# Patient Record
Sex: Male | Born: 1935
Health system: Southern US, Community
[De-identification: ages and names within clinical notes are randomized; demographics above are authoritative.]

## PROBLEM LIST (undated history)

## (undated) DIAGNOSIS — I700 Atherosclerosis of aorta: Secondary | ICD-10-CM

## (undated) DIAGNOSIS — Z9289 Personal history of other medical treatment: Secondary | ICD-10-CM

## (undated) DIAGNOSIS — Z8679 Personal history of other diseases of the circulatory system: Secondary | ICD-10-CM

## (undated) DIAGNOSIS — I6529 Occlusion and stenosis of unspecified carotid artery: Secondary | ICD-10-CM

## (undated) DIAGNOSIS — R1310 Dysphagia, unspecified: Secondary | ICD-10-CM

## (undated) DIAGNOSIS — I2510 Atherosclerotic heart disease of native coronary artery without angina pectoris: Secondary | ICD-10-CM

## (undated) DIAGNOSIS — E785 Hyperlipidemia, unspecified: Secondary | ICD-10-CM

## (undated) DIAGNOSIS — F419 Anxiety disorder, unspecified: Secondary | ICD-10-CM

## (undated) DIAGNOSIS — N2889 Other specified disorders of kidney and ureter: Secondary | ICD-10-CM

## (undated) DIAGNOSIS — G629 Polyneuropathy, unspecified: Secondary | ICD-10-CM

## (undated) DIAGNOSIS — I771 Stricture of artery: Secondary | ICD-10-CM

## (undated) DIAGNOSIS — I150 Renovascular hypertension: Secondary | ICD-10-CM

## (undated) HISTORY — PX: COLONOSCOPY: SHX174

## (undated) HISTORY — DX: Polyneuropathy, unspecified: G62.9

## (undated) HISTORY — DX: Atherosclerosis of aorta: I70.0

## (undated) HISTORY — DX: Hyperlipidemia, unspecified: E78.5

## (undated) HISTORY — DX: Occlusion and stenosis of unspecified carotid artery: I65.29

## (undated) HISTORY — PX: OTHER SURGICAL HISTORY: SHX169

## (undated) HISTORY — DX: Dysphagia, unspecified: R13.10

## (undated) HISTORY — DX: Personal history of other medical treatment: Z92.89

## (undated) HISTORY — DX: Other specified disorders of kidney and ureter: N28.89

## (undated) HISTORY — DX: Renovascular hypertension: I15.0

## (undated) HISTORY — DX: Atherosclerotic heart disease of native coronary artery without angina pectoris: I25.10

## (undated) HISTORY — DX: Anxiety disorder, unspecified: F41.9

## (undated) HISTORY — DX: Stricture of artery: I77.1

## (undated) HISTORY — DX: Personal history of other diseases of the circulatory system: Z86.79

---

## 1999-11-04 HISTORY — PX: CARDIAC CATHETERIZATION: SHX172

## 2000-08-19 ENCOUNTER — Inpatient Hospital Stay (HOSPITAL_COMMUNITY): Admission: EM | Admit: 2000-08-19 | Discharge: 2000-08-21 | Payer: Self-pay | Admitting: Emergency Medicine

## 2000-08-19 ENCOUNTER — Encounter: Payer: Self-pay | Admitting: Emergency Medicine

## 2000-09-08 ENCOUNTER — Encounter (HOSPITAL_COMMUNITY): Admission: RE | Admit: 2000-09-08 | Discharge: 2000-12-07 | Payer: Self-pay | Admitting: Cardiology

## 2000-12-08 ENCOUNTER — Encounter: Admission: RE | Admit: 2000-12-08 | Discharge: 2000-12-09 | Payer: Self-pay | Admitting: Cardiology

## 2001-02-17 ENCOUNTER — Ambulatory Visit (HOSPITAL_COMMUNITY): Admission: RE | Admit: 2001-02-17 | Discharge: 2001-02-18 | Payer: Self-pay | Admitting: Cardiology

## 2002-01-03 ENCOUNTER — Encounter: Admission: RE | Admit: 2002-01-03 | Discharge: 2002-01-03 | Payer: Self-pay | Admitting: Family Medicine

## 2002-01-03 ENCOUNTER — Encounter: Payer: Self-pay | Admitting: Family Medicine

## 2006-05-18 ENCOUNTER — Encounter: Admission: RE | Admit: 2006-05-18 | Discharge: 2006-05-18 | Payer: Self-pay | Admitting: Family Medicine

## 2006-05-21 ENCOUNTER — Encounter: Admission: RE | Admit: 2006-05-21 | Discharge: 2006-05-21 | Payer: Self-pay | Admitting: Family Medicine

## 2006-06-03 HISTORY — PX: RENAL ARTERY STENT: SHX2321

## 2006-06-09 ENCOUNTER — Emergency Department (HOSPITAL_COMMUNITY): Admission: EM | Admit: 2006-06-09 | Discharge: 2006-06-10 | Payer: Self-pay | Admitting: Emergency Medicine

## 2006-06-12 ENCOUNTER — Inpatient Hospital Stay (HOSPITAL_COMMUNITY): Admission: EM | Admit: 2006-06-12 | Discharge: 2006-06-14 | Payer: Self-pay | Admitting: Emergency Medicine

## 2006-06-18 ENCOUNTER — Ambulatory Visit (HOSPITAL_COMMUNITY): Admission: RE | Admit: 2006-06-18 | Discharge: 2006-06-19 | Payer: Self-pay | Admitting: Internal Medicine

## 2006-06-30 ENCOUNTER — Encounter: Admission: RE | Admit: 2006-06-30 | Discharge: 2006-06-30 | Payer: Self-pay | Admitting: Internal Medicine

## 2006-07-03 ENCOUNTER — Ambulatory Visit (HOSPITAL_COMMUNITY): Admission: RE | Admit: 2006-07-03 | Discharge: 2006-07-03 | Payer: Self-pay | Admitting: Internal Medicine

## 2006-09-01 ENCOUNTER — Ambulatory Visit (HOSPITAL_COMMUNITY): Admission: RE | Admit: 2006-09-01 | Discharge: 2006-09-01 | Payer: Self-pay | Admitting: Cardiology

## 2006-09-01 ENCOUNTER — Encounter: Payer: Self-pay | Admitting: Vascular Surgery

## 2006-10-01 ENCOUNTER — Ambulatory Visit (HOSPITAL_COMMUNITY): Admission: RE | Admit: 2006-10-01 | Discharge: 2006-10-01 | Payer: Self-pay | Admitting: Family Medicine

## 2007-06-03 ENCOUNTER — Encounter: Admission: RE | Admit: 2007-06-03 | Discharge: 2007-06-03 | Payer: Self-pay | Admitting: Family Medicine

## 2007-06-14 ENCOUNTER — Ambulatory Visit: Payer: Self-pay | Admitting: Surgery

## 2007-07-26 ENCOUNTER — Encounter: Admission: RE | Admit: 2007-07-26 | Discharge: 2007-07-26 | Payer: Self-pay | Admitting: Orthopedic Surgery

## 2007-07-28 ENCOUNTER — Ambulatory Visit (HOSPITAL_BASED_OUTPATIENT_CLINIC_OR_DEPARTMENT_OTHER): Admission: RE | Admit: 2007-07-28 | Discharge: 2007-07-28 | Payer: Self-pay | Admitting: Orthopedic Surgery

## 2008-03-08 ENCOUNTER — Inpatient Hospital Stay (HOSPITAL_COMMUNITY): Admission: EM | Admit: 2008-03-08 | Discharge: 2008-03-10 | Payer: Self-pay | Admitting: Emergency Medicine

## 2008-03-08 ENCOUNTER — Ambulatory Visit: Payer: Self-pay | Admitting: Emergency Medicine

## 2008-09-28 IMAGING — US US RENAL
1 series · 13 of 25 positions shown · non-contrast
Comparison: CT scan from 05/18/2006.

CLINICAL DATA: Left kidney mass seen on multiple prior CT and MRI studies. The
patient has renal insufficiency and is unable to take intravenous contrast which
has hindered assessment.
TECHNIQUE: Complete ultrasound examination of the urinary tract was performed
including evaluation of the kidneys, renal collecting systems, and urinary
bladder.

[Series 1: unknown · 0.23mm/px · 13 of 53 slices shown]
[im 1/53]
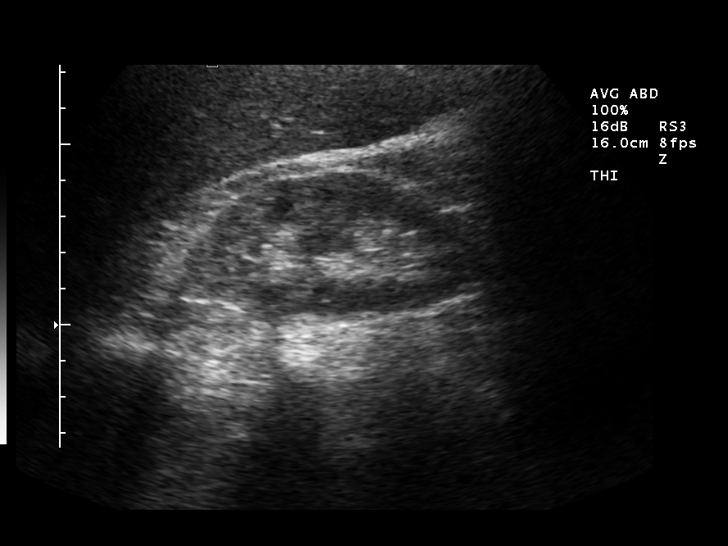
[im 5/53]
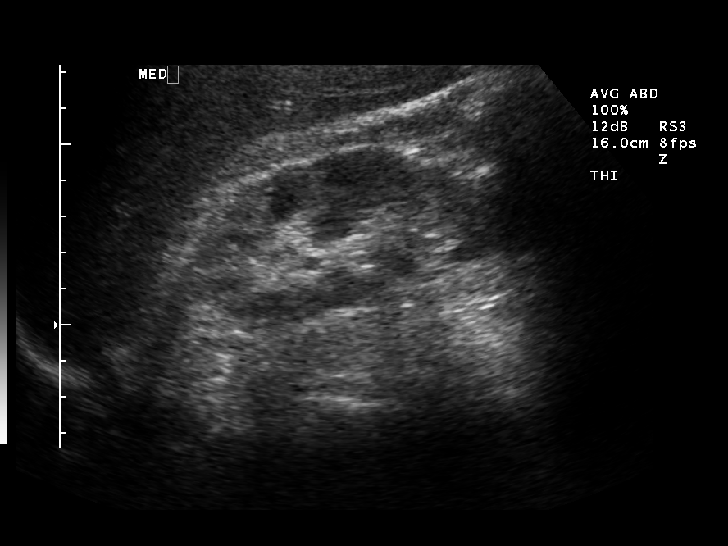
[im 9/53]
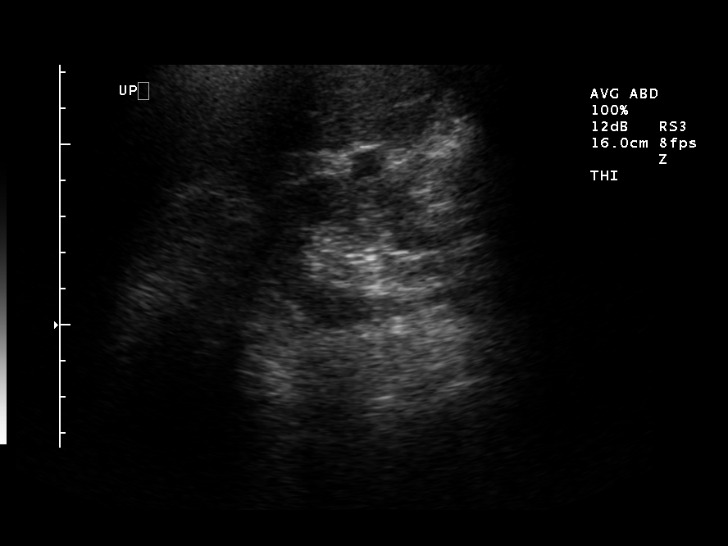
[im 14/53]
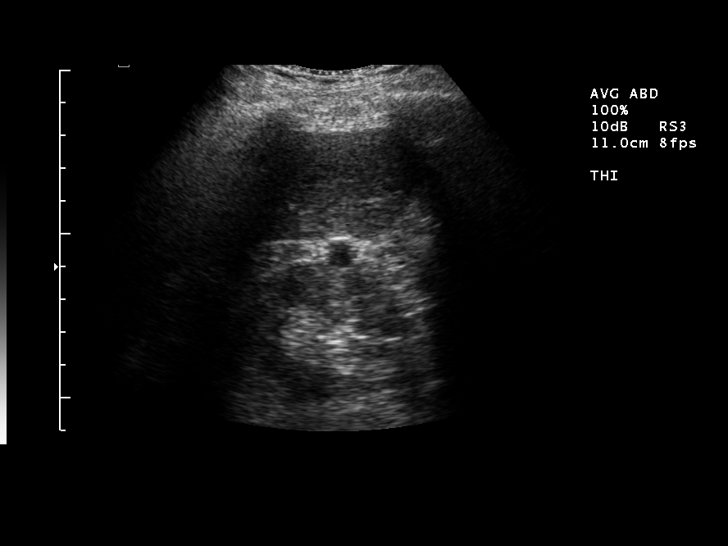
[im 18/53]
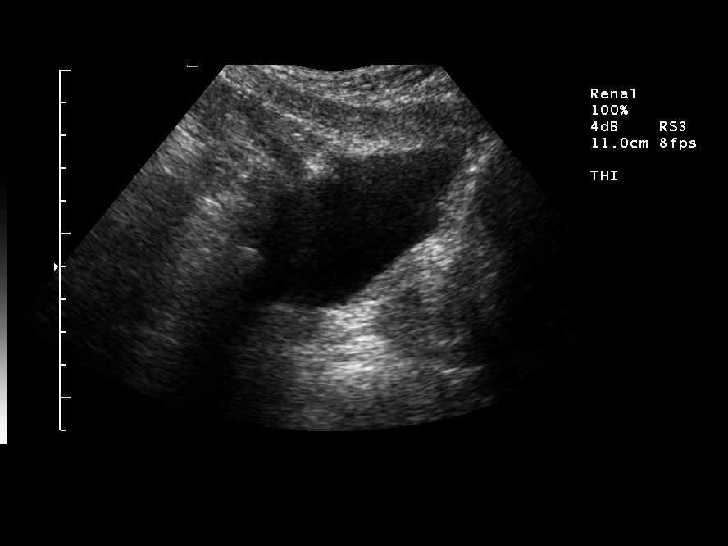
[im 22/53]
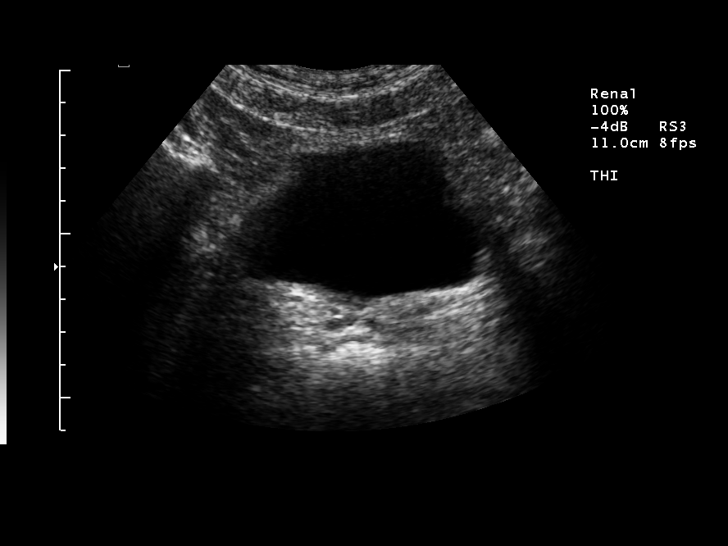
[im 27/53]
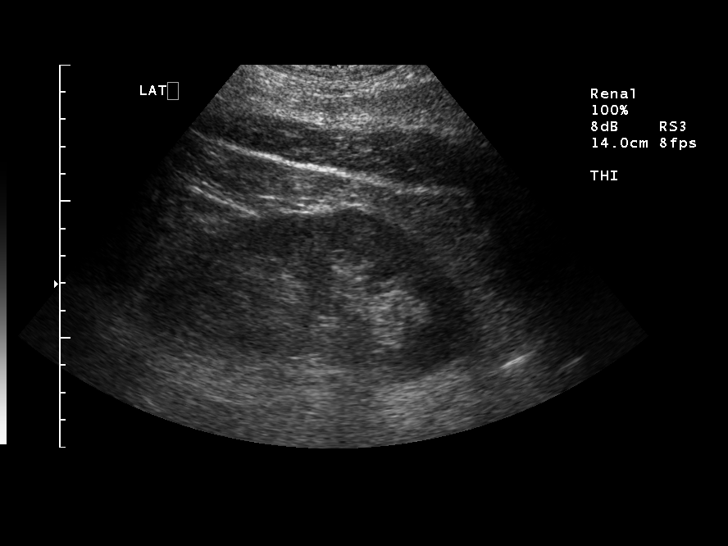
[im 31/53]
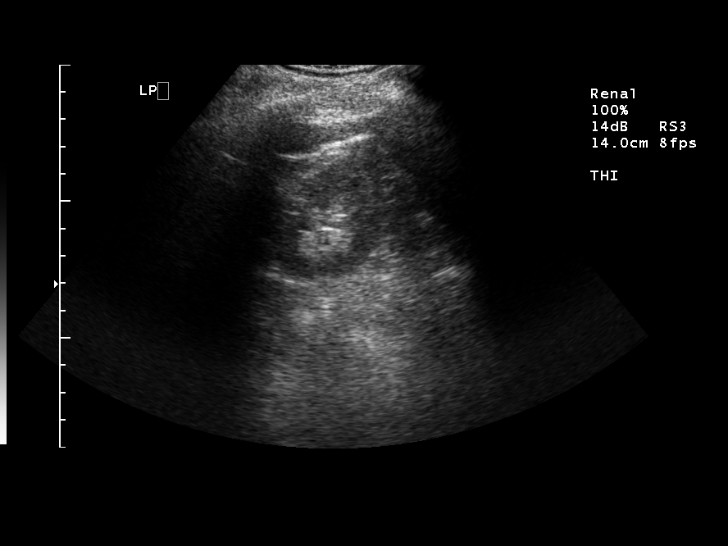
[im 35/53]
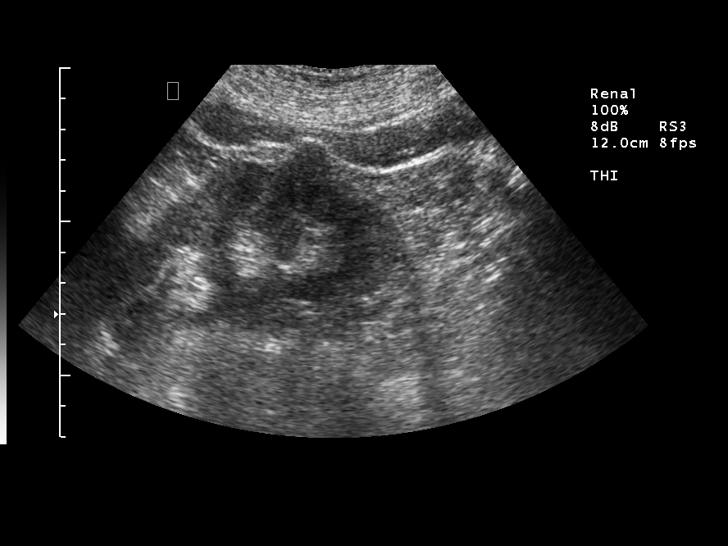
[im 40/53]
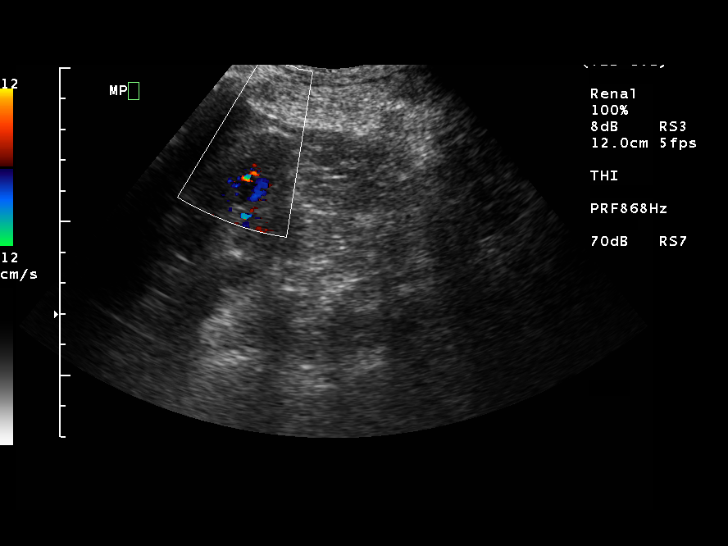
[im 44/53]
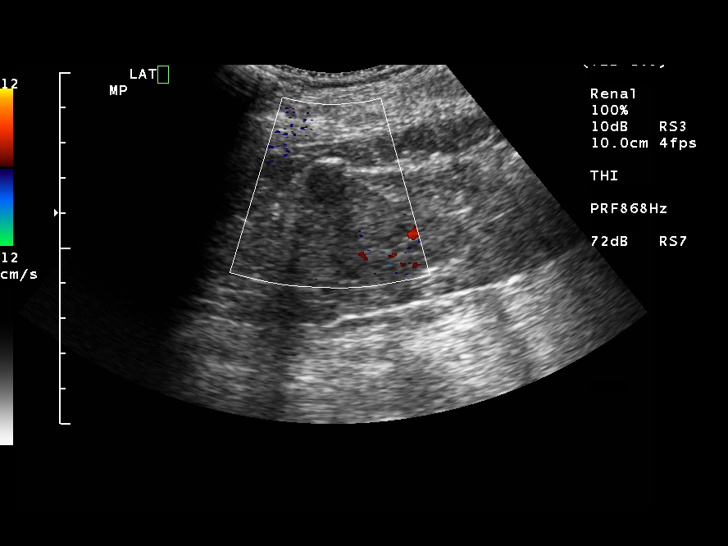
[im 48/53]
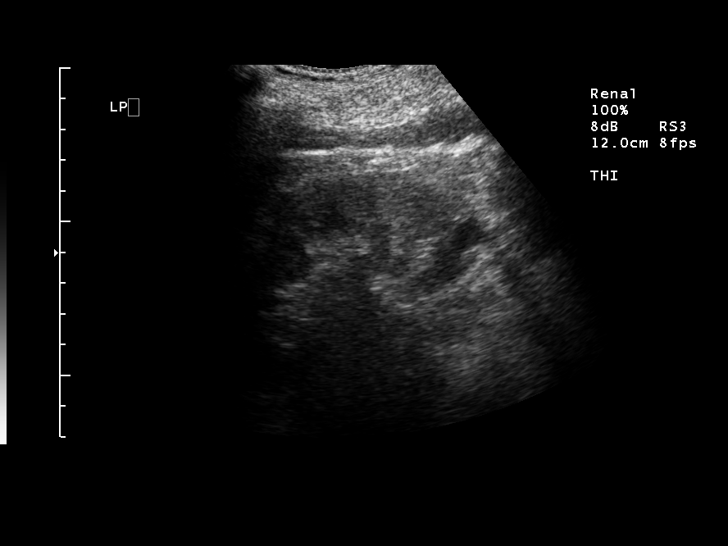
[im 53/53]
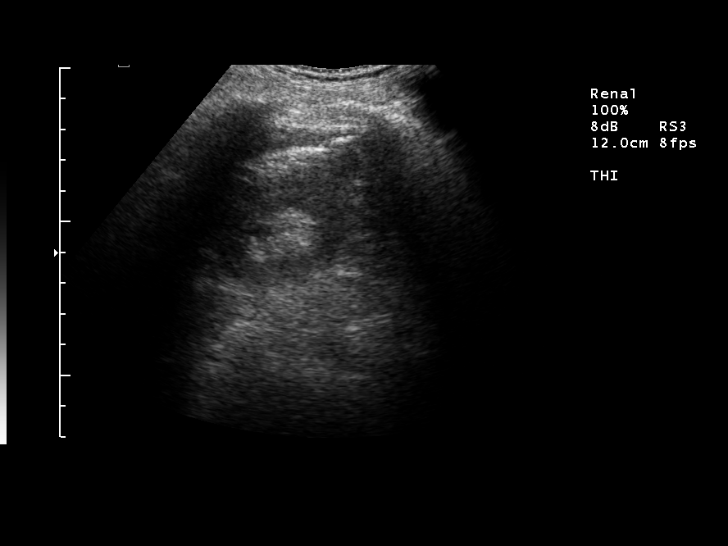

[13 of 25 positions shown; findings below may reference images not displayed]

MRI from 05/21/2006. A PET scan from
07/03/2006 is also been reviewed.

RENAL/URINARY TRACT ULTRASOUND:
FINDINGS: The right kidney measures 10.2 cm in long axis. A 0.9 cm cyst is
identified in the right kidney.

The left kidney measures 11.2 cm in long axis. A 1 cm hypoechoic lesion in the
lower pole left kidney is probably a cyst. The lesion in question is seen along
the lateral margin at the junction of the interpolar and lower pole regions. It
is only minimally hypoechoic relative to the renal parenchyma. The lesion is
well seen on today's study and measures approximately 1.3 cm which is precisely
the measurement report on the CT scan for 05/18/2006. Evaluation with Doppler
today demonstrates no appreciable flow signal within the lesion which is clearly
not hyperemic relative to the background renal parenchyma. However there is no
enhanced through-transmission to suggest that this is a complex cyst.

Midline imaging in the anatomic pelvis demonstrates a nondistended urinary
bladder. The prostate gland is enlarged.
IMPRESSION: 1.3 cm exophytic mass in the left kidney demonstrates no appreciable color
signal on Doppler imaging. However there is no enhanced through-transmission as
often seen in cysts complicated by proteinaceous debris or hemorrhage.
Currently, we have greater than 4 months of imaging followup, back to a CT scan
from 05/18/2006, and the lesion has been stable in that interval. As it is well
seen by ultrasound, a followup ultrasound exam could be performed in 8 to 12
months to ensure that this lesion remains unchanged.

## 2009-01-25 ENCOUNTER — Ambulatory Visit (HOSPITAL_COMMUNITY): Admission: RE | Admit: 2009-01-25 | Discharge: 2009-01-25 | Payer: Self-pay | Admitting: Urology

## 2009-07-02 ENCOUNTER — Encounter: Admission: RE | Admit: 2009-07-02 | Discharge: 2009-07-02 | Payer: Self-pay | Admitting: Cardiology

## 2009-07-12 ENCOUNTER — Ambulatory Visit (HOSPITAL_COMMUNITY): Admission: RE | Admit: 2009-07-12 | Discharge: 2009-07-12 | Payer: Self-pay | Admitting: Interventional Cardiology

## 2010-01-22 ENCOUNTER — Ambulatory Visit: Payer: Self-pay | Admitting: Urology

## 2010-11-24 ENCOUNTER — Encounter: Payer: Self-pay | Admitting: Family Medicine

## 2011-03-18 NOTE — Procedures (Signed)
CAROTID DUPLEX EXAM   INDICATION:  Followup Carotid artery disease.   HISTORY:  Diabetes:  No.  Cardiac:  Coronary artery disease, stent in 2001.  Hypertension:  Yes.  Smoking:  No.  Previous Surgery:  Renal artery PTA on 06/18/2006.  CV History:  Amaurosis Fugax No,  Paresthesias No, Hemiparesis No                                       RIGHT             LEFT  Brachial systolic pressure:         130               140  Brachial Doppler waveforms:         Triphasic         Triphasic  Vertebral direction of flow:        Antegrade         Antegrade  DUPLEX VELOCITIES (cm/sec)  CCA peak systolic                   110               143  ECA peak systolic                   231               191  ICA peak systolic                   101               106  ICA end diastolic                   25                25  PLAQUE MORPHOLOGY:                  Calcified         Calcified  PLAQUE AMOUNT:                      Moderate          Moderate  PLAQUE LOCATION:                    ICA, ECA          ICA, ECA   IMPRESSION:  Bilateral ECA stenoses .  20-39% bilateral ICA stenoses  Study essentially unchanged from 08/30/2004  A preliminary copy was faxed to Dr. Amil Amen office.   ___________________________________________  Janetta Hora. Fields, MD   DP/MEDQ  D:  06/14/2007  T:  06/15/2007  Job:  [id number]

## 2011-03-18 NOTE — Discharge Summary (Signed)
NAMEKRYSTLE, Edward [name] NO.:  [id number]   MEDICAL RECORD NO.:  [id number]          PATIENT TYPE:  INP   LOCATION:  1411                         FACILITY:  [facility]   PHYSICIAN:  Leslye Peer, MD    DATE OF BIRTH:  Jul 13, 1930   DATE OF ADMISSION:  03/08/2008  DATE OF DISCHARGE:  03/10/2008                               DISCHARGE SUMMARY   FINAL DIAGNOSIS:  Status post copperhead bite with envenomation.   LABORATORY DATA:  PTT 50, PT/INR 13.6/1, white blood cell count 5.9,  hemoglobin 10.9, hematocrit 31.2, platelet count 140, quantitive urinary  myoglobin less than 27.  DIC panel negative.   BRIEF HISTORY:  This is a very pleasant 77 year old white male who, the  morning of Mar 08, 2008, was working in his yard and sustained a  copperhead bite on his left index finger.  He developed immediate  swelling and ecchymosis and presented to the [facility] Emergency Room.   HOSPITAL COURSE:  1. Status post copperhead bite with envenomation.  Mr. Madan      presented to the emergency room due to significant hand swelling,      and mild hypotension, and nausea.  It was felt he was beginning to      have systemic effect of the of snake bite envenomation, therefore,      he was given a one-time dose of antivenom in the intensive care.      He was transferred emergently from the ER to the intensive care,      where he could be monitored closely for anaphylaxis.  He tolerated      antivenom infusion without difficulty.  He has had no post      envenomation sequelae.  Upon time of discharge,  his swelling has      improved, the ecchymosis has improved, and the limb mobility has      improved, and the pain has decreased.  He will be discharged to      home with p.r.n. followup with his primary physician.  He was      instructed to resume activity as tolerated, initiate antibiotic      therapy should he develop the following:  Worsening pain, red      streaking, discoloration  of the index finger, purulent discharge or      worsening swelling.  He was given a prescription of Cipro for this,      500 mg p.o. q.12h., and he is instructed to fill was only if these      symptoms were to occur, and then see his primary care physician      that day.  Upon time of discharge, Mr. Moquin appears to have met      maximum benefit from inpatient care.  He will be discharged to home      with followup with his primary care Cruz Bong as needed.   DISCHARGE INSTRUCTIONS:  Diet as tolerated.  Activity as tolerated.  Discharge medications as follows:  Aspirin 325 mg p.o. daily, BuSpar 15  mg twice a day, Toprol XL  100 mg, daily folic acid 400 mg daily, Vytorin  10/40 daily, clonidine 0.1 mg before bed, fish oil 1000 mg twice a day,  Co-Q10 100 mg twice a day.      Zenia Resides, NP      Leslye Peer, MD  Electronically Signed   PB/MEDQ  D:  03/10/2008  T:  03/10/2008  Job:  [id number]

## 2011-03-18 NOTE — H&P (Signed)
Edward [name], Edward [name] NO.:  [id number]   MEDICAL RECORD NO.:  [id number]          PATIENT TYPE:  INP   LOCATION:  1224                         FACILITY:  [facility]   PHYSICIAN:  Leslye Peer, MD    DATE OF BIRTH:  1930-06-05   DATE OF ADMISSION:  03/08/2008  DATE OF DISCHARGE:                              HISTORY & PHYSICAL   CHIEF COMPLAINTS:  Snake bite.   HISTORY OF PRESENT ILLNESS:  This is a very pleasant 77 year old white  male, patient who was out working in his yard at approximately 8:30 this  a.m., moving wood.  At approximately 8:45, he was bitten in the left  index finger by a copperhead.  This was verified by expert  identification, also the gentleman killed the snake and brought the  snake to the hospital for verification.  He reported to the emergency  room at approximately 9:30 a.m.  At that point, he had localized  swelling to the left index finger only.  Since that time, the arm was  elevated and immobilized.  The swelling was progressive with localized  edema noted up to the level of the left wrist, and extending into the  left middle finger.  He also had localized ecchymosis circumferential  around the left index finger, and painful paresthesia, including the  left index finger, the back of the left hand up to the level of the  wrist, and the left middle finger.  He has had mild complaint of nausea,  hypotension with systolic blood pressure down into the 80s, and again  marked throbbing discomfort to the left hand.  [employer]  was contacted, as well as an in depth literature review, including both  up-to-date medical website, and the [employer],  published June 03, 2001, by Lorn Junes, and [place].  The patient at  that point was turned over to the [facility]  Service.  Upon time of evaluation, the patient denies dyspnea, has had  some mild complaint of chest palpitations, has had mild  complaint of  nausea, complains of left hand pain as previously mentioned.  However,  no other pertinent positives.  Treatment today in the emergency room has  included tetanus booster, IV fluids, EKG demonstrating normal sinus  rhythm/sinus brady and arm immobilization.  Based on the recommendations  of up-to-date in the [place] Medical Journal, the arm was  immobilized below the level of the heart, and given evidence of of  systemic affective envenomation, it was decided to transfer him to the  intensive care where he could undergo antivenom administration.   PAST MEDICAL HISTORY:  1. Hypertension.  2. Hyperlipidemia.  3. Coronary artery disease, status post prior stenting.  4. Prior myocardial infarction.  5. Renal artery stenosis, status post stenting.  6. Anxiety.  7. Multiple environmental allergies.   SOCIAL HISTORY:  The gentleman is retired, he has never smoked, and does  not drink alcohol.  He is fully functional.   FAMILY HISTORY:  Not applicable.   ALLERGIES:  PENICILLIN AND ERYTHROMYCIN.   HOME MEDICATIONS:  1. Aspirin 325 mg p.o. daily.  2. BuSpar 15 mg p.o. b.i.d.  3. Toprol XL 100 mg daily.  4. Folic acid 400 mg daily.  5. Vytorin 10/40 daily.  6. Clonidine 0.1 mg b.i.d.  7. Fish oil.  8. Vitamin Q10.  9. Vitamin C.   REVIEW OF SYSTEMS:  Per HPI.   PHYSICAL EXAMINATION:  VITAL SIGNS:  Temperature 97.2, heart rate 60s,  normal sinus.  Blood pressure 102/56, as low as 80s/50s, respirations 16-  20, room air saturations 97%.  GENERAL:  This is a pleasant 77 year old white male, currently mildly  anxious but in no acute distress.  HEENT:  He is normocephalic.  The  mucous member are without petechiae.  There is no scleral hemorrhage and  no bleeding gums.  NECK:  There is no JVD or painful adenopathy.  PULMONARY:  Clear to auscultation.  CARDIAC:  Regular rate and rhythm.  ABDOMEN:  Soft, nontender.  NEUROLOGICALLY:  Grossly intact.  EXTREMITIES:   The left arm is currently immobilized below the level of  the heart.  He has localized ecchymosis circumferential around the left  index finger.  He has mild swelling, including the left middle finger,  and swelling up to the level of the left wrist.  He has marked  ecchymosis circumferentially around the left finger, as well as and  extending over the posterior portion of the left hand.  Additionally, he  has marked paresthesia, including palpation to the left index finger,  mildly to the left middle finger, and over the entire left posterior  portion of the hand.   LABORATORY DATA:  Sodium 138, potassium 4.8, chloride 105, CO2 of 27,  BUN 32, creatinine 1.56, glucose 114, AST 41, ALT 28.  Urinalysis  studies; urine protein and urine blood negative.  CPK total is 119 at  this time.  Fibrinogen 435, PTT is 32, PT/INR is 13/1.  Hemoglobin 12.5,  hematocrit 36.8, white blood cell count 5.5, platelet count 174.   IMPRESSION AND PLAN:  Status post acute copper head snake bite with  evidence of systemic envenomation exhibited by:  1. Local tissue damage, mild hypotension, and nausea.  He also has      marked ecchymosis.  The plan at this point will be to admit to the      intensive care, where he will undergo administration of antivenom,      we will monitor him closely for signs and symptoms of anaphylaxis,      we will treat him now systemically with IV fluid resuscitation, and      hold his antihypertensive regimen medications.  He will be closely      monitored in the intensive care.  The extremity will be marked q.1      h., for the next 12 hours, and we will continue to consult with      Poison Control for further recommendations.  2. History of coronary artery disease and hypertension.  Upon time of      initial evaluation, Mr. Klingensmith is currently mildly hypotensive.      Some concern about this being secondary envenomation.  Therefore,      will hold his maintenance  antihypertensive regimen, administer      antivenom, administer IV fluids, and monitor closely in the      intensive care setting.   BEST PRACTICE:  We will administer H2 blockade for gastric ulcer  prophylaxis.  Additionally, we will administer low molecular weight  heparin  for DVT prophylaxis.      Zenia Resides, NP      Leslye Peer, MD  Electronically Signed    PB/MEDQ  D:  03/08/2008  T:  03/08/2008  Job:  [id number]

## 2011-03-18 NOTE — Op Note (Signed)
NAMESPARROW, SANZO NO.:  [id number]   MEDICAL RECORD NO.:  [id number]          PATIENT TYPE:  AMB   LOCATION:  DSC                          FACILITY:  [facility]   PHYSICIAN:  Leonides Grills, M.D.     DATE OF BIRTH:  24-Feb-1930   DATE OF PROCEDURE:  07/28/2007  DATE OF DISCHARGE:                               OPERATIVE REPORT   PREOPERATIVE DIAGNOSIS:  Left tarsal tunnel syndrome.   POSTOPERATIVE DIAGNOSIS:  Left tarsal tunnel syndrome.   OPERATIONS:  Left tarsal tunnel release.   ANESTHESIA:  General.   SURGEON:  Leonides Grills, M.D.   ASSISTANT:  Evlyn Kanner, P.A.-C.   ESTIMATED BLOOD LOSS:  Minimal.   TOURNIQUET TIME:  Approximately 20 minutes.   COMPLICATIONS:  None.   DISPOSITION:  Stable to the PR.   INDICATIONS:  This is a 77 year old gentleman who has had burning  plantar foot pain with objective evidence of tarsal tunnel syndrome via  EMG that was interfering with his life.  He was consented for the above  procedure.  All risks including infection, nerve or vessel injury,  persistent pain, worsening pain, prolonged recovery, wound complication  were all explained, questions were encouraged and answered.   OPERATION:  The patient was brought to the operating room and placed in  the supine position. After adequate general endotracheal anesthesia was  administered as well as Ancef 1 gram IV piggyback, the patient was then  placed in a sloppy lateral position with the operative side down on a  beanbag.  All bony prominences were well padded.  The left lower  extremity was then prepped and draped in a sterile manner over a  proximally placed thigh tourniquet.  The limb was gravity exsanguinated.  The tourniquet was elevated to 290 mmHg.  A curvilinear incision was  made in the midline on the posteromedial aspect of the ankle.  Dissection was carried down through skin.  Hemostasis was obtained.  The  flexor retinaculum over the neurovascular bundle  was then released  completely as well as the medial portion of the abductor halluces  fascia, as well.  This had excellent release of the tight tarsal  tunnel.  The area was copiously irrigated with normal saline.  The  tourniquet was deflated.  Hemostasis was obtained.  There was no  pulsatile bleeding.  The subcu was closed with 3-0 Vicryl, the skin was  closed with 4-0 nylon.  A sterile dressing was applied, a Cam walker  boot was applied.  The patient was stable to the PR.      Leonides Grills, M.D.  Electronically Signed     PB/MEDQ  D:  07/28/2007  T:  07/28/2007  Job:  [id number]

## 2011-03-21 NOTE — H&P (Signed)
Edward [name], BAIL NO.:  [id number]   MEDICAL RECORD NO.:  [id number]          PATIENT TYPE:  INP   LOCATION:  0106                         FACILITY:  [facility]   PHYSICIAN:  Deirdre Peer. Polite, M.D. DATE OF BIRTH:  1929-11-14   DATE OF ADMISSION:  06/12/2006  DATE OF DISCHARGE:                                HISTORY & PHYSICAL   CHIEF COMPLAINT:  Hypertension, perioral numbness.   HISTORY OF PRESENT ILLNESS:  This 77 year old male with known history of  hypertension and MI in the past, presents to the ED. This is the second  visit in 2 days with complaints of elevated blood pressure and perioral  numbness.  Today patient woke up, did not feel too well, felt weak with no  energy. Throughout the day he began to have perioral numbness.  Upon taking  his blood pressure it was 230/80. The patient presented to the ED for  evaluation. The patient was treated with Clonidine with some improvement.  The patient had a CT of his head reported as negative. CBC within normal  limits. B-MET:  potassium 4.3, sodium 130, otherwise within normal limits.  Because of patient's return visit to the ED, elevation in blood pressure and  neuro symptoms, [facility] was called for further evaluation.  At the  time of my evaluation the patient was alert and oriented x3, in no apparent  distress and describing symptoms as stated above.  Admission was deemed  necessary for further evaluation and treatment.   PAST MEDICAL HISTORY:  1. Hypertension.  2. Myocardial infarction in 2001.  3. History of bilateral renal artery stenosis diagnosed approximately 10      years ago. Patient believes he had angioplasty on the right.   MEDICATIONS:  1. Aspirin 325 daily.  2. BuSpar 15 mg daily.  3. Toprol XL 50 mg daily.  4. Vytorin daily.  5. Folic acid daily.  6. Fish oil.  7. Hydrochlorothiazide 25 mg daily.   SOCIAL HISTORY:  Negative for tobacco, alcohol and drugs.   PAST SURGICAL  HISTORY:  None.   ALLERGIES:  PENICILLIN, ERYTHROMYCIN.   REVIEW OF SYSTEMS:  As stated above in history of present illness. Please  note, the patient has been on and off several blood pressure medications  over the last month or so which include Norvasc, Sular, and Benicar, he  stopped these because of side effects of leg swelling.   PHYSICAL EXAMINATION:  GENERAL:  The patient is alert and oriented x3.  VITAL SIGNS:  Temperature 98, blood pressure 185/80, pulse 57, respirations  18, saturation 97%.  HEENT:  Pupils are equal, round and reactive to light. Anicteric sclerae. No  oral lesions.  NECK:  No lymphadenopathy.  CHEST:  Clear.  CARDIOVASCULAR:  Regular.  ABDOMEN:  Soft, nontender, no hepatosplenomegaly.  EXTREMITIES:  No clubbing, cyanosis or edema.  NEURO EXAM:  Nonfocal. Cranial nerves II-XII intact. Motor exam 5 out of 5.  Deep tendon reflexes symmetrical.   DATA:  As stated in the HPI.   ASSESSMENT:  1. Hypertensive urgency.  2. Perioral numbness.  3.  History of renal artery stenosis.  4. History of myocardial infarction in 2001.   RECOMMENDATIONS:  Patient will be admitted to the nursing floor bed. Patient  will have an MRI of his head to rule out CVA. Will get better blood pressure  control and do a follow up B-MET in the a.m.  Will make further  recommendations as deemed necessary.      Deirdre Peer. Polite, M.D.  Electronically Signed     RDP/MEDQ  D:  06/12/2006  T:  06/12/2006  Job:  [id number]

## 2011-03-21 NOTE — Cardiovascular Report (Signed)
Edward [name]. [facility]  Patient:    Edward [name], Edward [name]                       MRN: [id number] Proc. Date: 02/17/01 Adm. Date:  [id number] Disc. Date: [id number] Attending:  Corliss Marcus CC:         Desma Maxim, M.D.  Cardiac Catheterization Laboratory   Cardiac Catheterization  PROCEDURES PERFORMED: 1. Left heart catheterization. 2. Coronary angiography. 3. Left ventriculogram. 4. Percutaneous transluminal coronary angioplasty/Cutting Balloon proximal    and mid right coronary artery.  INDICATIONS:  Edward [name] is a 77 year old man, who experienced a non-Q-wave myocardial infarction in October of 2001 with subsequent stent implantation in the proximal and midportion of the right coronary artery.  He did well until approximately one month ago when he had recurrent angina.  A recent myocardial perfusion study with exercise stress revealed a large reversible inferior defect, as well as ischemic electrocardiographic changes.  He is brought now to the catheterization laboratory to identify likely re-stenosis and provide for further therapeutic options.  DESCRIPTION OF PROCEDURE:  Left heart catheterization was performed following the percutaneous insertion of a 5 French catheter sheath utilizing an anterior approach over a guiding J wire into the right femoral artery.  A 110 cm pigtail catheter was used to measure pressures as well as perform left ventriculography in a 30 degree RAO angulation.  A total of 45 cc were injected utilizing the power injector.  The 5 [place], #4 left and right Judkins catheters were then used to perform coronary angiography. Cineangiography of each coronary artery was conducted in multiple LAO and RAO projections.  It was apparent there was re-stenosis within the stented segments of the right coronary, as well as just distal to the proximal stent.  We therefore proceeded with percutaneous intervention.  The 5 French catheter  sheath was exchanged over a long guiding J wire for a 7 French catheter sheath.  The patient received 4000 units of heparin intravenously, as well as a double bolus of Integrilin in constant infusion.  Initial ACT was 260 seconds. A 6 French catheter sheath was inserted percutaneously into the right femoral vein for intravenous access.  A 7 [place], FR4, Scimed, wiseguide guiding catheter was advanced to the ascending aorta where the right coronary os was engaged.  A 0.014 inch Scimed, luge, intracoronary guidewire was advanced across the lesion in the proximal and midportion of the right coronary. Initial balloon dilatation was performed with a 3.25/15 mm Scimed, Cutting Balloon.  A total of 6 inflations were performed to a maximum pressure of 8 atmospheres on the mid right coronary stent.  The balloon was then withdrawn to the lesion just distal to the proximal stent.  Three inflations were performed there to 6 atmospheres.  The balloon was then withdrawn further into the proximal portion of the proximal stent and inflated there to a maximum pressure of 10 atmospheres.  Each of these inflations were conducted for 1-1/2 minutes.  At that point, the 3.25/15 mm Cutting Balloon ruptured. It was removed and replaced with a 3.25/10 mm Cutting Balloon.  Three additional inflations were made to 10 atmospheres in the distal stent proximal portion, 1-1/2 minutes each.  Two additional inflations were made at the site of the most significant lesion to 10 atmospheres in the proximal portion of the right coronary artery and a final inflation was made at the ostium to 10 atmospheres with the 3.25/10 mm Cutting Balloon.  Following conformation of adequate patency in orthogonal views, both with and without the guidewire in place, the guiding catheter was removed.  Hemostasis was achieved by suturing the sheath in place.  The patient was transported to the recovery area in stable condition with intact  distal pulse.  A closing ACT was 244 seconds.  HEMODYNAMICS:  Systemic arterial pressure was 156/74 with a mean of 107 mmHg. There was no systolic gradient across the aortic valve.  The left ventricular end-diastolic pressure was 11 mmHg preventriculogram and 9 mmHg post ventriculogram.  ANGIOGRAPHY:  LEFT VENTRICULOGRAM:  The left ventriculogram demonstrated normal global systolic function, a calculated ejection fraction utilizing a single plane cine method of 70%.  The previously placed stents in the right coronary artery were easily visible as well as calcification present in the left coronary artery.  There was no significant mitral regurgitation.  There was a right dominant coronary system present.  The main left coronary artery was normal.  The left anterior descending artery and its branches were moderately diseased. diseased.  The vessel had a diffuse 50% proximal stenosis.  There were four diagonal branches which arose, all which were quite small in nature.  The ongoing LAD is diffusely diseased but without significant obstruction and reached the apex but did not traverse it.  The left circumflex artery and its branches were moderately diseased as well; the vessel had a proximal 40-50% stenosis, this was near the ostium.  There is diffuse disease in the midportion of the vessel after the origin of two very small marginal branches.  The third marginal branch was moderate in size and the forth marginal branch was large.  The ongoing circumflex in the AV groove was small.  It gives rise to a small posterolateral branch.  The degree of stenosis in the midportion of the left circumflex where it is diffusely diseased, is approximately 50%.  The right coronary artery and its branches were highly diseased.  The vessel demonstrated some focal and diffuse in-stent re-stenosis.  The proximal one-half of the proximal stent was 50% stenotic, then just at the distal end  of the  proximal stent, there was a focal 80% stenosis.  The stent placed in the midportion of the right coronary, there was diffuse in-stent re-stenosis of about 50%.  The ongoing vessel gave rise to a large posterior descending artery and a large posterolateral segment and branch.  The final posterolateral or left ventricular branch is moderate in size and there is a 40% stenosis at the origin of the posterior descending artery and at the origin of the large posterolateral branch.  Following Cutting Balloon dilatation in the right coronary artery, there was a 10% residual stenosis in the proximal portion at the site of the previous 80% stenosis only.  FINAL IMPRESSION: 1. Atherosclerotic coronary vascular disease, three-vessel. 2. Status post successful percutaneous transluminal coronary angioplasty,    Cutting Balloon proximal and mid right coronary artery. 3. No significant ischemia in the left circumflex or left anterior descending    distribution by recent Cardiolite. 4. Intact left ventricular size and global systolic function.  Ejection    fraction 70%. 5. Typical angina was produced during device insertion and balloon inflation. DD:  02/17/01 TD:  02/18/01 Job: [id number]

## 2011-03-21 NOTE — Cardiovascular Report (Signed)
[place]. [facility]  Patient:    Edward [name], Edward [name]                       MRN: [id number] Proc. Date: 08/20/00 Adm. Date:  [id number] Attending:  Corliss Marcus CC:         Desma Maxim, M.D.  Cardiac Catheterization Laboratory   Cardiac Catheterization  PROCEDURES PERFORMED: 1. Left heart catheterization. 2. Coronary angiography. 3. Left ventriculogram. 4. Percutaneous transluminal coronary angioplasty/stent implantation,    proximal and mid right coronary artery.  INDICATIONS:  Edward [name] is a 77 year old man who presented to my office on August 13, 2000, because of exertional throat discomfort.  He underwent an exercise treadmill test which was positive for significant ST segment changes, diagnostic for coronary ischemia.  He did not experience throat discomfort at this time.  Outpatient elective cardiac catheterization was arranged. However, on the early morning of August 19, 2000, the patient presented to [facility] Emergency Room via EMS with complaints of rest throat discomfort and mild dyspnea.  He was subsequently admitted with the diagnosis of unstable angina pectoris and has been treated with IV heparin and nitroglycerin.  A troponin level has been minimally positive at 0.67 for coronary ischemic injury.  A CK-MB remain normal.  He is brought to the cardiac catheterization laboratory at this time to identify the extent of disease and provide for further therapeutic options.  DESCRIPTION OF PROCEDURE:  Left heart catheterization was performed following the percutaneous insertion of a 6 French catheter sheath utilizing an anterior approach over a guiding J wire into the right femoral artery.  A 110 cm pigtail catheter was used to measure pressures in the ascending aorta and the a left ventricle as well as a left ventriculography in a 30 degree RAO angulation.  Coronary angiography was then performed using a 6 [place] #4 left and right Judkins  catheters.  Cineangiography of each coronary artery was conducted in multiple LAO and RAO projections.  We then proceeded with an intervention of the right coronary.  There was a proximal 60% stenosis with catheter "pressure damping" upon engagement.  There was a subtotal culprit lesion which was long and ulcerated in the midportion of the vessel.  A 7 French catheter sheath was exchanged over a long guiding J wire for a 6 French catheter sheath in the right femoral artery.  The patient received 4000 units of heparin intravenously which was 50 units per kilogram based on an initial ACT of 166 seconds.  He also received a double bolus of Integrilin in constant infusion.  A 7 French FR4 Sci-Med wiseguide with side holes guiding catheter was advanced to the ascending aorta where the right coronary os was engaged.  A .014 inch Sci-Med luge intracoronary guidewire was advanced across the lesion without difficulty.  Initial balloon dilatation was performed using a 3.0/20 mm Sci-Med Firefighter.  This was placed across the lesion and inflated to a peak pressure of 6 atmospheres for a peak duration of 67 seconds.  This balloon was removed and a 3.0/25 mm Sci-Med NIR Elite intracoronary stent was advanced across the lesion with some difficulty. It required deep intubation of the right coronary to facilitate advancing the stent in place.  It was ultimately deployed at a peak pressure of 14 atmospheres for 63 seconds.  Unfortunately at this point, there was trivial flow distally in the artery and the proximal dissection was recognized.  A 3.0/25 mm Sci-Med NIR  Elite intracoronary stent was placed across this lesion in the proximal portion of the right coronary such that it reached from the ostium to beyond the dissection.  It was initially deployed at 8 atmospheres for 49 seconds.  The balloon was returned once to the stent and positioned in the more proximal segment and inflated to 16  atmospheres for 34 seconds.  This restored excellent distal flow.  ST segment elevation which had been occurring resolved, and patients throat discomfort resolved as well.  Following conformation of adequate patency in orthogonal views both with and without the guidewire in place, the guiding catheter was removed.  Hemostasis was achieved by suturing the sheaths in place.  The patient was transported to the recovery area in stable condition with intact distal pulses.  After initial 4000 unit heparin bolus, the patients ACT was 321 falling to 281 at the completion of the procedure.  HEMODYNAMICS:  Systemic arterial pressure was 125/85 with a mean of 92 mmHg. There was no systolic gradient across the aortic valve.  The left ventricular end-diastolic pressure was 13 mmHg pre and post ventriculogram.  ANGIOGRAPHY:  LEFT VENTRICULOGRAM:  The left ventriculogram demonstrated normal left ventricular size and global systolic function with a calculated ejection fraction of 70% utilizing a plane cine method.  There was no mitral or aortic valvular calcification of significance.  There was right and left coronary calcification seen.  There was no mitral regurgitation.  CORONARY ANGIOGRAPHY:  There was a right dominant coronary system present.  The main left coronary artery was moderately diseased.  There was a distal tapering 30% stenosis.  The left anterior descending:  The left anterior descending artery and its branches were moderately diseased.  The vessel was diffusely diseased and shows a 40-50% diffuse mid vessel stenosis.  Four diagonal branches arise, all of which are quite small in nature.  The ongoing LAD is diffusely diseased but without significant obstruction in reach of the apex but does not traverse it.  Left circumflex:  The left circumflex artery and its branches were moderately diseased.  The vessel has a proximal 40-50% stenosis.  There is diffuse disease in the  midportion of the vessel after the origin of two very small  marginal branches.  The third marginal branch is moderate in size.  The fourth marginal branch is large.  The ongoing circumflex in the AV groove is small. It gives rise to a very small posterolateral branch.  The degree of stenosis in the midportion of the vessel where it is diffusely diseased, again is not greater than 40-50%.  Right coronary artery:  The right coronary artery and its branches are highly diseased.  As mentioned above, there is ostial 60-70% stenosis which caused pressure damping.  There is diffuse disease in the proximal and midportion, 30-40% obstructive, and in the midportion there is a long, ulcerated 90% stenosis.  The vessel then gives rise to a large posterior descending artery and a large posterolateral segment and branch.  There is a final second posterolateral or left ventricular branch which is moderate in size.  There is a 40% stenosis at the origin of the posterior descending artery and at the origin of the large first posterolateral branch.  Following balloon dilatation and stent implantation in the right coronary for the mid section there is no residual stenosis and there is a good "stepup and stepdown" entering and exiting the stent.  There is also wide patency of the proximal segment with no residual stenosis seen.  Collateral vessels were seen to the distal right coronary from the distal left circumflex distribution.  FINAL DIAGNOSES: 1. Atherosclerotic coronary vascular disease, moderate three-vessel. 2. Status post successful percutaneous transluminal coronary angioplasty    stent implantation in the proximal and mid right coronary artery. 3. Typical angina was reproduced with device insertion and balloon    inflation. 4. Intact left ventricular size and global systolic function without    regional wall motion abnormality noted. DD:  08/20/00 TD:  08/21/00 Job: [id number]

## 2011-03-21 NOTE — Consult Note (Signed)
Edward [name], Edward [name] NO.:  [id number]   MEDICAL RECORD NO.:  [id number]          PATIENT TYPE:  OIB   LOCATION:  3038                         FACILITY:  [facility]   PHYSICIAN:  Hollice Espy, M.D.DATE OF BIRTH:  28-Jun-1930   DATE OF CONSULTATION:  06/18/2006  DATE OF DISCHARGE:                                   CONSULTATION   PRIMARY CARE PHYSICIAN:  Donia Guiles, M.D.   REASON FOR CONSULTATION:  Hypertension and previous medical management.   HISTORY OF PRESENT ILLNESS:  The patient is a 77 year old white male well  known to [place] from a recent discharge with a past medical history of  hypertension, bilateral renal artery stenosis and lesion seen on right  kidney suspicious for carcinoma who presents as an overnight observation  following a interventional radiology procedure.  The patient was recently  discharged from the hospital for episodes of elevated high blood pressure.  At that time, he had Clonidine 0.1 p.o. b.i.d. added.  He had a MRA of his  kidneys ordered which the patient had completed prior to discharge.  Rather  he chose to go home given his hypotensive stability rather than wait another  hospital day for the results.  Unfortunately, the next day his results came  back positive for bilateral renal artery stenosis as well as a lesion seen  on his right kidney which again remained suspicious for renal cell  carcinoma.  I spoke with the patient's primary care physician as well as the  patient and discussed with interventional radiology and the patient was  scheduled for a bilateral renal artery stent placement following cerebral  angiogram today, June 18, 2006.  In addition, the patient is scheduled to  have a PET scan done on Monday, June 22, 2006.   The patient today met with Dr. Kearney Hard of interventional radiology and  underwent a renal angiogram and was found to have severely tight bilateral  renal artery stenosis.  He underwent bilateral  stent placement and  immediately his blood pressure started to improve with a blood pressure down  to 117/55.  The patient was monitored for several hours postprocedure and  did well.  He was getting ready to be discharged when a recheck of his blood  pressure found to be elevated.  He was found to have a blood pressure of  209/89.  He was also having symptoms consistent with previous episodes of  high blood pressure where he describes feelings of scalp tingling, oral  numbness and bilateral episodes of blurry vision.   The patient was given a dose of Clonidine 0.1 which his blood pressure  approximately 15 minutes later had come down to only about 198.  Continued  following of the patient with his blood pressure only coming down to another  10 systolic points.  It was felt that the patient likely had an episode of  high blood pressure from possible stress from procedure plus the generalized  stress and anxiety about high blood pressure and his renal lesion.  So, he  was kept in the hospital overnight for observation.  [facility] were  consulted for some general medical management.  The patient had his blood  pressure checked now several hours later and his blood pressure has been  coming down to a systolic in the 180s.  Currently, he is doing well.  He  says that his symptoms of blurry vision, scalp tingling and periorbital  numbness are starting to fade.  He is feeling much better.  He denies any  headaches, dysphagia, chest pain, palpitations, shortness of breath, wheeze,  cough, abdominal pain, hematuria, dysuria, constipation, diarrhea, focal  extremity numbness, weakness or pain.   REVIEW OF SYSTEMS:  Otherwise negative.   PAST MEDICAL HISTORY:  1. Hypertension.  2. Bilateral renal artery stenosis.  3. Lesion seen on the right kidney suspicious for carcinoma.   MEDICATIONS:  1. Currently on Clonidine 0.1 mg b.i.d.  2. BuSpar.  3. Aspirin 325 mg p.o. daily.  4. Toprol  100 mg p.o. daily.  5. Vytorin 10/40 p.o. daily.  6. Folic acid 1 mg p.o. daily.  7. Fish oil.  8. CO Q10.   ALLERGIES:  1. He has allergy to PENICILLIN.  2. ERYTHROMYCIN.  3. Because of his renal artery stenosis ACE INHIBITORS are relatively      contraindicated.   SOCIAL HISTORY:  No tobacco, alcohol or drug use.   FAMILY HISTORY:  Noncontributory.   PHYSICAL EXAMINATION:  VITAL SIGNS:  The patient's most recent set of vitals  show a systolic blood pressure 180/88, respirations 16, temperature  afebrile, pulse 76.  GENERAL:  The patient is alert and oriented x3 in no apparent distress.  HEENT:  Normocephalic and atraumatic.  Mucus membranes are moist.  He has no  carotid bruits.  HEART:  Regular rate and rhythm.  S1 and S2.  LUNGS:  Clear to auscultation bilaterally.  ABDOMEN:  Soft, nontender, and nondistended.  Positive bowel sounds.  I am  unable to appreciate any renal bruits.  EXTREMITIES:  No clubbing, cyanosis, or edema.   ASSESSMENT/PLAN:  1. Severe episode of systolic hypertension secondary to postprocedure      stent placement:  This may be a more stress response with management of      renal vessels and one would expect that his blood pressure should come      down naturally.  In the meantime, we will assess for an immediate stent      placement.  I would like for his blood pressure to stay below 150 so we      will give him an additional dose of Clonidine tonight and then put on      for p.r.n. orders.  He has been reasonably started on Ambien for stress      relief and sleeping management.  We will go ahead and give him one of      those as well.  2. Anxiety:  Continue BuSpar.  3. Hyperlipidemia.  4. Continue Vytorin.  5. Lesion seen on the right kidney:  We will plan for PET scan as      scheduled Monday, June 22, 2006.  In addition, if the patient's blood      pressure remains stable, we will plan for discharge tomorrow and follow     up with his primary  care physician.      Hollice Espy, M.D.  Electronically Signed     SKK/MEDQ  D:  06/18/2006  T:  06/18/2006  Job:  [id number]   cc:   Rolan Bucco L. Kearney Hard, M.D.  Donia Guiles, M.D.

## 2011-08-14 LAB — BASIC METABOLIC PANEL
BUN: 20
Creatinine, Ser: 1.38
Glucose, Bld: 118 — ABNORMAL HIGH

## 2011-08-14 LAB — POCT HEMOGLOBIN-HEMACUE: Operator id: 116011

## 2013-02-01 HISTORY — PX: OTHER SURGICAL HISTORY: SHX169

## 2013-08-04 ENCOUNTER — Other Ambulatory Visit: Payer: Self-pay | Admitting: Family Medicine

## 2013-08-04 DIAGNOSIS — R1310 Dysphagia, unspecified: Secondary | ICD-10-CM

## 2013-08-05 ENCOUNTER — Ambulatory Visit
Admission: RE | Admit: 2013-08-05 | Discharge: 2013-08-05 | Disposition: A | Discharge status: Home / Self Care | Payer: Medicare Other | Source: Ambulatory Visit | Attending: Family Medicine | Admitting: Family Medicine

## 2013-08-05 DIAGNOSIS — R1310 Dysphagia, unspecified: Secondary | ICD-10-CM

## 2013-08-30 ENCOUNTER — Telehealth: Payer: Self-pay | Admitting: Interventional Cardiology

## 2013-08-30 NOTE — Telephone Encounter (Signed)
New message     Want samples of benecar 40mg  and vytorin 10/40.  Pls call him and let him know if we have samples

## 2013-08-30 NOTE — Telephone Encounter (Signed)
Lm on pts machine letting him know that we do not have samples at this time and to call back and check next week.

## 2013-09-05 NOTE — Telephone Encounter (Signed)
Left samples of Vytorin at front desk per patient request, but explained that we did not have samples on Benicar.   Patient states that he has been taking an outdated Amlodipine 5mg  prescription as needed - that he has taken it 5 days straight. Dr. Eldridge Dace office note states that if patient didn't have good control that they may consider adding Amlodipine 2.5mg  and titrating up. Reported BPs:  10/28 am 133/63 10/29 am 125/67 10/30 am 128/59 10/31 am 161/79 11/01 am 135/63 11/02 am 130/60   pm 159/70  pm 141/66  pm 192/88  pm 164/75  pm 139/71  pm 144/74 Patient concerned about his evening pressures.  I explained Dr. Eldridge Dace is out of the office today. I will forward this to Dr. Eldridge Dace and his assist Amy Stegall for follow up with patient. Patient agreeable to plan.

## 2013-09-05 NOTE — Telephone Encounter (Signed)
Following up      Pt would like to renew AMLODIBINE to [facility] [address]    Pt also has concerns about his BP  193/83?  105/60 today.  Pt is also following up on samples BENICAR 40mg   BYTORIN 10/40 was told to call back on this.     Thanks!

## 2013-09-06 MED ORDER — AMLODIPINE BESYLATE 10 MG PO TABS: ORAL_TABLET | ORAL | Status: DC

## 2013-09-06 NOTE — Addendum Note (Signed)
Addended byOrlene Plum H on: 09/06/2013 01:33 PM   Modules accepted: Orders

## 2013-09-06 NOTE — Telephone Encounter (Signed)
Please call in Amlodipine 10 mg tablets.  He can start with a half tab daily.

## 2013-09-06 NOTE — Telephone Encounter (Signed)
Pt notified Rx sent in.

## 2013-09-08 ENCOUNTER — Encounter: Payer: Self-pay | Admitting: Sports Medicine

## 2013-09-08 ENCOUNTER — Ambulatory Visit (INDEPENDENT_AMBULATORY_CARE_PROVIDER_SITE_OTHER): Payer: Medicare Other | Admitting: Sports Medicine

## 2013-09-08 VITALS — BP 164/72 | HR 58 | Ht 70.25 in | Wt 165.0 lb

## 2013-09-08 DIAGNOSIS — M25579 Pain in unspecified ankle and joints of unspecified foot: Secondary | ICD-10-CM | POA: Insufficient documentation

## 2013-09-08 DIAGNOSIS — M205X9 Other deformities of toe(s) (acquired), unspecified foot: Secondary | ICD-10-CM

## 2013-09-08 DIAGNOSIS — M21619 Bunion of unspecified foot: Secondary | ICD-10-CM

## 2013-09-08 DIAGNOSIS — M7750 Other enthesopathy of unspecified foot: Secondary | ICD-10-CM

## 2013-09-08 DIAGNOSIS — M21629 Bunionette of unspecified foot: Secondary | ICD-10-CM | POA: Insufficient documentation

## 2013-09-08 DIAGNOSIS — M7741 Metatarsalgia, right foot: Secondary | ICD-10-CM

## 2013-09-08 NOTE — Assessment & Plan Note (Signed)
Because of his neuropathy we limited placing a metatarsal pad on the plantar surface of the left orthotic

## 2013-09-08 NOTE — Progress Notes (Signed)
  Subjective:    Patient ID: Edward [name], male    DOB: July 22, 1930, 79 y.o.   MRN: [id number]  HPI  Pleasant older man sent from Dr Philipp Deputy for new orthotics  Pt presents to clinic for evaluation of bilateral foot pain and numbness L > R.  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  Had a tarsal tunnel release of lt medial ankle that was not successful in 2007. Has custom orthotics, which have been helpful since 2008. 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   Hx of stents in renal arteries, and heart   Review of Systems     Objective:   Physical Exam  NAD  Foot exam:  Full sublux of lt 5th toe at MTP with overriding of 4th toe Bunionettes rt 5th MTP Small hammer on rt 4th toe Mild hallux deviation and short 1st phalanx bilat Mild supination on lt, neutral on rt  Drop of 3rd and 4th MT heads with abnormal calluses on LT Rt 4th MT head down but still mobile           Assessment & Plan:

## 2013-09-08 NOTE — Assessment & Plan Note (Signed)
The orthotics felt comfortable but if he gets pain over this area we will and at a later time a fifth ray post

## 2013-09-08 NOTE — Assessment & Plan Note (Signed)
He has a somewhat broad-based gait that I think is related to his peripheral neuropathy  I think he gets more foot breakdown because his sensation is not normal and that may account for some of his forefoot pain  His older orthotics are worn pretty badly and I did add some additional cushion to those  Orthotics were prepared and gave him a lot more support  Patient was fitted for a : standard, cushioned, semi-rigid orthotic. The orthotic was heated and afterward the patient stood on the orthotic blank positioned on the orthotic stand. The patient was positioned in subtalar neutral position and 10 degrees of ankle dorsiflexion in a weight bearing stance. After completion of molding, a stable base was applied to the orthotic blank. The blank was ground to a stable position for weight bearing. Size: 11 red EVA Base: blue EVA Posting: MT Additional orthotic padding: none Time 45 mins

## 2013-10-04 ENCOUNTER — Telehealth: Payer: Self-pay | Admitting: Interventional Cardiology

## 2013-10-04 NOTE — Telephone Encounter (Signed)
New problem    Pt called to request med samples of BYTORIN 10-40  Please give him a call back.

## 2013-10-05 NOTE — Telephone Encounter (Signed)
Pt notified that I have put # 28 samples of Vytorin 10-40 mg and Benicar 40 mg.

## 2013-12-28 ENCOUNTER — Ambulatory Visit: Payer: Self-pay | Admitting: Interventional Cardiology

## 2014-01-11 ENCOUNTER — Encounter: Payer: Self-pay | Admitting: Cardiology

## 2014-01-11 ENCOUNTER — Ambulatory Visit (INDEPENDENT_AMBULATORY_CARE_PROVIDER_SITE_OTHER): Payer: Medicare Other | Admitting: Interventional Cardiology

## 2014-01-11 ENCOUNTER — Encounter: Payer: Self-pay | Admitting: Interventional Cardiology

## 2014-01-11 VITALS — BP 140/62 | HR 53 | Ht 70.5 in | Wt 169.1 lb

## 2014-01-11 DIAGNOSIS — L98499 Non-pressure chronic ulcer of skin of other sites with unspecified severity: Secondary | ICD-10-CM

## 2014-01-11 DIAGNOSIS — I739 Peripheral vascular disease, unspecified: Secondary | ICD-10-CM | POA: Insufficient documentation

## 2014-01-11 DIAGNOSIS — E782 Mixed hyperlipidemia: Secondary | ICD-10-CM | POA: Insufficient documentation

## 2014-01-11 DIAGNOSIS — I7025 Atherosclerosis of native arteries of other extremities with ulceration: Secondary | ICD-10-CM | POA: Insufficient documentation

## 2014-01-11 DIAGNOSIS — I10 Essential (primary) hypertension: Secondary | ICD-10-CM | POA: Insufficient documentation

## 2014-01-11 DIAGNOSIS — I2510 Atherosclerotic heart disease of native coronary artery without angina pectoris: Secondary | ICD-10-CM | POA: Insufficient documentation

## 2014-01-11 DIAGNOSIS — F411 Generalized anxiety disorder: Secondary | ICD-10-CM | POA: Insufficient documentation

## 2014-01-11 DIAGNOSIS — J309 Allergic rhinitis, unspecified: Secondary | ICD-10-CM | POA: Insufficient documentation

## 2014-01-11 DIAGNOSIS — E783 Hyperchylomicronemia: Secondary | ICD-10-CM

## 2014-01-11 DIAGNOSIS — R609 Edema, unspecified: Secondary | ICD-10-CM

## 2014-01-11 DIAGNOSIS — N529 Male erectile dysfunction, unspecified: Secondary | ICD-10-CM

## 2014-01-11 DIAGNOSIS — G479 Sleep disorder, unspecified: Secondary | ICD-10-CM | POA: Insufficient documentation

## 2014-01-11 DIAGNOSIS — G609 Hereditary and idiopathic neuropathy, unspecified: Secondary | ICD-10-CM | POA: Insufficient documentation

## 2014-01-11 DIAGNOSIS — I6529 Occlusion and stenosis of unspecified carotid artery: Secondary | ICD-10-CM | POA: Insufficient documentation

## 2014-01-11 MED ORDER — ROSUVASTATIN CALCIUM 20 MG PO TABS: 20.0000 mg | ORAL_TABLET | Freq: Every day | ORAL | Status: DC

## 2014-01-11 NOTE — Patient Instructions (Signed)
Your physician has recommended you make the following change in your medication:   1. Stop Vytorin.  2. Start Crestor 20 mg 1 tablet daily.   Your physician recommends that you return for a FASTING lipid profile: 5.11.15  Your physician wants you to follow-up in: 1 year with Dr. Irish Lack. You will receive a reminder letter in the mail two months in advance. If you don't receive a letter, please call our office to schedule the follow-up appointment.

## 2014-01-11 NOTE — Progress Notes (Signed)
Patient ID: Edward [name], male   DOB: 1930/09/30, 80 y.o.   MRN: [id number]    [address] [place], [place], [place]  [address] Phone: [phone] Fax:  [phone]  Date:  01/11/2014   ID:  Edward [name], DOB 1930/08/01, MRN [id number]  PCP:  Mayra Neer, MD      History of Present Illness: Edward [name] is a 80 y.o. male with CAD and RAS. He had a normal stress test in February 2012. BP has been well controlled. He is tolerating meds well. He is active in his yard. He feels that he tires easily. He has not been limiting by throat tightness.  BP has been well controlled. Most readings at home are <361 systolic. Very few readings over 443 systolic. Home readings reviewed. He has some low readings and he feels tired at thise times. He holds the benicar. At other times, BP is higher and he takes the Benicar.  He has not been walking due to nerve damage in his feet. CAD/ASCVD:  Does some floor and balance exercises. c/o Dizziness when BP low.  Denies : Chest pain.  Leg edema.  Nitroglycerin.  Orthopnea.  Palpitations.  Paroxysmal nocturnal dyspnea.       Wt Readings from Last 3 Encounters:  01/11/14 169 lb 1.9 oz (76.712 kg)  09/08/13 165 lb (74.844 kg)     Past Medical History  Diagnosis Date  . Atherosclerosis of abdominal aorta     CT/abd and pelvis  . Hyperlipidemia   . Coronary artery disease     s/p BM stent, prox and mid RCA, 2001, cutting ballon RCA stenosis, 2002  . Renovascular hypertension     s./p. bilateral RA stent implant  . Carotid artery occlusion     bilateral carotid bruit, right greater then left -bilateral 40-50% stenosis, 12/26/09- no change  . Left kidney mass     lower pole, observing by urology- Dr. Reece Agar  . Chronic anxiety   . History of renal angiogram     9/10, showed patent renal stents, 30-40% instent restenosis ws the most severe lesion  . History of echocardiogram     2/11 echo EF 70%, mild MR, mildly elevated pulmonary  pressures 42 mmHg  . Peripheral neuropathy     in both feet from nerve compression   . Dysphagia     from esophageal dysmotility-tx with careful eating.     Current Outpatient Prescriptions  Medication Sig Dispense Refill  . amLODipine (NORVASC) 10 MG tablet Take 10 mg by mouth daily.       Marland Kitchen aspirin 325 MG tablet Take 325 mg by mouth daily.      . Calcium Carbonate-Vitamin D (CALCIUM PLUS VITAMIN D PO) Take by mouth.      . cloNIDine (CATAPRES) 0.1 MG tablet Take 1 tablet by mouth daily.       . Coenzyme Q10 (CO Q-10) 100 MG CAPS Take 100 mg by mouth daily.      Marland Kitchen ezetimibe-simvastatin (VYTORIN) 10-40 MG per tablet Take 1 tablet by mouth daily.      . folic acid (FOLVITE) 154 MCG tablet Take 400 mcg by mouth daily.      . hydrocortisone (ANUSOL-HC) 2.5 % rectal cream Place 1 application rectally 2 (two) times daily.      Marland Kitchen LORazepam (ATIVAN) 1 MG tablet Take 1 mg by mouth daily.       . metoprolol (LOPRESSOR) 50 MG tablet Take 50 mg by mouth 2 (  two) times daily.       Marland Kitchen olmesartan (BENICAR) 40 MG tablet Take 40 mg by mouth daily.      . Omega-3 Fatty Acids (FISH OIL) 1000 MG CAPS Take 1,000 mg by mouth 2 (two) times daily.       Marland Kitchen omeprazole (PRILOSEC) 20 MG capsule Take 20 mg by mouth daily.      . tamsulosin (FLOMAX) 0.4 MG CAPS capsule Take 1 capsule by mouth daily.      Marland Kitchen triamcinolone cream (KENALOG) 0.1 % as needed.       No current facility-administered medications for this visit.    Allergies:    Allergies  Allergen Reactions  . Erythromycin   . Penicillins   . Prednisone     Social History:  The patient  reports that he has never smoked. He has never used smokeless tobacco. He reports that he does not drink alcohol or use illicit drugs.   Family History:  The patient's family history is not on file.   ROS:  Please see the history of present illness.  No nausea, vomiting.  No fevers, chills.  No focal weakness.  No dysuria.    Mild swelling. All other systems reviewed  and negative.   PHYSICAL EXAM: VS:  BP 140/62  Pulse 53  Ht 5' 10.5" (1.791 m)  Wt 169 lb 1.9 oz (76.712 kg)  BMI 23.92 kg/m2 Well nourished, well developed, in no acute distress HEENT: normal Neck: no JVD, no carotid bruits Cardiac:  normal S1, S2; RRR;  Lungs:  clear to auscultation bilaterally, no wheezing, rhonchi or rales Abd: soft, nontender, no hepatomegaly Ext: no edema, 2+ PT pulses bilaterally Skin: warm and dry Neuro:   no focal abnormalities noted  EKG:  Sinus bradycardia, RBBB  ASSESSMENT AND PLAN:  Coronary atherosclerosis of native coronary artery  Continue Aspirin Tablet, 325 MG, 1 tablet, Orally, Once a day Continue Metoprolol Tartrate Tablet, 50 MG, 1 tablet, Orally, Twice a day Start Nitroglycerin 0.4 mg tablet, 0.4 mg, 1 tablet as directed, SL, as directed prn chest pain, 1 vial, Refills 6 IMAGING: EKG    Harward,Amy 12/29/2012 08:56:49 AM > Payslee Bateson,JAY 12/29/2012 09:32:30 AM > sinus bradycardia, RBBB   Notes: No angina. Stress test normal in Feb 2012.    2. Carotid Artery Disease  Stop Vytorin Tablet, 10-40 MG, 1 tablet, Orally, Once a day; due to interaction with amlodipine Start Crestor        Notes: Moderate disease by prior ultrasound. RICA 50-69%.   3. Atherosclerosis of renal artery  Continue Clonidine HCl tablet, 0.1mg , 1 tablet, once a day Continue Benicar Tablet, 40 MG, 1 tablet, Orally, Once a day Notes: BP well controlled on meds. He does not want to take amlodipine at this time. If readings stay in the same range, would continue current regimen. Tolerating amlodipine 5 mg. Increased exercise may lower BP as well.    4. HTN: BP well controlled. Reviewed. Range from 110-150.  Elevate legs for edema Preventive Medicine  Adult topics discussed:  Diet: healthy diet.  Exercise: 5 days a week, at least 30 minutes of aerobic exercise.      Signed, Mina Marble, MD, [facility] 01/11/2014 8:58 AM

## 2014-01-18 ENCOUNTER — Telehealth: Payer: Self-pay

## 2014-01-18 NOTE — Telephone Encounter (Signed)
Patient called to get samples of benicar, let patient know that I placed samples up front

## 2014-01-20 ENCOUNTER — Other Ambulatory Visit: Payer: Self-pay

## 2014-01-20 MED ORDER — METOPROLOL TARTRATE 50 MG PO TABS: 50.0000 mg | ORAL_TABLET | Freq: Two times a day (BID) | ORAL | Status: DC

## 2014-02-06 ENCOUNTER — Other Ambulatory Visit: Payer: Self-pay | Admitting: Cardiology

## 2014-02-06 DIAGNOSIS — I6529 Occlusion and stenosis of unspecified carotid artery: Secondary | ICD-10-CM

## 2014-02-07 ENCOUNTER — Encounter: Payer: Self-pay | Admitting: Cardiovascular Disease

## 2014-02-07 ENCOUNTER — Ambulatory Visit (HOSPITAL_COMMUNITY): Payer: Medicare Other | Attending: Cardiovascular Disease | Admitting: Cardiology

## 2014-02-07 DIAGNOSIS — I6529 Occlusion and stenosis of unspecified carotid artery: Secondary | ICD-10-CM | POA: Insufficient documentation

## 2014-02-07 NOTE — Progress Notes (Signed)
Carotid duplex completed 

## 2014-02-14 ENCOUNTER — Telehealth: Payer: Self-pay | Admitting: Cardiology

## 2014-02-14 DIAGNOSIS — I6529 Occlusion and stenosis of unspecified carotid artery: Secondary | ICD-10-CM

## 2014-02-14 NOTE — Telephone Encounter (Signed)
furture carotid ordered.

## 2014-02-14 NOTE — Telephone Encounter (Signed)
Message copied by Autumn Messing Yaser Harvill H on Tue Feb 14, 2014  8:12 AM ------      Message from: Jettie Booze      Created: Wed Feb 08, 2014 12:26 PM       Stable carotid disease. Repeat scan in 6 months.  Refer to Dr. Gwenlyn Found for subclavian disease. ------

## 2014-03-10 ENCOUNTER — Encounter: Payer: Self-pay | Admitting: Cardiovascular Disease

## 2014-03-10 ENCOUNTER — Ambulatory Visit (INDEPENDENT_AMBULATORY_CARE_PROVIDER_SITE_OTHER): Payer: Medicare Other | Admitting: Cardiovascular Disease

## 2014-03-10 ENCOUNTER — Encounter (HOSPITAL_COMMUNITY): Payer: Self-pay | Admitting: *Deleted

## 2014-03-10 VITALS — BP 164/76 | HR 48 | Ht 72.0 in | Wt 165.0 lb

## 2014-03-10 DIAGNOSIS — I701 Atherosclerosis of renal artery: Secondary | ICD-10-CM | POA: Insufficient documentation

## 2014-03-10 DIAGNOSIS — Z9889 Other specified postprocedural states: Secondary | ICD-10-CM

## 2014-03-10 DIAGNOSIS — I6529 Occlusion and stenosis of unspecified carotid artery: Secondary | ICD-10-CM

## 2014-03-10 DIAGNOSIS — Z9862 Peripheral vascular angioplasty status: Secondary | ICD-10-CM

## 2014-03-10 NOTE — Patient Instructions (Signed)
Your physician has requested that you have a renal artery duplex. During this test, an ultrasound is used to evaluate blood flow to the kidneys. Allow one hour for this exam. Do not eat after midnight the day before and avoid carbonated beverages. Take your medications as you usually do.  Dr Gwenlyn Found recommends that you follow-up with him on an as needed basis.

## 2014-03-10 NOTE — Assessment & Plan Note (Signed)
Patient has a history of moderate bilateral internal carotid stenosis by 2+ ultrasound was recently performed 02/07/14. He did have incidentally noted a high-grade right greater than left subclavian artery stenosis with antegrade vertebral flow bilaterally. He denies symptoms of upper extremity claudication or subclavian steal. Based on this recommendation is conservative therapy. We'll continue to follow him by duplex ultrasound. Should he become symptomatic in the future, an invasive evaluation and potential percutaneous recatheterization would be an option.

## 2014-03-10 NOTE — Progress Notes (Signed)
03/10/2014 Edward [name]   1930-03-09  [id number]  Primary Physician Mayra Neer, MD Primary Cardiologist: Lorretta Harp MD Renae Gloss   HPI:  [name] is a delightful 80 year old married Caucasian male father of 2 children, grandfather of 5 grandchildren who worked at [employer] heart in the past. His primary care physician is Dr. Serita Grammes. He was referred by Dr. Casandra Doffing for peripheral vascular evaluation. He has a history of treated hypertension and hyperlipidemia. He has never smoked. Her father did have A. Myocardial infarction in his 72s. He had an myocardial function in October of 2001 and had 2 NIR bare metal stents placed in his RCA by Dr. Quay Burow 08/20/00. He has had renal artery stenting bilaterally performed by Dr. Lennette Bihari for 06/18/06. He had carotid Dopplers performed recently that showed moderate bilateral internal carotid artery stenosis with incidentally noted bilateral subclavian artery disease. His vertebral flow was antegrade bilaterally. He is totally asymptomatic.   Current Outpatient Prescriptions  Medication Sig Dispense Refill  . amLODipine (NORVASC) 10 MG tablet Take 5 mg by mouth daily.       Marland Kitchen aspirin 325 MG tablet Take 325 mg by mouth daily.      . Calcium Carbonate-Vitamin D (CALCIUM PLUS VITAMIN D PO) Take by mouth.      . cloNIDine (CATAPRES) 0.1 MG tablet Take 1 tablet by mouth daily.       . Coenzyme Q10 (CO Q-10) 100 MG CAPS Take 100 mg by mouth daily.      . folic acid (FOLVITE) 419 MCG tablet Take 400 mcg by mouth daily.      . hydrocortisone (ANUSOL-HC) 2.5 % rectal cream Place 1 application rectally 2 (two) times daily.      Marland Kitchen LORazepam (ATIVAN) 1 MG tablet Take 1 mg by mouth 2 (two) times daily.       . metoprolol (LOPRESSOR) 50 MG tablet Take 1 tablet (50 mg total) by mouth 2 (two) times daily.  60 tablet  6  . olmesartan (BENICAR) 40 MG tablet Take 40 mg by mouth daily.      . Omega-3 Fatty Acids (FISH OIL) 1000 MG  CAPS Take 1,000 mg by mouth 2 (two) times daily.       Marland Kitchen omeprazole (PRILOSEC) 20 MG capsule Take 20 mg by mouth daily.      . rosuvastatin (CRESTOR) 20 MG tablet Take 1 tablet (20 mg total) by mouth daily.  90 tablet  3  . tamsulosin (FLOMAX) 0.4 MG CAPS capsule Take 1 capsule by mouth daily.      Marland Kitchen triamcinolone cream (KENALOG) 0.1 % as needed.       No current facility-administered medications for this visit.    Allergies  Allergen Reactions  . Erythromycin   . Penicillins   . Prednisone     History   Social History  . Marital Status: Married    Spouse Name: N/A    Number of Children: N/A  . Years of Education: N/A   Occupational History  . Not on file.   Social History Main Topics  . Smoking status: Never Smoker   . Smokeless tobacco: Never Used  . Alcohol Use: No  . Drug Use: No  . Sexual Activity: Not on file   Other Topics Concern  . Not on file   Social History Narrative  . No narrative on file     Review of Systems: General: negative for chills, fever, night sweats or weight changes.  Cardiovascular: negative for chest pain, dyspnea on exertion, edema, orthopnea, palpitations, paroxysmal nocturnal dyspnea or shortness of breath Dermatological: negative for rash Respiratory: negative for cough or wheezing Urologic: negative for hematuria Abdominal: negative for nausea, vomiting, diarrhea, bright red blood per rectum, melena, or hematemesis Neurologic: negative for visual changes, syncope, or dizziness All other systems reviewed and are otherwise negative except as noted above.    Blood pressure 164/76, pulse 48, height 6' (1.829 m), weight 165 lb (74.844 kg).  General appearance: alert and no distress Neck: no adenopathy, no JVD, supple, symmetrical, trachea midline, thyroid not enlarged, symmetric, no tenderness/mass/nodules and bilateral carotid and bilateral subclavian bruits Lungs: clear to auscultation bilaterally Heart: regular rate and rhythm,  S1, S2 normal, no murmur, click, rub or gallop Extremities: extremities normal, atraumatic, no cyanosis or edema and 2+ pedal pulses bilaterally  EKG not performed today  ASSESSMENT AND PLAN:   Occlusion and stenosis of carotid artery without mention of cerebral infarction Patient has a history of moderate bilateral internal carotid stenosis by 2+ ultrasound was recently performed 02/07/14. He did have incidentally noted a high-grade right greater than left subclavian artery stenosis with antegrade vertebral flow bilaterally. He denies symptoms of upper extremity claudication or subclavian steal. Based on this recommendation is conservative therapy. We'll continue to follow him by duplex ultrasound. Should he become symptomatic in the future, an invasive evaluation and potential percutaneous recatheterization would be an option.  Renal artery stenosis The patient has a history of bilateral renal artery stenting by Dr. Rolm Baptise 06/18/06 for hypertension. Unfortunately this did not improve his blood pressure. He has not had these assessed by duplex ultrasound since. I will recheck a renal Doppler study.      Lorretta Harp MD FACP,FACC,FAHA, [facility] 03/10/2014 12:49 PM

## 2014-03-10 NOTE — Assessment & Plan Note (Addendum)
The patient has a history of bilateral renal artery stenting by Dr. Rolm Baptise 06/18/06 for hypertension. Unfortunately this did not improve his blood pressure. He has not had these assessed by duplex ultrasound since. I will recheck a renal Doppler study.

## 2014-03-13 ENCOUNTER — Ambulatory Visit (INDEPENDENT_AMBULATORY_CARE_PROVIDER_SITE_OTHER): Payer: Medicare Other | Admitting: *Deleted

## 2014-03-13 DIAGNOSIS — E782 Mixed hyperlipidemia: Secondary | ICD-10-CM

## 2014-03-13 LAB — HEPATIC FUNCTION PANEL
ALT: 21 U/L (ref 0–53)
AST: 30 U/L (ref 0–37)
Albumin: 3.6 g/dL (ref 3.5–5.2)
Alkaline Phosphatase: 70 U/L (ref 39–117)
Bilirubin, Direct: 0.1 mg/dL (ref 0.0–0.3)
Total Bilirubin: 0.5 mg/dL (ref 0.2–1.2)
Total Protein: 7 g/dL (ref 6.0–8.3)

## 2014-03-13 LAB — LIPID PANEL
CHOLESTEROL: 110 mg/dL (ref 0–200)
HDL: 40.5 mg/dL (ref >39.00)
LDL CALC: 51 mg/dL (ref 0–99)
Total CHOL/HDL Ratio: 3
Triglycerides: 91 mg/dL (ref 0.0–149.0)
VLDL: 18.2 mg/dL (ref 0.0–40.0)

## 2014-03-15 ENCOUNTER — Telehealth: Payer: Self-pay

## 2014-03-15 NOTE — Telephone Encounter (Signed)
Patient called for samples of benicar 40 mg placed samples up front 

## 2014-03-17 ENCOUNTER — Ambulatory Visit (HOSPITAL_COMMUNITY)
Admission: RE | Admit: 2014-03-17 | Discharge: 2014-03-17 | Disposition: A | Discharge status: Home / Self Care | Payer: Medicare Other | Source: Ambulatory Visit | Attending: Cardiovascular Disease | Admitting: Cardiovascular Disease

## 2014-03-17 DIAGNOSIS — Z48812 Encounter for surgical aftercare following surgery on the circulatory system: Secondary | ICD-10-CM | POA: Insufficient documentation

## 2014-03-17 DIAGNOSIS — I701 Atherosclerosis of renal artery: Secondary | ICD-10-CM

## 2014-03-17 DIAGNOSIS — I10 Essential (primary) hypertension: Secondary | ICD-10-CM

## 2014-03-17 DIAGNOSIS — Z9862 Peripheral vascular angioplasty status: Secondary | ICD-10-CM

## 2014-03-17 NOTE — Progress Notes (Signed)
Renal Duplex Completed. Mariana Goytia, BS, RDMS, RVT  

## 2014-03-20 ENCOUNTER — Telehealth: Payer: Self-pay | Admitting: Interventional Cardiology

## 2014-03-20 NOTE — Telephone Encounter (Signed)
Lipids , liver controlled.  I think Edward [name] follows him...former [facility] patient

## 2014-03-21 ENCOUNTER — Encounter: Payer: Self-pay | Admitting: *Deleted

## 2014-03-21 NOTE — Progress Notes (Signed)
Pts wife notified. See telephone note.

## 2014-03-21 NOTE — Telephone Encounter (Signed)
Pt's wife notified.

## 2014-03-21 NOTE — Telephone Encounter (Signed)
Cholesterol well controlled, and Dr. Serita Grammes now manages.  She will recheck this again in a few months as already scheduled in [place].  No change in meds needed.

## 2014-03-22 ENCOUNTER — Telehealth: Payer: Self-pay

## 2014-03-22 NOTE — Telephone Encounter (Signed)
CALL PATIENT TO LET HIM KNOW THAT I PLACED BENICAR 40 MG UP FRONT FOR HIM

## 2014-04-10 ENCOUNTER — Other Ambulatory Visit: Payer: Self-pay

## 2014-04-10 MED ORDER — AMLODIPINE BESYLATE 10 MG PO TABS: 5.0000 mg | ORAL_TABLET | Freq: Every day | ORAL | Status: DC

## 2014-05-09 ENCOUNTER — Telehealth: Payer: Self-pay | Admitting: *Deleted

## 2014-05-09 ENCOUNTER — Telehealth: Payer: Self-pay | Admitting: Interventional Cardiology

## 2014-05-09 NOTE — Telephone Encounter (Signed)
error 

## 2014-05-09 NOTE — Telephone Encounter (Signed)
Patient requests crestor and benicar samples. I will place at the front desk for pick up.

## 2014-05-22 ENCOUNTER — Telehealth: Payer: Self-pay

## 2014-05-22 NOTE — Telephone Encounter (Signed)
Patient called to get samples of benicar 40 mg placed samples up frodnt

## 2014-08-23 ENCOUNTER — Telehealth: Payer: Self-pay

## 2014-08-23 NOTE — Telephone Encounter (Signed)
Patient called for samples of benicar placed 2 boxes up front

## 2014-08-25 ENCOUNTER — Other Ambulatory Visit: Payer: Self-pay

## 2014-08-25 MED ORDER — METOPROLOL TARTRATE 50 MG PO TABS: 50.0000 mg | ORAL_TABLET | Freq: Two times a day (BID) | ORAL | Status: DC

## 2014-08-28 ENCOUNTER — Encounter (HOSPITAL_COMMUNITY): Payer: Medicare Other

## 2014-08-31 ENCOUNTER — Ambulatory Visit (HOSPITAL_COMMUNITY): Payer: Medicare Other | Attending: Cardiology | Admitting: Cardiology

## 2014-08-31 DIAGNOSIS — I6523 Occlusion and stenosis of bilateral carotid arteries: Secondary | ICD-10-CM

## 2014-08-31 DIAGNOSIS — I6529 Occlusion and stenosis of unspecified carotid artery: Secondary | ICD-10-CM | POA: Diagnosis present

## 2014-08-31 DIAGNOSIS — G458 Other transient cerebral ischemic attacks and related syndromes: Secondary | ICD-10-CM

## 2014-08-31 NOTE — Progress Notes (Signed)
Carotid duplex performed 

## 2014-09-01 ENCOUNTER — Telehealth: Payer: Self-pay | Admitting: *Deleted

## 2014-09-01 ENCOUNTER — Encounter: Payer: Self-pay | Admitting: *Deleted

## 2014-09-01 DIAGNOSIS — I779 Disorder of arteries and arterioles, unspecified: Secondary | ICD-10-CM

## 2014-09-01 DIAGNOSIS — I739 Peripheral vascular disease, unspecified: Principal | ICD-10-CM

## 2014-09-01 NOTE — Telephone Encounter (Signed)
Order placed for repeat carotid dopplers in 1 year  

## 2014-09-01 NOTE — Telephone Encounter (Signed)
Message copied by Chauncy Lean on Fri Sep 01, 2014  5:03 PM ------      Message from: Lorretta Harp      Created: Fri Sep 01, 2014 11:27 AM       No change from prior study. Repeat in 12 months. ------

## 2014-10-05 ENCOUNTER — Telehealth: Payer: Self-pay | Admitting: *Deleted

## 2014-10-05 NOTE — Telephone Encounter (Signed)
Crestor and benicar samples placed at the front desk for patient.

## 2014-11-14 ENCOUNTER — Ambulatory Visit (HOSPITAL_BASED_OUTPATIENT_CLINIC_OR_DEPARTMENT_OTHER): Payer: Medicare Other

## 2014-11-17 ENCOUNTER — Encounter: Payer: Self-pay | Admitting: Cardiovascular Disease

## 2014-11-17 ENCOUNTER — Ambulatory Visit (INDEPENDENT_AMBULATORY_CARE_PROVIDER_SITE_OTHER): Payer: Medicare Other | Admitting: Cardiovascular Disease

## 2014-11-17 VITALS — BP 152/86 | HR 49 | Ht 70.25 in | Wt 169.0 lb

## 2014-11-17 DIAGNOSIS — I739 Peripheral vascular disease, unspecified: Secondary | ICD-10-CM

## 2014-11-17 DIAGNOSIS — I10 Essential (primary) hypertension: Secondary | ICD-10-CM

## 2014-11-17 DIAGNOSIS — L98499 Non-pressure chronic ulcer of skin of other sites with unspecified severity: Secondary | ICD-10-CM

## 2014-11-17 DIAGNOSIS — I2510 Atherosclerotic heart disease of native coronary artery without angina pectoris: Secondary | ICD-10-CM

## 2014-11-17 DIAGNOSIS — E782 Mixed hyperlipidemia: Secondary | ICD-10-CM

## 2014-11-17 DIAGNOSIS — I7025 Atherosclerosis of native arteries of other extremities with ulceration: Secondary | ICD-10-CM

## 2014-11-17 NOTE — Assessment & Plan Note (Signed)
History of bilateral renal artery stenting by Dr. Rolm Baptise in 06/18/06 which we have been following by duplex ultrasound and they have remained widely patent.

## 2014-11-17 NOTE — Progress Notes (Signed)
11/17/2014 Edward [name]   15-Jan-1930  [id number]  Primary Physician Mayra Neer, MD Primary Cardiologist: Lorretta Harp MD Renae Gloss   HPI:  Edward [name] is a delightful 81 year old married Caucasian male father of 2 children, grandfather of 5 grandchildren who worked at [employer] heart in the past. I last saw him 6 months ago. His primary care physician is Dr. Serita Grammes. He was referred by Dr. Casandra Doffing for peripheral vascular evaluation but has expressed a desire to follow up with me for his cardiology care as well in the future. He has a history of treated hypertension and hyperlipidemia. He has never smoked. Her father did have A. Myocardial infarction in his 85s. He had an myocardial function in October of 2001 and had 2 NIR bare metal stents placed in his RCA by Dr. Quay Burow 08/20/00. He has had renal artery stenting bilaterally performed by Dr. Rolm Baptise 06/18/06. He had carotid Dopplers performed recently that showed moderate bilateral internal carotid artery stenosis with incidentally noted bilateral subclavian artery disease. His vertebral flow was antegrade bilaterally. He is totally asymptomatic.   Current Outpatient Prescriptions  Medication Sig Dispense Refill  . amLODipine (NORVASC) 10 MG tablet Take 0.5 tablets (5 mg total) by mouth daily. 45 tablet 3  . aspirin 325 MG tablet Take 325 mg by mouth daily.    . Calcium Carbonate-Vitamin D (CALCIUM PLUS VITAMIN D PO) Take by mouth.    . cloNIDine (CATAPRES) 0.1 MG tablet Take 1 tablet by mouth daily.     . Coenzyme Q10 (CO Q-10) 100 MG CAPS Take 100 mg by mouth daily.    . folic acid (FOLVITE) 354 MCG tablet Take 400 mcg by mouth daily.    . hydrocortisone (ANUSOL-HC) 2.5 % rectal cream Place 1 application rectally 2 (two) times daily.    Marland Kitchen LORazepam (ATIVAN) 1 MG tablet Take 1 mg by mouth 2 (two) times daily.     . metoprolol (LOPRESSOR) 50 MG tablet Take 1 tablet (50 mg total) by mouth 2 (two)  times daily. 180 tablet 4  . olmesartan (BENICAR) 40 MG tablet Take 40 mg by mouth daily.    . Omega-3 Fatty Acids (FISH OIL) 1000 MG CAPS Take 1,000 mg by mouth 2 (two) times daily.     Marland Kitchen omeprazole (PRILOSEC) 20 MG capsule Take 20 mg by mouth daily.    . rosuvastatin (CRESTOR) 20 MG tablet Take 1 tablet (20 mg total) by mouth daily. 90 tablet 3  . tamsulosin (FLOMAX) 0.4 MG CAPS capsule Take 1 capsule by mouth daily.    Marland Kitchen triamcinolone cream (KENALOG) 0.1 % as needed.     No current facility-administered medications for this visit.    Allergies  Allergen Reactions  . Erythromycin   . Penicillins   . Prednisone     History   Social History  . Marital Status: Married    Spouse Name: N/A    Number of Children: N/A  . Years of Education: N/A   Occupational History  . Not on file.   Social History Main Topics  . Smoking status: Never Smoker   . Smokeless tobacco: Never Used  . Alcohol Use: No  . Drug Use: No  . Sexual Activity: Not on file   Other Topics Concern  . Not on file   Social History Narrative     Review of Systems: General: negative for chills, fever, night sweats or weight changes.  Cardiovascular: negative for chest pain, dyspnea  on exertion, edema, orthopnea, palpitations, paroxysmal nocturnal dyspnea or shortness of breath Dermatological: negative for rash Respiratory: negative for cough or wheezing Urologic: negative for hematuria Abdominal: negative for nausea, vomiting, diarrhea, bright red blood per rectum, melena, or hematemesis Neurologic: negative for visual changes, syncope, or dizziness All other systems reviewed and are otherwise negative except as noted above.    Blood pressure 152/86, pulse 49, height 5' 10.25" (1.784 m), weight 169 lb (76.658 kg).  General appearance: alert and no distress Neck: no adenopathy, no JVD, supple, symmetrical, trachea midline, thyroid not enlarged, symmetric, no tenderness/mass/nodules and bilateral carotid  and subclavian bruits Lungs: clear to auscultation bilaterally Heart: regular rate and rhythm, S1, S2 normal, no murmur, click, rub or gallop Extremities: extremities normal, atraumatic, no cyanosis or edema  EKG sinus bradycardia at 49 with right bundle branch block. I personally reviewed this EKG  ASSESSMENT AND PLAN:   Renal artery stenosis History of bilateral renal artery stenting by Dr. Rolm Baptise in 06/18/06 which we have been following by duplex ultrasound and they have remained widely patent.   Mixed hyperlipidemia History of hyperlipidemia on rosuvastatin 20 mg a day with his last lipid profile performed 03/13/14 revealing a total cholesterol 110, LDL 51 and HDL 40   Essential hypertension, benign History of hypertension with blood pressure measured today at 152/66. He is on amlodipine, clonidine, and Benicar as well as metoprolol. Continue current meds at current dosing   Coronary atherosclerosis of native coronary artery History of CAD status post myocardial infarction October 2001 with 2 bare metal stents placed in his RCA by Dr. Quay Burow (08/20/00). He denies chest pain or shortness of breath.   Occlusion and stenosis of carotid artery without mention of cerebral infarction History of carotid artery disease with Dopplers performed recently suggesting moderate bilateral ICA stenosis bilateral subclavian disease as well. (08/31/14). He is neurologically asymptomatic.       Lorretta Harp MD FACP,FACC,FAHA, [facility] 11/17/2014 12:44 PM

## 2014-11-17 NOTE — Assessment & Plan Note (Signed)
History of hyperlipidemia on rosuvastatin 20 mg a day with his last lipid profile performed 03/13/14 revealing a total cholesterol 110, LDL 51 and HDL 40

## 2014-11-17 NOTE — Assessment & Plan Note (Signed)
History of CAD status post myocardial infarction October 2001 with 2 bare metal stents placed in his RCA by Dr. Quay Burow (08/20/00). He denies chest pain or shortness of breath.

## 2014-11-17 NOTE — Patient Instructions (Signed)
Your physician wants you to follow-up in: 6 months with Dr Berry. You will receive a reminder letter in the mail two months in advance. If you don't receive a letter, please call our office to schedule the follow-up appointment.  

## 2014-11-17 NOTE — Assessment & Plan Note (Signed)
History of carotid artery disease with Dopplers performed recently suggesting moderate bilateral ICA stenosis bilateral subclavian disease as well. (08/31/14). He is neurologically asymptomatic.

## 2014-11-17 NOTE — Assessment & Plan Note (Signed)
History of hypertension with blood pressure measured today at 152/66. He is on amlodipine, clonidine, and Benicar as well as metoprolol. Continue current meds at current dosing

## 2014-11-30 ENCOUNTER — Telehealth: Payer: Self-pay | Admitting: Cardiovascular Disease

## 2014-11-30 MED ORDER — OLMESARTAN MEDOXOMIL 40 MG PO TABS: 40.0000 mg | ORAL_TABLET | Freq: Every day | ORAL | Status: DC

## 2014-11-30 MED ORDER — ROSUVASTATIN CALCIUM 20 MG PO TABS: 20.0000 mg | ORAL_TABLET | Freq: Every day | ORAL | Status: DC

## 2014-11-30 NOTE — Telephone Encounter (Signed)
Pt would like some samples of Crestor and Benicar please.

## 2014-11-30 NOTE — Telephone Encounter (Signed)
Spoke with pt, Patient aware samples are at the front desk for pick up

## 2015-01-02 ENCOUNTER — Telehealth: Payer: Self-pay | Admitting: Cardiovascular Disease

## 2015-01-02 NOTE — Telephone Encounter (Signed)
Pt called in wanting some samples of Benicar 40 if some are available. Please call  THanks

## 2015-01-03 MED ORDER — OLMESARTAN MEDOXOMIL 40 MG PO TABS: 40.0000 mg | ORAL_TABLET | Freq: Every day | ORAL | Status: DC

## 2015-01-03 NOTE — Telephone Encounter (Signed)
Patient aware samples are at the front desk for pick up  

## 2015-02-05 ENCOUNTER — Encounter (HOSPITAL_COMMUNITY): Payer: Medicare Other

## 2015-02-08 ENCOUNTER — Telehealth: Payer: Self-pay | Admitting: Cardiovascular Disease

## 2015-02-08 MED ORDER — ROSUVASTATIN CALCIUM 20 MG PO TABS
20.0000 mg | ORAL_TABLET | Freq: Every day | ORAL | Status: DC
Start: 2015-02-08 — End: 2015-03-26

## 2015-02-08 NOTE — Telephone Encounter (Signed)
Informed patient we did not have Benicar 40 mg samples. Advised patient to try again in ~2 weeks for Benicar samples. Also informed patient that Crestor will be going generic in May and supplied enough Crestor to get patient to that timeframe.   Medication samples have been provided to the patient. Drug name: Crestor 20 mg Qty: 21 tabs LOT: [id number] Exp.Date: 03/2017 Samples left at front desk for patient pick-up. Patient notified.  Joie Reamer, [place] 9:03 AM 02/08/2015

## 2015-02-08 NOTE — Telephone Encounter (Signed)
Pt would like samples of Crestor and Benicar please.

## 2015-02-20 ENCOUNTER — Telehealth: Payer: Self-pay | Admitting: Cardiovascular Disease

## 2015-02-20 MED ORDER — OLMESARTAN MEDOXOMIL 40 MG PO TABS: 40.0000 mg | ORAL_TABLET | Freq: Every day | ORAL | Status: DC

## 2015-02-20 NOTE — Telephone Encounter (Signed)
Mr. Southwell was told tocall back this week to se if we have any samples of Benicar . Please call   Thanks

## 2015-02-20 NOTE — Telephone Encounter (Signed)
Left message on answer machine 2 weeks of samples available, can be pick up

## 2015-03-13 ENCOUNTER — Telehealth: Payer: Self-pay | Admitting: Cardiovascular Disease

## 2015-03-13 NOTE — Telephone Encounter (Signed)
He would like some samples of Benicar and Crestor please.

## 2015-03-13 NOTE — Telephone Encounter (Signed)
No answer at number provided No samples available today

## 2015-03-13 NOTE — Telephone Encounter (Signed)
Pt was notified that there were no samples.

## 2015-03-26 ENCOUNTER — Other Ambulatory Visit: Payer: Self-pay | Admitting: *Deleted

## 2015-03-26 ENCOUNTER — Telehealth: Payer: Self-pay | Admitting: Cardiovascular Disease

## 2015-03-26 MED ORDER — ROSUVASTATIN CALCIUM 20 MG PO TABS: 20.0000 mg | ORAL_TABLET | Freq: Every day | ORAL | Status: DC

## 2015-03-26 NOTE — Telephone Encounter (Signed)
Refill complete 

## 2015-03-26 NOTE — Telephone Encounter (Signed)
Pt wants to know where can he get the generic Crestor? Every pharmacy he have been calling is saying they do not have it yet.

## 2015-04-11 ENCOUNTER — Other Ambulatory Visit: Payer: Self-pay | Admitting: Interventional Cardiology

## 2015-04-11 NOTE — Telephone Encounter (Signed)
Rx(s) sent to pharmacy electronically.  

## 2015-08-23 ENCOUNTER — Other Ambulatory Visit: Payer: Self-pay | Admitting: Cardiovascular Disease

## 2015-08-23 DIAGNOSIS — I739 Peripheral vascular disease, unspecified: Secondary | ICD-10-CM

## 2015-08-23 DIAGNOSIS — I6523 Occlusion and stenosis of bilateral carotid arteries: Secondary | ICD-10-CM

## 2015-09-03 ENCOUNTER — Ambulatory Visit (HOSPITAL_COMMUNITY)
Admission: RE | Admit: 2015-09-03 | Discharge: 2015-09-03 | Disposition: A | Discharge status: Home / Self Care | Payer: Medicare Other | Source: Ambulatory Visit | Attending: Cardiovascular Disease | Admitting: Cardiovascular Disease

## 2015-09-03 DIAGNOSIS — I10 Essential (primary) hypertension: Secondary | ICD-10-CM | POA: Insufficient documentation

## 2015-09-03 DIAGNOSIS — E785 Hyperlipidemia, unspecified: Secondary | ICD-10-CM | POA: Diagnosis not present

## 2015-09-03 DIAGNOSIS — I6523 Occlusion and stenosis of bilateral carotid arteries: Secondary | ICD-10-CM | POA: Insufficient documentation

## 2015-09-20 ENCOUNTER — Telehealth: Payer: Self-pay | Admitting: Cardiovascular Disease

## 2015-09-20 NOTE — Telephone Encounter (Signed)
Mr.Saxer is calling because his blood pressure has been up . Running higher than normal. Please call   Thanks

## 2015-09-20 NOTE — Telephone Encounter (Signed)
Spoke with pt, he is concerned about his bp  His last carotid ultrasound showed a change in the blockage. His bp is running left= 129/58 am       Right= 94/52 am                                     146/72 pm                  148/66 pm                              Left= 136/68 am     Right = 104/55 am                                      164/78 pm                  130/62 pm.  He has a follow up appt in January with dr berry but is concerned about the difference in the bp left compared to right. Will forward for dr berry"s review

## 2015-09-21 NOTE — Telephone Encounter (Signed)
I'm not concerned that we can discuss at his next office visit

## 2015-09-21 NOTE — Telephone Encounter (Signed)
Pt aware this is not some that requires immediate action and can be discussed in more detail with in during 11/09/2015 appointment if pt still concerned.

## 2015-10-17 ENCOUNTER — Encounter: Payer: Self-pay | Admitting: Cardiovascular Disease

## 2015-10-17 ENCOUNTER — Ambulatory Visit (INDEPENDENT_AMBULATORY_CARE_PROVIDER_SITE_OTHER): Payer: Medicare Other | Admitting: Cardiovascular Disease

## 2015-10-17 VITALS — BP 152/62 | Ht 69.0 in | Wt 168.7 lb

## 2015-10-17 DIAGNOSIS — E782 Mixed hyperlipidemia: Secondary | ICD-10-CM

## 2015-10-17 DIAGNOSIS — I2510 Atherosclerotic heart disease of native coronary artery without angina pectoris: Secondary | ICD-10-CM

## 2015-10-17 DIAGNOSIS — I10 Essential (primary) hypertension: Secondary | ICD-10-CM | POA: Diagnosis not present

## 2015-10-17 DIAGNOSIS — I158 Other secondary hypertension: Secondary | ICD-10-CM | POA: Diagnosis not present

## 2015-10-17 NOTE — Progress Notes (Signed)
10/17/2015 Edward [name]   06-27-1930  [id number]  Primary Physician Mayra Neer, MD Primary Cardiologist: Lorretta Harp MD Renae Gloss   HPI:  [name] is a delightful 81 year old married Caucasian male father of 2 children, grandfather of 5 grandchildren who worked at [employer]  in the past. I last saw him 6 months ago. I last saw him in the office 11/17/14. His primary care physician is Dr. Serita Grammes. He was referred by Dr. Casandra Doffing for peripheral vascular evaluation but has expressed a desire to follow up with me for his cardiology care as well in the future. He has a history of treated hypertension and hyperlipidemia. He has never smoked. Her father did have A. Myocardial infarction in his 15s. He had an myocardial function in October of 2001 and had 2 NIR bare metal stents placed in his RCA by Dr. Quay Burow 08/20/00. He has had renal artery stenting bilaterally performed by Dr. Rolm Baptise 06/18/06. He had carotid Dopplers performed recently that showed moderate bilateral internal carotid artery stenosis with incidentally noted bilateral subclavian artery disease. His vertebral flow was antegrade bilaterally. He is totally asymptomatic. He does measure his blood pressure twice a day and has noticed evening hypertension. He also has noticed increasing dyspnea on exertion but denies chest pain.   Current Outpatient Prescriptions  Medication Sig Dispense Refill  . atorvastatin (LIPITOR) 40 MG tablet Take 40 mg by mouth daily.    Marland Kitchen losartan (COZAAR) 100 MG tablet Take 100 mg by mouth daily. Take 50 mg (half tablet) in the morning and 50 mg (halft tablet) in the evening    . amLODipine (NORVASC) 10 MG tablet TAKE 1/2 TABLET BY MOUTH DAILY 45 tablet 6  . aspirin 325 MG tablet Take 325 mg by mouth daily.    . Calcium Carbonate-Vitamin D (CALCIUM PLUS VITAMIN D PO) Take by mouth.    . cloNIDine (CATAPRES) 0.1 MG tablet Take 1 tablet by mouth daily.     .  Coenzyme Q10 (CO Q-10) 100 MG CAPS Take 100 mg by mouth daily.    . folic acid (FOLVITE) A999333 MCG tablet Take 400 mcg by mouth daily.    . hydrocortisone (ANUSOL-HC) 2.5 % rectal cream Place 1 application rectally 2 (two) times daily.    Marland Kitchen LORazepam (ATIVAN) 1 MG tablet Take 1 mg by mouth 2 (two) times daily.     . metoprolol (LOPRESSOR) 50 MG tablet Take 1 tablet (50 mg total) by mouth 2 (two) times daily. 180 tablet 4  . olmesartan (BENICAR) 40 MG tablet Take 1 tablet (40 mg total) by mouth daily. (Patient not taking: Reported on 10/17/2015) 14 tablet 0  . Omega-3 Fatty Acids (FISH OIL) 1000 MG CAPS Take 1,000 mg by mouth 2 (two) times daily.     Marland Kitchen omeprazole (PRILOSEC) 20 MG capsule Take 20 mg by mouth daily.    . rosuvastatin (CRESTOR) 20 MG tablet Take 1 tablet (20 mg total) by mouth daily. (Patient not taking: Reported on 10/17/2015) 30 tablet 6  . tamsulosin (FLOMAX) 0.4 MG CAPS capsule Take 1 capsule by mouth daily.    Marland Kitchen triamcinolone cream (KENALOG) 0.1 % as needed.     No current facility-administered medications for this visit.    Allergies  Allergen Reactions  . Erythromycin   . Penicillins   . Prednisone     Social History   Social History  . Marital Status: Married    Spouse Name: N/A  . Number  of Children: N/A  . Years of Education: N/A   Occupational History  . Not on file.   Social History Main Topics  . Smoking status: Never Smoker   . Smokeless tobacco: Never Used  . Alcohol Use: No  . Drug Use: No  . Sexual Activity: Not on file   Other Topics Concern  . Not on file   Social History Narrative     Review of Systems: General: negative for chills, fever, night sweats or weight changes.  Cardiovascular: negative for chest pain, dyspnea on exertion, edema, orthopnea, palpitations, paroxysmal nocturnal dyspnea or shortness of breath Dermatological: negative for rash Respiratory: negative for cough or wheezing Urologic: negative for hematuria Abdominal:  negative for nausea, vomiting, diarrhea, bright red blood per rectum, melena, or hematemesis Neurologic: negative for visual changes, syncope, or dizziness All other systems reviewed and are otherwise negative except as noted above.    Blood pressure 152/62, height 5\' 9"  (1.753 m), weight 168 lb 11.2 oz (76.522 kg).  General appearance: alert and no distress Neck: no adenopathy, no JVD, supple, symmetrical, trachea midline, thyroid not enlarged, symmetric, no tenderness/mass/nodules and soft bilateral carotid bruits Lungs: clear to auscultation bilaterally Heart: regular rate and rhythm, S1, S2 normal, no murmur, click, rub or gallop Extremities: extremities normal, atraumatic, no cyanosis or edema  EKG sinus bradycardia at 50 with right bundle branch block. I personally reviewed this EKG  ASSESSMENT AND PLAN:   Renal artery stenosis History of renal artery stenosis status post bilateral renal artery stenting by Dr. Ardeen Garland 06/18/06. His last renal Doppler performed 03/17/14 were okay. His blood pressures have increased over the last 12 months. We will recheck renal Doppler studies.  Occlusion and stenosis of carotid artery without mention of cerebral infarction History of carotid artery disease with known moderate bilateral internal carotid artery stenosis by duplex ultrasound performed 09/03/15. This will be repeated on an annual basis. He is neurologically asymptomatic.  Mixed hyperlipidemia History of hyper-lipidemia on atorvastatin followed by his PCP  Essential hypertension, benign History of hypertension blood pressure measures 152/62. He is on losartan, clonidine, metoprolol and amlodipine. He does check his blood pressures at home twice a day. Blood pressures are acceptable and he has evening hypertension. I'm going to split his losartan in half and have him take half the morning and half in the evening. Will check renal Doppler studies. He'll see Erasmo Downer back in one month for further  evaluation and medication recommendations.  Coronary atherosclerosis of native coronary artery History of coronary artery disease status post coronary intervention by Dr. Ilda Foil 08/20/00 with 2 NIR bare metal stents placed in his RCA. He has noticed increasing dyspnea on exertion over the last year. I'm going to obtain an exercise Myoview stress test rule out an ischemic etiology.      Lorretta Harp MD FACP,FACC,FAHA, [facility] 10/17/2015 11:42 AM

## 2015-10-17 NOTE — Assessment & Plan Note (Signed)
History of renal artery stenosis status post bilateral renal artery stenting by Dr. Ardeen Garland 06/18/06. His last renal Doppler performed 03/17/14 were okay. His blood pressures have increased over the last 12 months. We will recheck renal Doppler studies.

## 2015-10-17 NOTE — Assessment & Plan Note (Addendum)
History of hypertension blood pressure measures 152/62. He is on losartan, clonidine, metoprolol and amlodipine. He does check his blood pressures at home twice a day. Blood pressures are acceptable and he has evening hypertension. I'm going to split his losartan in half and have him take half the morning and half in the evening. Will check renal Doppler studies. He'll see Erasmo Downer back in one month for further evaluation and medication recommendations.

## 2015-10-17 NOTE — Assessment & Plan Note (Signed)
History of hyperlipidemia on atorvastatin followed by his PCP 

## 2015-10-17 NOTE — Assessment & Plan Note (Signed)
History of carotid artery disease with known moderate bilateral internal carotid artery stenosis by duplex ultrasound performed 09/03/15. This will be repeated on an annual basis. He is neurologically asymptomatic.

## 2015-10-17 NOTE — Assessment & Plan Note (Signed)
History of coronary artery disease status post coronary intervention by Dr. Ilda Foil 08/20/00 with 2 NIR bare metal stents placed in his RCA. He has noticed increasing dyspnea on exertion over the last year. I'm going to obtain an exercise Myoview stress test rule out an ischemic etiology.

## 2015-10-17 NOTE — Patient Instructions (Addendum)
Medication Instructions:  Your physician has recommended you make the following change in your medication:  1) CHANGE Losartan to 50 mg (half tablet) by mouth in the morning; 50 mg (half tablet) by mouth in the evening    Labwork: none  Testing/Procedures: Your physician has requested that you have a renal artery duplex. During this test, an ultrasound is used to evaluate blood flow to the kidneys. Allow one hour for this exam. Do not eat after midnight the day before and avoid carbonated beverages. Take your medications as you usually do.  Your physician has requested that you have en exercise stress myoview. For further information please visit [email or web address]. Please follow instruction sheet, as given.   Follow-Up: Your physician recommends that you schedule a follow-up appointment in: 1 month with [place] physician wants you to follow-up in: 12 months with Dr. Gwenlyn Found.  You will receive a reminder letter in the mail two months in advance. If you don't receive a letter, please call our office to schedule the follow-up appointment.   Any Other Special Instructions Will Be Listed Below (If Applicable).  Dr. Gwenlyn Found would like you to check your blood pressure DAILY for the next 4 weeks.  Keep a journal of these daily BP and heart rate reading and call our office with the results.     If you need a refill on your cardiac medications before your next appointment, please call your pharmacy.

## 2015-11-01 ENCOUNTER — Telehealth (HOSPITAL_COMMUNITY): Payer: Self-pay

## 2015-11-01 NOTE — Telephone Encounter (Signed)
Encounter complete. 

## 2015-11-06 ENCOUNTER — Ambulatory Visit (HOSPITAL_COMMUNITY)
Admission: RE | Admit: 2015-11-06 | Discharge: 2015-11-06 | Disposition: A | Discharge status: Home / Self Care | Payer: Medicare Other | Source: Ambulatory Visit | Attending: Cardiovascular Disease | Admitting: Cardiovascular Disease

## 2015-11-06 ENCOUNTER — Ambulatory Visit (HOSPITAL_BASED_OUTPATIENT_CLINIC_OR_DEPARTMENT_OTHER)
Admission: RE | Admit: 2015-11-06 | Discharge: 2015-11-06 | Disposition: A | Discharge status: Home / Self Care | Payer: Medicare Other | Source: Ambulatory Visit | Attending: Cardiovascular Disease | Admitting: Cardiovascular Disease

## 2015-11-06 DIAGNOSIS — E785 Hyperlipidemia, unspecified: Secondary | ICD-10-CM | POA: Diagnosis not present

## 2015-11-06 DIAGNOSIS — I2510 Atherosclerotic heart disease of native coronary artery without angina pectoris: Secondary | ICD-10-CM | POA: Diagnosis not present

## 2015-11-06 DIAGNOSIS — I4510 Unspecified right bundle-branch block: Secondary | ICD-10-CM | POA: Insufficient documentation

## 2015-11-06 DIAGNOSIS — R0609 Other forms of dyspnea: Secondary | ICD-10-CM | POA: Diagnosis not present

## 2015-11-06 DIAGNOSIS — Z8249 Family history of ischemic heart disease and other diseases of the circulatory system: Secondary | ICD-10-CM | POA: Insufficient documentation

## 2015-11-06 DIAGNOSIS — I701 Atherosclerosis of renal artery: Secondary | ICD-10-CM | POA: Diagnosis not present

## 2015-11-06 DIAGNOSIS — I158 Other secondary hypertension: Secondary | ICD-10-CM

## 2015-11-06 DIAGNOSIS — I779 Disorder of arteries and arterioles, unspecified: Secondary | ICD-10-CM | POA: Insufficient documentation

## 2015-11-06 DIAGNOSIS — I774 Celiac artery compression syndrome: Secondary | ICD-10-CM | POA: Diagnosis not present

## 2015-11-06 DIAGNOSIS — I708 Atherosclerosis of other arteries: Secondary | ICD-10-CM | POA: Diagnosis not present

## 2015-11-06 DIAGNOSIS — I10 Essential (primary) hypertension: Secondary | ICD-10-CM | POA: Insufficient documentation

## 2015-11-06 LAB — MYOCARDIAL PERFUSION IMAGING
CHL RATE OF PERCEIVED EXERTION: 16
CSEPEW: 5.8 METS
CSEPHR: 110 %
CSEPPHR: 151 {beats}/min
Exercise duration (min): 4 min
LVDIAVOL: 78 mL
LVSYSVOL: 28 mL
MPHR: 137 {beats}/min
NUC STRESS TID: 1.13
Rest HR: 88 {beats}/min
SDS: 1
SRS: 2
SSS: 3

## 2015-11-06 MED ORDER — TECHNETIUM TC 99M SESTAMIBI GENERIC - CARDIOLITE
31.2000 | Freq: Once | INTRAVENOUS | Status: AC | PRN
  Administered 2015-11-06: 31.2 via INTRAVENOUS

## 2015-11-06 MED ORDER — TECHNETIUM TC 99M SESTAMIBI GENERIC - CARDIOLITE
10.6000 | Freq: Once | INTRAVENOUS | Status: AC | PRN
  Administered 2015-11-06: 11 via INTRAVENOUS

## 2015-11-09 ENCOUNTER — Ambulatory Visit: Payer: Medicare Other | Admitting: Cardiovascular Disease

## 2015-11-09 ENCOUNTER — Telehealth: Payer: Self-pay

## 2015-11-09 DIAGNOSIS — I701 Atherosclerosis of renal artery: Secondary | ICD-10-CM

## 2015-11-09 NOTE — Telephone Encounter (Signed)
-----   Message from Lorretta Harp, MD sent at 11/06/2015  3:43 PM EST ----- No change from prior study. Repeat in 12 months.

## 2015-11-09 NOTE — Telephone Encounter (Signed)
This encounter was created in error - please disregard.

## 2015-11-14 ENCOUNTER — Other Ambulatory Visit: Payer: Self-pay | Admitting: Interventional Cardiology

## 2015-11-15 ENCOUNTER — Ambulatory Visit: Payer: Medicare Other | Admitting: Pharmacist Clinician (PhC)/ Clinical Pharmacy Specialist

## 2015-11-15 NOTE — Telephone Encounter (Signed)
Rx(s) sent to pharmacy electronically.  

## 2015-11-20 ENCOUNTER — Telehealth: Payer: Self-pay | Admitting: Cardiovascular Disease

## 2015-11-20 NOTE — Telephone Encounter (Signed)
BP readings:  01/07 118/59  57    145/67   53 01/08 117/58  61    139/70   51 01/09 104/82  59    151/71   50 01/10 127/64  57     138/64  50 01/11 111/49  60    154/72   50 0/12  163/77  56     161/68   57 1/13  125/60  76     164/77   51 1/14  111/61  65     151/68   54 1/15  98/51    69     130/63   54 1/16 100/51   61      141/64  50 1/17  122/65  59  01/15  Left out Benicar that morning  01/16  Left out Benicar that morning

## 2015-11-20 NOTE — Telephone Encounter (Signed)
Edward [name] is calling with his  bp readings . Please call   Thanks

## 2015-11-20 NOTE — Telephone Encounter (Signed)
Please have Edward [name] address

## 2015-11-20 NOTE — Telephone Encounter (Signed)
Sent to [facility] per Dr. Kennon Holter request

## 2015-11-21 ENCOUNTER — Other Ambulatory Visit: Payer: Self-pay | Admitting: Interventional Cardiology

## 2015-11-22 NOTE — Telephone Encounter (Signed)
Rx request sent to pharmacy.  

## 2015-11-27 ENCOUNTER — Encounter: Payer: Self-pay | Admitting: Pharmacist Clinician (PhC)/ Clinical Pharmacy Specialist

## 2015-11-27 ENCOUNTER — Ambulatory Visit (INDEPENDENT_AMBULATORY_CARE_PROVIDER_SITE_OTHER): Payer: Medicare Other | Admitting: Pharmacist Clinician (PhC)/ Clinical Pharmacy Specialist

## 2015-11-27 VITALS — BP 160/72 | HR 68 | Ht 69.0 in | Wt 170.7 lb

## 2015-11-27 DIAGNOSIS — I10 Essential (primary) hypertension: Secondary | ICD-10-CM

## 2015-11-27 NOTE — Progress Notes (Signed)
11/27/2015 Edward [name] 06/09/1930 [id number]   HPI:  Edward [name] is a 82 y.o. male patient of Edward [name], with a PMH below who presents today for hypertension clinic evaluation.  He had bilateral renal artery stenosis, with stenting in 1007.  Most recent dopplers show no blockages.  He has had hypertension for some time, and has noted a pattern of normal morning readings with elevated evening pressures.  His morning readings have dropped < 123XX123 systolic on a few occasions, and he notes feeling lethargy and faintness at those times.  He has 2 different BP cuffs, both Omron.  One is quite old and he bought a second because of concerns the first wasn't accurate.  Both cuffs give similar readings at home.    Cardiac Hx: RAS with stents, carotid stenosis, hyperlipidemia, PCI with 2 stents (2001)  Family Hx: no significant family history  Social Hx: no tobacco (despite working at [employer] for much of his career); no alcohol, no caffeine  Diet: eats lunch out most days, prefers [employer], tries to order fresh vegetables whenever possible; dose not add salt,   Exercise: unable to walk much because of foot problems, but does 25 sit ups and 25 push ups each morning  Home BP readings: pattern for past several months show 90-130's in mornings, 140-160s in evenings.  Diastolic readings all WNL  Current antihypertensive medications: losartan 50 mg bid, metoprolol 50 mg bid, amlodipine 5 mg at lunch, clonidine 0.1 mg at dinner   Wt Readings from Last 3 Encounters:  11/27/15 170 lb 11.2 oz (77.429 kg)  11/06/15 168 lb (76.204 kg)  10/17/15 168 lb 11.2 oz (76.522 kg)   BP Readings from Last 3 Encounters:  11/27/15 160/72  10/17/15 152/62  11/17/14 152/86   Pulse Readings from Last 3 Encounters:  11/27/15 68  11/17/14 49  03/10/14 48    Current Outpatient Prescriptions  Medication Sig Dispense Refill  . amLODipine (NORVASC) 10 MG tablet TAKE 1/2 TABLET BY MOUTH DAILY 45 tablet 6    . aspirin 325 MG tablet Take 325 mg by mouth daily.    Marland Kitchen atorvastatin (LIPITOR) 40 MG tablet Take 40 mg by mouth daily.    . Calcium Carbonate-Vitamin D (CALCIUM PLUS VITAMIN D PO) Take by mouth.    . cloNIDine (CATAPRES) 0.1 MG tablet Take 1 tablet by mouth daily.     . Coenzyme Q10 (CO Q-10) 100 MG CAPS Take 100 mg by mouth daily.    . folic acid (FOLVITE) A999333 MCG tablet Take 400 mcg by mouth daily.    . hydrocortisone (ANUSOL-HC) 2.5 % rectal cream Place 1 application rectally 2 (two) times daily.    Marland Kitchen LORazepam (ATIVAN) 1 MG tablet Take 1 mg by mouth 2 (two) times daily.     Marland Kitchen losartan (COZAAR) 100 MG tablet Take 100 mg by mouth daily. Take 50 mg (half tablet) in the morning and 50 mg (halft tablet) in the evening    . metoprolol (LOPRESSOR) 50 MG tablet TAKE 1 TABLET (50 MG TOTAL) BY MOUTH 2 (TWO) TIMES DAILY. 180 tablet 3  . olmesartan (BENICAR) 40 MG tablet Take 1 tablet (40 mg total) by mouth daily. (Patient not taking: Reported on 10/17/2015) 14 tablet 0  . Omega-3 Fatty Acids (FISH OIL) 1000 MG CAPS Take 1,000 mg by mouth 2 (two) times daily.     Marland Kitchen omeprazole (PRILOSEC) 20 MG capsule Take 20 mg by mouth daily.    . rosuvastatin (  CRESTOR) 20 MG tablet Take 1 tablet (20 mg total) by mouth daily. (Patient not taking: Reported on 10/17/2015) 30 tablet 6  . tamsulosin (FLOMAX) 0.4 MG CAPS capsule Take 1 capsule by mouth daily.    Marland Kitchen triamcinolone cream (KENALOG) 0.1 % as needed.     No current facility-administered medications for this visit.    Allergies  Allergen Reactions  . Erythromycin   . Penicillins   . Prednisone     Past Medical History  Diagnosis Date  . Atherosclerosis of abdominal aorta (HCC)     CT/abd and pelvis  . Hyperlipidemia   . Coronary artery disease     s/p BM stent, prox and mid RCA, 2001, cutting ballon RCA stenosis, 2002  . Renovascular hypertension     s./p. bilateral RA stent implant  . Carotid artery occlusion     bilateral carotid bruit, right  greater then left -bilateral 40-50% stenosis, 12/26/09- no change  . Left kidney mass     lower pole, observing by urology- Edward. Reece Agar  . Chronic anxiety   . History of renal angiogram     9/10, showed patent renal stents, 30-40% instent restenosis ws the most severe lesion  . History of echocardiogram     2/11 echo EF 70%, mild MR, mildly elevated pulmonary pressures 42 mmHg  . Peripheral neuropathy (HCC)     in both feet from nerve compression   . Dysphagia     from esophageal dysmotility-tx with careful eating.   . Subclavian artery stenosis (HCC)     Blood pressure 160/72, pulse 68, height 5\' 9"  (1.753 m), weight 170 lb 11.2 oz (77.429 kg).    [name] PharmD CPP [facility]

## 2015-11-27 NOTE — Assessment & Plan Note (Addendum)
BP elevated in the office today at 160/72.  Because of his problems with low blood pressure in the mornings about once weekly, I am going to move his clonidine from evenings to lunch time.  I suspect that clonidine mixed with natural nighttime dipping may be the cause of his morning lows.   I have asked him to continue with twice daily home BP monitoring and I will see him again in one month.

## 2015-11-27 NOTE — Patient Instructions (Signed)
Return for a a follow up appointment in 1 month  Your blood pressure today is 160/72  (goal is < 150/90)  Check your blood pressure at home daily and keep record of the readings.  Take your BP meds as follows: (move clonidine to lunch time)  AM: losartan, metoprolol  Lunch: amlodipine, clonidine  PM: losartan, metoprolol  Bring all of your meds, your BP cuff and your record of home blood pressures to your next appointment.  Exercise as you're able, try to walk approximately 30 minutes per day.  Keep salt intake to a minimum, especially watch canned and prepared boxed foods.  Eat more fresh fruits and vegetables and fewer canned items.  Avoid eating in fast food restaurants.    HOW TO TAKE YOUR BLOOD PRESSURE: . Rest 5 minutes before taking your blood pressure. .  Don't smoke or drink caffeinated beverages for at least 30 minutes before. . Take your blood pressure before (not after) you eat. . Sit comfortably with your back supported and both feet on the floor (don't cross your legs). . Elevate your arm to heart level on a table or a desk. . Use the proper sized cuff. It should fit smoothly and snugly around your bare upper arm. There should be enough room to slip a fingertip under the cuff. The bottom edge of the cuff should be 1 inch above the crease of the elbow. . Ideally, take 3 measurements at one sitting and record the average.

## 2015-12-07 NOTE — Addendum Note (Signed)
Addended by: Diana Eves on: 12/07/2015 02:08 PM   Modules accepted: Orders

## 2015-12-25 ENCOUNTER — Encounter: Payer: Self-pay | Admitting: Pharmacist Clinician (PhC)/ Clinical Pharmacy Specialist

## 2015-12-25 ENCOUNTER — Ambulatory Visit (INDEPENDENT_AMBULATORY_CARE_PROVIDER_SITE_OTHER): Payer: Medicare Other | Admitting: Pharmacist Clinician (PhC)/ Clinical Pharmacy Specialist

## 2015-12-25 VITALS — BP 176/74 | HR 52 | Ht 69.0 in | Wt 169.9 lb

## 2015-12-25 DIAGNOSIS — I10 Essential (primary) hypertension: Secondary | ICD-10-CM

## 2015-12-25 MED ORDER — VALSARTAN 320 MG PO TABS: 320.0000 mg | ORAL_TABLET | Freq: Every day | ORAL | Status: DC

## 2015-12-25 NOTE — Assessment & Plan Note (Signed)
While his home readings have leveled out some, he is still not completely to goal.  We reviewed several options today, including adding on a diuretic such as chlorthalidone.  He previously took Scientist, water quality, having received samples for many months from his primary MD.  Although it recently went generic, it is still cost prohibitive on most insurance plans.   Therefore I am going to switch his losartan to valsartan 320 mg once daily in the mornings.  He will continue monitoring home blood pressures and return in 6 weeks for follow up.

## 2015-12-25 NOTE — Patient Instructions (Signed)
Return for a a follow up appointment in 6 weeks  Your blood pressure today is 176/74  Check your blood pressure at home daily and keep record of the readings.  Take your BP meds as follows: switch losartan to valsartn 320 mg once daily.  Continue with all other medications  Bring all of your meds, your BP cuff and your record of home blood pressures to your next appointment.  Exercise as you're able, try to walk approximately 30 minutes per day.  Keep salt intake to a minimum, especially watch canned and prepared boxed foods.  Eat more fresh fruits and vegetables and fewer canned items.  Avoid eating in fast food restaurants.    HOW TO TAKE YOUR BLOOD PRESSURE: . Rest 5 minutes before taking your blood pressure. .  Don't smoke or drink caffeinated beverages for at least 30 minutes before. . Take your blood pressure before (not after) you eat. . Sit comfortably with your back supported and both feet on the floor (don't cross your legs). . Elevate your arm to heart level on a table or a desk. . Use the proper sized cuff. It should fit smoothly and snugly around your bare upper arm. There should be enough room to slip a fingertip under the cuff. The bottom edge of the cuff should be 1 inch above the crease of the elbow. . Ideally, take 3 measurements at one sitting and record the average.

## 2015-12-25 NOTE — Progress Notes (Signed)
12/25/2015 LIBORIO CRAYCRAFT 1930/07/24 [id number]   HPI:  Edward [name] is a 82 y.o. male patient of Dr Gwenlyn Found, with a PMH below who presents today for hypertension clinic follow up.  He had bilateral renal artery stenosis, with stenting in 2007.  Most recent dopplers show no blockages.  He has had hypertension for some time, and has continued to note a pattern of normal morning readings with elevated evening pressures.  When I last saw him we switched his clonidine 0.1 mg once daily from evening to lunch.  Since then his morning BP readings have leveled out, mostly 120-140, now with no readings < 100.  His evening readings remain elevated, mostly [id number] systolic.  Today he brings in his home BP cuff, a newer Omron purchased just in the past few months.       Cardiac Hx: RAS with stents, carotid stenosis, hyperlipidemia, PCI with 2 stents (2001)  Family Hx: no significant family history  Social Hx: no tobacco (despite working at [employer] for much of his career); no alcohol, no caffeine  Diet: eats lunch out most days, prefers [employer], tries to order fresh vegetables whenever possible; dose not add salt,   Exercise: unable to walk much because of foot problems, but does 25 sit ups and 25 push ups each morning  Home BP readings: pattern for past several months show 120-130's in mornings, 140-160s in evenings.  Diastolic readings all WNL  Current antihypertensive medications: losartan 50 mg bid, metoprolol 50 mg bid, amlodipine 5 mg at lunch, clonidine 0.1 mg at lunch   Wt Readings from Last 3 Encounters:  12/25/15 169 lb 14.4 oz (77.066 kg)  11/27/15 170 lb 11.2 oz (77.429 kg)  11/06/15 168 lb (76.204 kg)   BP Readings from Last 3 Encounters:  12/25/15 176/74  11/27/15 160/72  10/17/15 152/62   Pulse Readings from Last 3 Encounters:  12/25/15 52  11/27/15 68  11/17/14 49    Current Outpatient Prescriptions  Medication Sig Dispense Refill  . amLODipine (NORVASC) 10  MG tablet TAKE 1/2 TABLET BY MOUTH DAILY 45 tablet 6  . aspirin 325 MG tablet Take 325 mg by mouth daily.    Marland Kitchen atorvastatin (LIPITOR) 40 MG tablet Take 40 mg by mouth daily.    . Calcium Carbonate-Vitamin D (CALCIUM PLUS VITAMIN D PO) Take by mouth.    . cloNIDine (CATAPRES) 0.1 MG tablet Take 1 tablet by mouth daily.     . Coenzyme Q10 (CO Q-10) 100 MG CAPS Take 100 mg by mouth daily.    . folic acid (FOLVITE) A999333 MCG tablet Take 400 mcg by mouth daily.    . hydrocortisone (ANUSOL-HC) 2.5 % rectal cream Place 1 application rectally 2 (two) times daily.    Marland Kitchen LORazepam (ATIVAN) 1 MG tablet Take 1 mg by mouth 2 (two) times daily.     Marland Kitchen losartan (COZAAR) 100 MG tablet Take 100 mg by mouth daily. Take 50 mg (half tablet) in the morning and 50 mg (halft tablet) in the evening    . metoprolol (LOPRESSOR) 50 MG tablet TAKE 1 TABLET (50 MG TOTAL) BY MOUTH 2 (TWO) TIMES DAILY. 180 tablet 3  . olmesartan (BENICAR) 40 MG tablet Take 1 tablet (40 mg total) by mouth daily. (Patient not taking: Reported on 10/17/2015) 14 tablet 0  . Omega-3 Fatty Acids (FISH OIL) 1000 MG CAPS Take 1,000 mg by mouth 2 (two) times daily.     Marland Kitchen omeprazole (PRILOSEC) 20 MG  capsule Take 20 mg by mouth daily.    . rosuvastatin (CRESTOR) 20 MG tablet Take 1 tablet (20 mg total) by mouth daily. (Patient not taking: Reported on 10/17/2015) 30 tablet 6  . tamsulosin (FLOMAX) 0.4 MG CAPS capsule Take 1 capsule by mouth daily.    Marland Kitchen triamcinolone cream (KENALOG) 0.1 % as needed.    . valsartan (DIOVAN) 320 MG tablet Take 1 tablet (320 mg total) by mouth daily. 30 tablet 5   No current facility-administered medications for this visit.    Allergies  Allergen Reactions  . Erythromycin   . Penicillins   . Prednisone     Past Medical History  Diagnosis Date  . Atherosclerosis of abdominal aorta (HCC)     CT/abd and pelvis  . Hyperlipidemia   . Coronary artery disease     s/p BM stent, prox and mid RCA, 2001, cutting ballon RCA  stenosis, 2002  . Renovascular hypertension     s./p. bilateral RA stent implant  . Carotid artery occlusion     bilateral carotid bruit, right greater then left -bilateral 40-50% stenosis, 12/26/09- no change  . Left kidney mass     lower pole, observing by urology- Dr. Reece Agar  . Chronic anxiety   . History of renal angiogram     9/10, showed patent renal stents, 30-40% instent restenosis ws the most severe lesion  . History of echocardiogram     2/11 echo EF 70%, mild MR, mildly elevated pulmonary pressures 42 mmHg  . Peripheral neuropathy (HCC)     in both feet from nerve compression   . Dysphagia     from esophageal dysmotility-tx with careful eating.   . Subclavian artery stenosis (HCC)     Blood pressure 176/74, pulse 52, height 5\' 9"  (1.753 m), weight 169 lb 14.4 oz (77.066 kg).    [name] PharmD CPP [facility]

## 2016-01-31 ENCOUNTER — Encounter: Payer: Self-pay | Admitting: Pharmacist Clinician (PhC)/ Clinical Pharmacy Specialist

## 2016-01-31 ENCOUNTER — Ambulatory Visit (INDEPENDENT_AMBULATORY_CARE_PROVIDER_SITE_OTHER): Payer: Medicare Other | Admitting: Pharmacist Clinician (PhC)/ Clinical Pharmacy Specialist

## 2016-01-31 VITALS — BP 162/78 | HR 52 | Ht 69.0 in | Wt 166.4 lb

## 2016-01-31 DIAGNOSIS — I10 Essential (primary) hypertension: Secondary | ICD-10-CM

## 2016-01-31 MED ORDER — AMLODIPINE BESYLATE 10 MG PO TABS: 10.0000 mg | ORAL_TABLET | Freq: Every day | ORAL | Status: DC

## 2016-01-31 NOTE — Patient Instructions (Signed)
Return for a a follow up appointment in 6-8 weeks  Your blood pressure today is 162/78  (goal is < 150/90)   Check your blood pressure at home daily and keep record of the readings.  Take your BP meds as follows: increase amlodipine 10 mg daily at noon.  If after 2 weeks your night BP reading is still elevated to > 150/90 most days, switch to taking amlodipine at 5 pm.  You can also switch the time of clonidine from noon to evening to see if that helps as well  Bring all of your meds, your BP cuff and your record of home blood pressures to your next appointment.  Exercise as you're able, try to walk approximately 30 minutes per day.  Keep salt intake to a minimum, especially watch canned and prepared boxed foods.  Eat more fresh fruits and vegetables and fewer canned items.  Avoid eating in fast food restaurants.    HOW TO TAKE YOUR BLOOD PRESSURE: . Rest 5 minutes before taking your blood pressure. .  Don't smoke or drink caffeinated beverages for at least 30 minutes before. . Take your blood pressure before (not after) you eat. . Sit comfortably with your back supported and both feet on the floor (don't cross your legs). . Elevate your arm to heart level on a table or a desk. . Use the proper sized cuff. It should fit smoothly and snugly around your bare upper arm. There should be enough room to slip a fingertip under the cuff. The bottom edge of the cuff should be 1 inch above the crease of the elbow. . Ideally, take 3 measurements at one sitting and record the average.

## 2016-01-31 NOTE — Assessment & Plan Note (Signed)
Although he has elevated pressure in the office this am, I suspect that is more related to white coat and not a true representation of his morning blood pressure.  To get his evening numbers down, I am going to have him increase the amlodipine to 10 mg daily.  If after 2 weeks he notices no improvement in evening numbers, he can switch it from noon to 5 pm to see if that would make a difference.  I also suggested that he can move the clonidine dose between noon and evening after about 2-3 weeks to see if that would have any impact.  I will see him again in 6 weeks for follow up.

## 2016-01-31 NOTE — Progress Notes (Signed)
.     01/31/2016 Edward [name] 1929/12/30 [id number]   HPI:  Edward [name] is a 82 y.o. male patient of Dr Gwenlyn Found, with a PMH below who presents today for hypertension clinic follow up.  He had bilateral renal artery stenosis, with stenting in 2007.  Most recent dopplers show no blockages.  He has had hypertension for some time, and has continues to note a pattern of normal morning readings with elevated evening pressures.  We have adjusted medication time of day with little success.  At his last visit I switched the losartan bid to valsartan 320 mg qd.  See list below for medications and time of day.  Today he comes in with continued steady am readings (only 235  >150), however evenings are 19/35 > 150.  Today he brings in his home BP cuff, a newer Omron purchased just in the past few months.       Cardiac Hx: RAS with stents, carotid stenosis, hyperlipidemia, PCI with 2 stents (2001)  Family Hx: no significant family history  Social Hx: no tobacco (despite working at [employer] for much of his career); no alcohol, no caffeine  Diet: eats lunch out most days, prefers [employer], tries to order fresh vegetables whenever possible; dose not add salt,   Exercise: unable to walk much because of foot problems, but does 25 sit ups and 25 push ups each morning  Home BP readings: pattern for past several months show 120-130's in mornings, 140-160s in evenings.  Diastolic readings all WNL  Current antihypertensive medications: valsartan 320 mg qam, metoprolol 50 mg bid, amlodipine 5 mg at lunch, clonidine 0.1 mg at lunch   Wt Readings from Last 3 Encounters:  01/31/16 166 lb 6.4 oz (75.479 kg)  12/25/15 169 lb 14.4 oz (77.066 kg)  11/27/15 170 lb 11.2 oz (77.429 kg)   BP Readings from Last 3 Encounters:  01/31/16 162/78  12/25/15 176/74  11/27/15 160/72   Pulse Readings from Last 3 Encounters:  01/31/16 52  12/25/15 52  11/27/15 68    Current Outpatient Prescriptions  Medication  Sig Dispense Refill  . amLODipine (NORVASC) 10 MG tablet Take 1 tablet (10 mg total) by mouth daily. 90 tablet 1  . aspirin 325 MG tablet Take 325 mg by mouth daily.    Marland Kitchen atorvastatin (LIPITOR) 40 MG tablet Take 40 mg by mouth daily.    . Calcium Carbonate-Vitamin D (CALCIUM PLUS VITAMIN D PO) Take by mouth.    . cloNIDine (CATAPRES) 0.1 MG tablet Take 1 tablet by mouth daily.     . Coenzyme Q10 (CO Q-10) 100 MG CAPS Take 100 mg by mouth daily.    . folic acid (FOLVITE) A999333 MCG tablet Take 400 mcg by mouth daily.    . hydrocortisone (ANUSOL-HC) 2.5 % rectal cream Place 1 application rectally 2 (two) times daily.    Marland Kitchen LORazepam (ATIVAN) 1 MG tablet Take 1 mg by mouth 2 (two) times daily.     . metoprolol (LOPRESSOR) 50 MG tablet TAKE 1 TABLET (50 MG TOTAL) BY MOUTH 2 (TWO) TIMES DAILY. 180 tablet 3  . Omega-3 Fatty Acids (FISH OIL) 1000 MG CAPS Take 1,000 mg by mouth 2 (two) times daily.     Marland Kitchen omeprazole (PRILOSEC) 20 MG capsule Take 20 mg by mouth daily.    . rosuvastatin (CRESTOR) 20 MG tablet Take 1 tablet (20 mg total) by mouth daily. (Patient not taking: Reported on 10/17/2015) 30 tablet 6  . tamsulosin (FLOMAX)  0.4 MG CAPS capsule Take 1 capsule by mouth daily.    Marland Kitchen triamcinolone cream (KENALOG) 0.1 % as needed.    . valsartan (DIOVAN) 320 MG tablet Take 1 tablet (320 mg total) by mouth daily. 30 tablet 5   No current facility-administered medications for this visit.    Allergies  Allergen Reactions  . Erythromycin   . Penicillins   . Prednisone     Past Medical History  Diagnosis Date  . Atherosclerosis of abdominal aorta (HCC)     CT/abd and pelvis  . Hyperlipidemia   . Coronary artery disease     s/p BM stent, prox and mid RCA, 2001, cutting ballon RCA stenosis, 2002  . Renovascular hypertension     s./p. bilateral RA stent implant  . Carotid artery occlusion     bilateral carotid bruit, right greater then left -bilateral 40-50% stenosis, 12/26/09- no change  . Left  kidney mass     lower pole, observing by urology- Dr. Reece Agar  . Chronic anxiety   . History of renal angiogram     9/10, showed patent renal stents, 30-40% instent restenosis ws the most severe lesion  . History of echocardiogram     2/11 echo EF 70%, mild MR, mildly elevated pulmonary pressures 42 mmHg  . Peripheral neuropathy (HCC)     in both feet from nerve compression   . Dysphagia     from esophageal dysmotility-tx with careful eating.   . Subclavian artery stenosis (HCC)     Blood pressure 162/78, pulse 52, height 5\' 9"  (1.753 m), weight 166 lb 6.4 oz (75.479 kg).    [name] PharmD CPP [facility]

## 2016-02-11 ENCOUNTER — Telehealth: Payer: Self-pay | Admitting: Cardiovascular Disease

## 2016-02-11 NOTE — Telephone Encounter (Signed)
Patient reports swelling in legs from knee to feet most days over past week.  States that morning BP is still very good [id number] systolic, but evenings running 140-150s.  Admits to not taking valsartan for past several days because morning pressure was so good.    Explained that swelling most likely from amlodipine and he should stop that for now.  He can cut valsartan in half if he feels the morning pressures are low, but explained that missing those doses is causing his late afternoon BP to rise.  He voiced understanding.  For now he will continue with valsartan and move his daily clonidine dose to 5 pm.  Asked that he call in a week to let us know if edema resolved.

## 2016-02-11 NOTE — Telephone Encounter (Signed)
Pt calling re -BP-changes have caused swelling on both legs and blisters -starting more than a week ago and getting worse-BP getting worse as well

## 2016-03-20 ENCOUNTER — Encounter: Payer: Self-pay | Admitting: Pharmacist Clinician (PhC)/ Clinical Pharmacy Specialist

## 2016-03-20 ENCOUNTER — Ambulatory Visit (INDEPENDENT_AMBULATORY_CARE_PROVIDER_SITE_OTHER): Payer: Medicare Other | Admitting: Pharmacist Clinician (PhC)/ Clinical Pharmacy Specialist

## 2016-03-20 VITALS — BP 138/56 | HR 53 | Ht 69.0 in | Wt 168.0 lb

## 2016-03-20 DIAGNOSIS — I10 Essential (primary) hypertension: Secondary | ICD-10-CM | POA: Diagnosis not present

## 2016-03-20 NOTE — Progress Notes (Signed)
.     03/20/2016 ROMAIN KAISER [birth year] [id number]   HPI:  Edward [name] is a 82 y.o. male patient of Dr Gwenlyn Found, with a PMH below who presents today for hypertension clinic follow up.  He had bilateral renal artery stenosis, with stenting in 2007.  Most recent dopplers in 2015 show no blockages.  He has had hypertension for some time, and has continues to note a pattern of normal morning readings with elevated evening pressures.  We have adjusted medication time of day with little success.  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  That did not give good BP control and I switched him to valsartan 320 mg.  Because of low morning BP readings, he cut that dose to 160 mg, and will even hold that if systolic pressure is < [id number] in the mornings.     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  AM 88-130, PM [id number] systolic.  Diastolic readings all WNL.  10/42 readings for April/May were > 150, all evening readings, with the highest being 173, majority were in 150's  Current antihypertensive medications: valsartan 160 mg qam, metoprolol 50 mg bid, amlodipine 2.5 mg at lunch, clonidine 0.1 mg at 5pm   Wt Readings from Last 3 Encounters:  03/20/16 168 lb (76.204 kg)  01/31/16 166 lb 6.4 oz (75.479 kg)  12/25/15 169 lb 14.4 oz  (77.066 kg)   BP Readings from Last 3 Encounters:  03/20/16 138/56  01/31/16 162/78  12/25/15 176/74   Pulse Readings from Last 3 Encounters:  03/20/16 53  01/31/16 52  12/25/15 52    Current Outpatient Prescriptions  Medication Sig Dispense Refill  . amLODipine (NORVASC) 2.5 MG tablet Take 2.5 mg by mouth daily.    Marland Kitchen aspirin 325 MG tablet Take 325 mg by mouth daily.    Marland Kitchen atorvastatin (LIPITOR) 40 MG tablet Take 40 mg by mouth daily.    . Calcium Carbonate-Vitamin D (CALCIUM PLUS VITAMIN D PO) Take by mouth.    . cloNIDine (CATAPRES) 0.1 MG tablet Take 1 tablet by mouth daily.     . Coenzyme Q10 (CO Q-10) 100 MG CAPS Take 100 mg by mouth daily.    . folic acid (FOLVITE) A999333 MCG tablet Take 400 mcg by mouth daily.    . hydrocortisone (ANUSOL-HC) 2.5 % rectal cream Place 1 application rectally 2 (two) times daily.    Marland Kitchen LORazepam (ATIVAN) 1 MG tablet Take 1 mg by mouth 2 (two) times daily.     . metoprolol (LOPRESSOR) 50 MG tablet TAKE 1 TABLET (50 MG TOTAL) BY MOUTH 2 (TWO) TIMES DAILY. 180 tablet 3  . Omega-3 Fatty Acids (FISH OIL) 1000 MG CAPS Take 1,000 mg by mouth 2 (two) times daily.     Marland Kitchen omeprazole (PRILOSEC) 20 MG capsule Take 20 mg by mouth  daily.    . rosuvastatin (CRESTOR) 20 MG tablet Take 1 tablet (20 mg total) by mouth daily. (Patient not taking: Reported on 10/17/2015) 30 tablet 6  . tamsulosin (FLOMAX) 0.4 MG CAPS capsule Take 1 capsule by mouth daily.    Marland Kitchen triamcinolone cream (KENALOG) 0.1 % as needed.    . valsartan (DIOVAN) 320 MG tablet Take 1 tablet (320 mg total) by mouth daily. (Patient taking differently: Take 160 mg by mouth daily. ) 30 tablet 5   No current facility-administered medications for this visit.    Allergies  Allergen Reactions  . Amlodipine Other (See Comments)    Tolerates 2.5 mg, higher doses cause LEE with blistering  . Erythromycin   . Penicillins   . Prednisone     Past Medical History  Diagnosis Date  . Atherosclerosis of  abdominal aorta (HCC)     CT/abd and pelvis  . Hyperlipidemia   . Coronary artery disease     s/p BM stent, prox and mid RCA, 2001, cutting ballon RCA stenosis, 2002  . Renovascular hypertension     s./p. bilateral RA stent implant  . Carotid artery occlusion     bilateral carotid bruit, right greater then left -bilateral 40-50% stenosis, 12/26/09- no change  . Left kidney mass     lower pole, observing by urology- Dr. Reece Agar  . Chronic anxiety   . History of renal angiogram     9/10, showed patent renal stents, 30-40% instent restenosis ws the most severe lesion  . History of echocardiogram     2/11 echo EF 70%, mild MR, mildly elevated pulmonary pressures 42 mmHg  . Peripheral neuropathy (HCC)     in both feet from nerve compression   . Dysphagia     from esophageal dysmotility-tx with careful eating.   . Subclavian artery stenosis (HCC)     Blood pressure 138/56, pulse 53, height 5\' 9"  (1.753 m), weight 168 lb (76.204 kg).    [name] PharmD CPP [facility]

## 2016-03-20 NOTE — Assessment & Plan Note (Signed)
Today his BP is good in the office, at 138/56.  His home cuff measured within 10 points.  We had previously tried switching times of day to try and decrease evening BP readings, but with no success.  For now, I am not going to add anything to bring his evening numbers down, as I suspect it may cause a further drop is already good morning readings.  He understands that his BP will not always be < Q000111Q systolic, but as long as it does not trend upward and stay high, he should just continue with his current regimen.  He can call with any further concerns and we will see him as needed.

## 2016-03-20 NOTE — Patient Instructions (Signed)
Your blood pressure today is 138/56  (goal is < 150/90)  Check your blood pressure at home daily and keep record of the readings.  Take your BP meds as follows: continue with all current medications  Bring all of your meds, your BP cuff and your record of home blood pressures to your next appointment.  Exercise as you're able, try to walk approximately 30 minutes per day.  Keep salt intake to a minimum, especially watch canned and prepared boxed foods.  Eat more fresh fruits and vegetables and fewer canned items.  Avoid eating in fast food restaurants.    HOW TO TAKE YOUR BLOOD PRESSURE: . Rest 5 minutes before taking your blood pressure. .  Don't smoke or drink caffeinated beverages for at least 30 minutes before. . Take your blood pressure before (not after) you eat. . Sit comfortably with your back supported and both feet on the floor (don't cross your legs). . Elevate your arm to heart level on a table or a desk. . Use the proper sized cuff. It should fit smoothly and snugly around your bare upper arm. There should be enough room to slip a fingertip under the cuff. The bottom edge of the cuff should be 1 inch above the crease of the elbow. . Ideally, take 3 measurements at one sitting and record the average.

## 2016-04-08 ENCOUNTER — Other Ambulatory Visit: Payer: Self-pay

## 2016-04-08 MED ORDER — VALSARTAN 320 MG PO TABS: 320.0000 mg | ORAL_TABLET | Freq: Every day | ORAL | Status: DC

## 2016-06-18 ENCOUNTER — Other Ambulatory Visit: Payer: Self-pay | Admitting: Cardiovascular Disease

## 2016-06-18 NOTE — Telephone Encounter (Signed)
Medication Detail    Disp Refills Start End   valsartan (DIOVAN) 320 MG tablet 90 tablet 0 04/08/2016    Sig - Route: Take 1 tablet (320 mg total) by mouth daily. - Oral   E-Prescribing Status: Receipt confirmed by pharmacy (04/08/2016 11:50 AM EDT)   Pharmacy   [id number] - [place], [place].

## 2016-08-06 ENCOUNTER — Other Ambulatory Visit: Payer: Self-pay | Admitting: Cardiovascular Disease

## 2016-08-06 DIAGNOSIS — I6523 Occlusion and stenosis of bilateral carotid arteries: Secondary | ICD-10-CM

## 2016-08-23 ENCOUNTER — Other Ambulatory Visit: Payer: Self-pay | Admitting: Cardiovascular Disease

## 2016-09-03 ENCOUNTER — Ambulatory Visit (HOSPITAL_COMMUNITY)
Admission: RE | Admit: 2016-09-03 | Discharge: 2016-09-03 | Disposition: A | Discharge status: Home / Self Care | Payer: Medicare Other | Source: Ambulatory Visit | Attending: Cardiovascular Disease | Admitting: Cardiovascular Disease

## 2016-09-03 DIAGNOSIS — E041 Nontoxic single thyroid nodule: Secondary | ICD-10-CM | POA: Insufficient documentation

## 2016-09-03 DIAGNOSIS — I6523 Occlusion and stenosis of bilateral carotid arteries: Secondary | ICD-10-CM | POA: Diagnosis not present

## 2016-09-03 DIAGNOSIS — E785 Hyperlipidemia, unspecified: Secondary | ICD-10-CM | POA: Insufficient documentation

## 2016-09-03 DIAGNOSIS — I2510 Atherosclerotic heart disease of native coronary artery without angina pectoris: Secondary | ICD-10-CM | POA: Diagnosis not present

## 2016-09-03 DIAGNOSIS — I10 Essential (primary) hypertension: Secondary | ICD-10-CM | POA: Diagnosis not present

## 2016-09-03 DIAGNOSIS — G458 Other transient cerebral ischemic attacks and related syndromes: Secondary | ICD-10-CM | POA: Insufficient documentation

## 2016-09-03 DIAGNOSIS — I739 Peripheral vascular disease, unspecified: Secondary | ICD-10-CM | POA: Insufficient documentation

## 2016-09-09 ENCOUNTER — Telehealth: Payer: Self-pay | Admitting: *Deleted

## 2016-09-09 DIAGNOSIS — I6529 Occlusion and stenosis of unspecified carotid artery: Secondary | ICD-10-CM

## 2016-09-09 NOTE — Telephone Encounter (Signed)
-----   Message from Lorretta Harp, MD sent at 09/07/2016  5:02 PM EST ----- No change from prior study. Repeat in 12 months.. Asymmetric UE BP. BP should only be taken in L arm. Does pt have any RUE claudication

## 2016-09-17 ENCOUNTER — Other Ambulatory Visit: Payer: Self-pay | Admitting: Cardiovascular Disease

## 2016-10-15 ENCOUNTER — Telehealth: Payer: Self-pay | Admitting: Cardiovascular Disease

## 2016-10-15 NOTE — Telephone Encounter (Signed)
Patient returned call going out to lunch around 1115   Call after 12:30 please

## 2016-10-15 NOTE — Telephone Encounter (Signed)
Pt is returning call.  

## 2016-10-15 NOTE — Telephone Encounter (Signed)
Line busy when dialed. 

## 2016-10-15 NOTE — Telephone Encounter (Signed)
BP running high in evenings notes BP running this high for about 1 year  last night was 193/95 & he was especially concerned due to this being "in the danger zone"  Asked about symptoms. 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 Informed me he and family prayed last night that the elevated BP would get better, and BP was improved by morning. This is reflective of his trends of normal BPs in AM, higher in PM.  He has been seen several times for BP management and med concerns. notes this has been followed in clinic by [facility]. I offered to have pharmD review.  After discussion he expressed that he prefer not to see pharmD at this time, & would wait until Friday to follow up on this w Dr. Gwenlyn Found.

## 2016-10-15 NOTE — Telephone Encounter (Signed)
New message  Pt c/o BP issue: STAT if pt c/o blurred vision, one-sided weakness or slurred speech  1. What are your last 5 BP readings? 12/10 am 121/64 pm 173/80.Marland Kitchen... 12/11 am 131/64 pm 187/84.... 12/12 am 150/68 pm 193/95.... 12/13 am 135/71  2. Are you having any other symptoms (ex. Dizziness, headache, blurred vision, passed out)? No   3. What is your BP issue? Per pt states his bp increased and he would like to speak with RN. Pt states he has an appt on Friday but wants to speak with RN sooner. Please call back to discuss

## 2016-10-17 ENCOUNTER — Encounter: Payer: Self-pay | Admitting: Cardiovascular Disease

## 2016-10-17 ENCOUNTER — Ambulatory Visit (INDEPENDENT_AMBULATORY_CARE_PROVIDER_SITE_OTHER): Payer: Medicare Other | Admitting: Cardiovascular Disease

## 2016-10-17 VITALS — BP 136/60 | HR 51 | Ht 69.0 in | Wt 174.0 lb

## 2016-10-17 DIAGNOSIS — I10 Essential (primary) hypertension: Secondary | ICD-10-CM | POA: Diagnosis not present

## 2016-10-17 DIAGNOSIS — I701 Atherosclerosis of renal artery: Secondary | ICD-10-CM

## 2016-10-17 DIAGNOSIS — E782 Mixed hyperlipidemia: Secondary | ICD-10-CM

## 2016-10-17 MED ORDER — VALSARTAN 320 MG PO TABS: 160.0000 mg | ORAL_TABLET | Freq: Two times a day (BID) | ORAL | 3 refills | Status: DC

## 2016-10-17 NOTE — Assessment & Plan Note (Signed)
History of hyperlipidemia on statin therapy followed by his PCP 

## 2016-10-17 NOTE — Assessment & Plan Note (Signed)
History of nocturnal hypertension on multiple antihypertensive medications. I have reviewed his blood pressure log which he takes religiously twice a day. His morning blood pressures are under good control but his evening blood pressures tend the spike in the 180-190 range. He has had a reaction to higher doses of amlodipine without rash. He has had some morning hypotension as well. I'm going to break his valsartan up into from 320 mg in the morning to 160 mg by mouth twice a day. He will see mid-level provider back in 3 months for me back in one year

## 2016-10-17 NOTE — Assessment & Plan Note (Signed)
History of moderate bilateral internal carotid artery stenosis by recent duplex 09/03/16.

## 2016-10-17 NOTE — Assessment & Plan Note (Signed)
History of bilateral renal artery stenosis status post bilateral renal artery stenting by Dr. Rolm Baptise 06/18/06. Renal Dopplers performed one year ago revealed the stents to be widely patent.

## 2016-10-17 NOTE — Assessment & Plan Note (Signed)
History of coronary artery disease status post PCI and bare metal metal stenting of his RCA with 2 NIR bare metal stents by Dr. Agapito Games 08/20/00. She denies chest pain or shortness of breath.

## 2016-10-17 NOTE — Patient Instructions (Signed)
Medication Instructions: Take Valsartan 1/2 tablet in the morning and 1/2 tablet in the evening   Testing/Procedures: Your physician has requested that you have a renal artery duplex in January 2018. During this test, an ultrasound is used to evaluate blood flow to the kidneys. Allow one hour for this exam. Do not eat after midnight the day before and avoid carbonated beverages. Take your medications as you usually do.   Follow-Up: We request that you follow-up in: 3 months with an extender and in 12 months with Dr Andria Rhein will receive a reminder letter in the mail two months in advance. If you don't receive a letter, please call our office to schedule the follow-up appointment.  If you need a refill on your cardiac medications before your next appointment, please call your pharmacy.

## 2016-10-17 NOTE — Progress Notes (Signed)
10/17/2016 Edward [name]   02/09/1930  [id number]  Primary Physician Edward Neer, MD Primary Cardiologist: Edward Harp MD Edward [name]  HPI:  [name] is a delightful 82 year old married Caucasian male father of 2 children, grandfather of 5 grandchildren who worked at [employer]  in the past. I last saw him in the office 10/17/15. His primary care physician is Dr. Serita [name]. He was referred by Dr. Casandra [name] for peripheral vascular evaluation but has expressed a desire to follow up with me for his cardiology care as well in the future. He has a history of treated hypertension and hyperlipidemia. He has never smoked. Her father did have A. Edward [name] in his 59s. He had an Edward function in October of 2001 and had 2 NIR bare metal stents placed in his RCA by Dr. Quay [name] 08/20/00. He has had renal artery stenting bilaterally performed by Dr. Rolm [name] 06/18/06. He had carotid Dopplers performed recently that showed moderate bilateral internal carotid artery stenosis with incidentally noted bilateral subclavian artery disease. His vertebral flow was antegrade bilaterally. He is totally asymptomatic. He does measure his blood pressure twice a day and has noticed evening hypertension.   Current Outpatient Prescriptions  Medication Sig Dispense Refill  . amLODipine (NORVASC) 2.5 MG tablet Take 2.5 mg by mouth daily.    Marland Kitchen aspirin 325 MG tablet Take 325 mg by mouth daily.    Marland Kitchen atorvastatin (LIPITOR) 40 MG tablet Take 40 mg by mouth daily.    . Calcium Carbonate-Vitamin D (CALCIUM PLUS VITAMIN D PO) Take by mouth.    . cloNIDine (CATAPRES) 0.1 MG tablet Take 1 tablet by mouth daily.     . Coenzyme Q10 (CO Q-10) 100 MG CAPS Take 100 mg by mouth daily.    . folic acid (FOLVITE) A999333 MCG tablet Take 400 mcg by mouth daily.    . hydrocortisone (ANUSOL-HC) 2.5 % rectal cream Place 1 application rectally 2 (two) times daily.    Marland Kitchen LORazepam (ATIVAN) 1 MG  tablet Take 1 mg by mouth 2 (two) times daily.     . metoprolol (LOPRESSOR) 50 MG tablet TAKE 1 TABLET (50 MG TOTAL) BY MOUTH 2 (TWO) TIMES DAILY. 180 tablet 3  . Omega-3 Fatty Acids (FISH OIL) 1000 MG CAPS Take 1,000 mg by mouth 2 (two) times daily.     Marland Kitchen omeprazole (PRILOSEC) 20 MG capsule Take 20 mg by mouth daily.    . rosuvastatin (CRESTOR) 20 MG tablet Take 1 tablet (20 mg total) by mouth daily. 30 tablet 6  . tamsulosin (FLOMAX) 0.4 MG CAPS capsule Take 1 capsule by mouth daily.    Marland Kitchen triamcinolone cream (KENALOG) 0.1 % as needed.    . valsartan (DIOVAN) 320 MG tablet Take 0.5 tablets (160 mg total) by mouth 2 (two) times daily. 30 tablet 3   No current facility-administered medications for this visit.     Allergies  Allergen Reactions  . Amlodipine Other (See Comments)    Tolerates 2.5 mg, higher doses cause LEE with blistering  . Erythromycin   . Penicillins   . Prednisone     Social History   Social History  . Marital status: Married    Spouse name: N/A  . Number of children: N/A  . Years of education: N/A   Occupational History  . Not on file.   Social History Main Topics  . Smoking status: Never Smoker  . Smokeless tobacco: Never Used  . Alcohol use  No  . Drug use: No  . Sexual activity: Not on file   Other Topics Concern  . Not on file   Social History Narrative  . No narrative on file     Review of Systems: General: negative for chills, fever, night sweats or weight changes.  Cardiovascular: negative for chest pain, dyspnea on exertion, edema, orthopnea, palpitations, paroxysmal nocturnal dyspnea or shortness of breath Dermatological: negative for rash Respiratory: negative for cough or wheezing Urologic: negative for hematuria Abdominal: negative for nausea, vomiting, diarrhea, bright red blood per rectum, melena, or hematemesis Neurologic: negative for visual changes, syncope, or dizziness All other systems reviewed and are otherwise negative  except as noted above.    Blood pressure 136/60, pulse (!) 51, height 5\' 9"  (1.753 m), weight 174 lb (78.9 kg).  General appearance: alert and no distress Neck: no adenopathy, no JVD, supple, symmetrical, trachea midline, thyroid not enlarged, symmetric, no tenderness/mass/nodules and Soft bilateral carotid bruits Lungs: clear to auscultation bilaterally Heart: regular rate and rhythm, S1, S2 normal, no murmur, click, rub or gallop Extremities: extremities normal, atraumatic, no cyanosis or edema  EKG sinus bradycardia at 51 with right bundle-branch block. I personally reviewed this EKG.  ASSESSMENT AND PLAN:   Coronary atherosclerosis of native coronary artery History of coronary artery disease status post PCI and bare metal metal stenting of his RCA with 2 NIR bare metal stents by Dr. Agapito [name] 08/20/00. She denies chest pain or shortness of breath.  Mixed hyperlipidemia History of hyperlipidemia on statin therapy followed by his PCP  Occlusion and stenosis of carotid artery without mention of cerebral [name] History of moderate bilateral internal carotid artery stenosis by recent duplex 09/03/16.  Essential hypertension, benign History of nocturnal hypertension on multiple antihypertensive medications. I have reviewed his blood pressure log which he takes religiously twice a day. His morning blood pressures are under good control but his evening blood pressures tend the spike in the 180-190 range. He has had a reaction to higher doses of amlodipine without rash. He has had some morning hypotension as well. I'm going to break his valsartan up into from 320 mg in the morning to 160 mg by mouth twice a day. He will see mid-level provider back in 3 months for me back in one year  Renal artery stenosis History of bilateral renal artery stenosis status post bilateral renal artery stenting by Dr. Rolm [name] 06/18/06. Renal Dopplers performed one year ago revealed the stents to be widely  patent.      Edward Harp MD FACP,FACC,FAHA, [facility] 10/17/2016 10:17 AM

## 2016-11-14 ENCOUNTER — Ambulatory Visit (HOSPITAL_COMMUNITY)
Admission: RE | Admit: 2016-11-14 | Discharge: 2016-11-14 | Disposition: A | Discharge status: Home / Self Care | Payer: Medicare Other | Source: Ambulatory Visit | Attending: Internal Medicine | Admitting: Internal Medicine

## 2016-11-14 DIAGNOSIS — I708 Atherosclerosis of other arteries: Secondary | ICD-10-CM | POA: Diagnosis not present

## 2016-11-14 DIAGNOSIS — I10 Essential (primary) hypertension: Secondary | ICD-10-CM | POA: Insufficient documentation

## 2016-11-14 DIAGNOSIS — E785 Hyperlipidemia, unspecified: Secondary | ICD-10-CM | POA: Diagnosis not present

## 2016-11-14 DIAGNOSIS — I2510 Atherosclerotic heart disease of native coronary artery without angina pectoris: Secondary | ICD-10-CM | POA: Diagnosis not present

## 2016-11-14 DIAGNOSIS — I701 Atherosclerosis of renal artery: Secondary | ICD-10-CM | POA: Diagnosis not present

## 2016-11-14 DIAGNOSIS — I739 Peripheral vascular disease, unspecified: Secondary | ICD-10-CM | POA: Diagnosis not present

## 2016-11-14 DIAGNOSIS — I774 Celiac artery compression syndrome: Secondary | ICD-10-CM | POA: Insufficient documentation

## 2016-11-16 ENCOUNTER — Other Ambulatory Visit: Payer: Self-pay | Admitting: Cardiovascular Disease

## 2016-11-18 ENCOUNTER — Other Ambulatory Visit: Payer: Self-pay | Admitting: Cardiovascular Disease

## 2016-11-18 ENCOUNTER — Telehealth: Payer: Self-pay | Admitting: Cardiovascular Disease

## 2016-11-18 DIAGNOSIS — I701 Atherosclerosis of renal artery: Secondary | ICD-10-CM

## 2016-11-18 MED ORDER — METOPROLOL TARTRATE 50 MG PO TABS: 50.0000 mg | ORAL_TABLET | Freq: Two times a day (BID) | ORAL | 3 refills | Status: DC

## 2016-11-18 NOTE — Telephone Encounter (Signed)
New Message   *STAT* If patient is at the pharmacy, call can be transferred to refill team.   1. Which medications need to be refilled? (please list name of each medication and dose if known) Metoprolol (Lopressor) 50 mg tablets twice daily  2. Which pharmacy/location (including street and city if local pharmacy) is medication to be sent to? [facility] [address] [place]  3. Do they need a 30 day or 90 day supply? 90 day supply

## 2016-11-18 NOTE — Telephone Encounter (Signed)
Refill sent.

## 2016-11-23 ENCOUNTER — Other Ambulatory Visit: Payer: Self-pay | Admitting: Cardiovascular Disease

## 2016-11-24 ENCOUNTER — Telehealth: Payer: Self-pay | Admitting: Pharmacist Clinician (PhC)/ Clinical Pharmacy Specialist

## 2016-11-24 ENCOUNTER — Other Ambulatory Visit: Payer: Self-pay | Admitting: Pharmacist

## 2016-11-24 ENCOUNTER — Other Ambulatory Visit: Payer: Self-pay | Admitting: *Deleted

## 2016-11-24 MED ORDER — VALSARTAN 320 MG PO TABS: 160.0000 mg | ORAL_TABLET | Freq: Two times a day (BID) | ORAL | 11 refills | Status: DC

## 2016-11-24 NOTE — Telephone Encounter (Signed)
-----   Message from Edward [name] sent at 11/18/2016  4:00 PM EST ----- Results given to pt. Pt verbalized understanding. Repeat order entered. Pt stated BP has not improved. Sometimes BP is low with systolic at 93, and is always elevated in the evenings. Last night systolic over [id number]. Told pt I would send a message to pharmacy and Dr. Gwenlyn Found and give him a call. Pt was expressed thanks.

## 2016-11-24 NOTE — Telephone Encounter (Signed)
Medication Detail    Disp Refills Start End   valsartan (DIOVAN) 320 MG tablet 30 tablet 3 10/17/2016    Sig - Route: Take 0.5 tablets (160 mg total) by mouth 2 (two) times daily. - Oral   E-Prescribing Status: Receipt confirmed by pharmacy (10/17/2016 10:14 AM EST)   Pharmacy   [id number] - [place], [address]   Another refill sent to pharmacy.

## 2016-11-24 NOTE — Telephone Encounter (Signed)
Spoke with patient regarding blood pressure issues.  Patient notes that morning pressure runs mostly [id number] systolic, although he has had 2-3 readings as low as 96 systolic.  However in the evenings he notes it usually runs 150-160's.    Current medications include amlodipine 2.5 mg daily at noon, metoprolol 50 mg bid, valsartan 160 mg bid and clonidine 0.1 mg about 5 pm.     Spent 10 minutes with patient on phone, trying to determine if any lifestyle elements could be the cause of elevated evening pressures.  Patient does eat lunch in restaurants most days, but even on days that he was unable to last week (due to snow), his pressure remained high in the evenings.    For now I have asked him to increase the clonidine to 0.2 mg daily at 5 pm.  If his pressure remains unchanged will look at any possible caffeine consumption in the later part of the day as well as types of foods.  Could also consider moving clonidine dose to earlier in the day.

## 2016-11-24 NOTE — Telephone Encounter (Signed)
Patient left a msg on the refill vm questioning why the valsartan refill request was refused. He did not leave a call back number. Thanks, MI

## 2016-12-04 ENCOUNTER — Telehealth: Payer: Self-pay | Admitting: Pharmacist Clinician (PhC)/ Clinical Pharmacy Specialist

## 2016-12-04 MED ORDER — CLONIDINE HCL 0.1 MG PO TABS: 0.2000 mg | ORAL_TABLET | Freq: Every day | ORAL | 3 refills | Status: DC

## 2016-12-04 NOTE — Telephone Encounter (Signed)
Spoke with patient.  He reports improvements in his home BP readings.  Evenings readings have dropped to 130/60 range consistently in past week.  Does complain of dry mouth with the increased dose of clonidine and occasional LEE and red welts from the amlodipine.    Advised that he continue with current regimen.  Should he have days where systolic reading is < [id number], he will hold that day dose of amlodipine.  Call in 2-3 weeks to let us know how he's doing.  Patient voiced understanding

## 2016-12-15 ENCOUNTER — Other Ambulatory Visit: Payer: Self-pay | Admitting: Cardiovascular Disease

## 2016-12-16 NOTE — Telephone Encounter (Signed)
REFILL 

## 2016-12-23 ENCOUNTER — Telehealth: Payer: Self-pay | Admitting: Pharmacist Clinician (PhC)/ Clinical Pharmacy Specialist

## 2016-12-23 MED ORDER — CLONIDINE HCL 0.1 MG PO TABS: 0.2000 mg | ORAL_TABLET | Freq: Every day | ORAL | 1 refills | Status: DC

## 2016-12-23 MED ORDER — CLONIDINE HCL 0.1 MG PO TABS
0.2000 mg | ORAL_TABLET | Freq: Every day | ORAL | 1 refills | Status: DC
Start: 2016-12-23 — End: 2017-06-25

## 2016-12-23 NOTE — Telephone Encounter (Signed)
Patient called to report home blood pressures.    Meds:  Valsartan 160 mg bid  Metoprolol tart 50 mg bid  Clonidine 0.2 mg 5 pm daily  Amlodipine 2.5 mg qhs  Home readings in the morning, still averaging [id number] systolic, although does occasionally go down to 95-110 (these low readings make him feel "washed out")  Evening readings show high of 161, 2 readings in the 150 range and the majority in the [id number] systolic range  Will make no changes to his medications at this time.  If we try to continue lowering evening readings I'm afraid we will get some lowering of already stable morning numbers.    Rx for clonidine sent to pharmacy

## 2017-01-02 ENCOUNTER — Ambulatory Visit (INDEPENDENT_AMBULATORY_CARE_PROVIDER_SITE_OTHER): Payer: Medicare Other | Admitting: Physician Assistant

## 2017-01-02 ENCOUNTER — Encounter: Payer: Self-pay | Admitting: Physician Assistant

## 2017-01-02 VITALS — BP 158/82 | HR 53 | Ht 69.0 in | Wt 174.8 lb

## 2017-01-02 DIAGNOSIS — I6523 Occlusion and stenosis of bilateral carotid arteries: Secondary | ICD-10-CM | POA: Diagnosis not present

## 2017-01-02 DIAGNOSIS — I10 Essential (primary) hypertension: Secondary | ICD-10-CM | POA: Diagnosis not present

## 2017-01-02 DIAGNOSIS — R0609 Other forms of dyspnea: Secondary | ICD-10-CM | POA: Diagnosis not present

## 2017-01-02 DIAGNOSIS — I2510 Atherosclerotic heart disease of native coronary artery without angina pectoris: Secondary | ICD-10-CM | POA: Diagnosis not present

## 2017-01-02 NOTE — Patient Instructions (Addendum)
Medication Instructions:  INCREASE Norvasc (Amlodipine) to 10 mg Take 1 tablet once a day; if you start to experience any side effects go back down to half tab once a day and call the office  Labwork: None   Testing/Procedures: None   Follow-Up: Your physician recommends that you schedule a follow-up appointment in: [place] physician wants you to follow-up in: December 2018 WITH DR BERRY. You will receive a reminder letter in the mail two months in advance. If you don't receive a letter, please call our office to schedule the follow-up appointment.  Any Other Special Instructions Will Be Listed Below (If Applicable).     If you need a refill on your cardiac medications before your next appointment, please call your pharmacy.

## 2017-01-02 NOTE — Progress Notes (Signed)
Cardiology Office Note   Date:  01/02/2017   ID:  Edward [name], DOB 06-12-1930, MRN [id number]  PCP:  Mayra Neer, MD  Cardiologist:  Dr Gwenlyn Found 10/17/2016  [name], PA-C   Chief Complaint  Patient presents with  . Follow-up    Bp    History of Present Illness: Edward [name] is a 83 y.o. male with a history of RA stents 2007, HTN, MI s/p BMS RCA 2001, EF 70% 2011, carotid dz, subclavian dz, HLD  Edward [name] presents for cardiology follow up.  He is busy every day. He is busy every day. His feet are sore so he does not walk for exercise. He cleans around the house. He does 25 situps and 25 pushups. He does not get chest pain or SOB with this. However, he is not able to do as much as he wishes. When he walks up hills or up steps, he will get winded but not get chest pain.   His weight has not changed much, Dr Brigitte Pulse wanted him to gain a little and he did so.   He does not get palpitations. No hx presyncope or syncope. No new dyspnea on exertion, no orthopnea, no PND.  His SBP is consistently high in the evening. It is between 20 and 50 points higher. He takes the metoprolol 50 mg, 1/2 of Valsartan 320 mg and clonidine 0.2 mg at 5 pm. He was noted to have problems with a higher dose of amlodipine but was taking a high dose of valsartan at the same time. He tolerates the valsartan at 1/2 tab at a time.  He is not symptomatic with the higher blood pressures, but he worries about it because he knows he has both vascular disease in coronary artery disease. He works at eating a low cholesterol diet and otherwise maintaining his health. He has an exercise bike, but has not been riding.    Past Medical History:  Diagnosis Date  . Atherosclerosis of abdominal aorta (HCC)    CT/abd and pelvis  . Carotid artery occlusion    bilateral carotid bruit, right greater then left -bilateral 40-50% stenosis, 12/26/09- no change  . Chronic anxiety   . Coronary artery disease    s/p  BM stent, prox and mid RCA, 2001, cutting ballon RCA stenosis, 2002  . Dysphagia    from esophageal dysmotility-tx with careful eating.   Marland Kitchen History of echocardiogram    2/11 echo EF 70%, mild MR, mildly elevated pulmonary pressures 42 mmHg  . History of renal angiogram    9/10, showed patent renal stents, 30-40% instent restenosis ws the most severe lesion  . Hyperlipidemia   . Left kidney mass    lower pole, observing by urology- Dr. Reece Agar  . Peripheral neuropathy (HCC)    in both feet from nerve compression   . Renovascular hypertension    s./p. bilateral RA stent implant  . Subclavian artery stenosis [facility])     Past Surgical History:  Procedure Laterality Date  . CARDIAC CATHETERIZATION  2001   BM stent, prox and mid RCA  . cataract surgery Bilateral 04/14  . COLONOSCOPY     every 10 years  . left foot surgery    . RENAL ARTERY STENT  08/07    Medication Sig  . amLODipine (NORVASC) 10 MG tablet TAKE 1 TABLET (10 MG TOTAL) BY MOUTH DAILY.  Marland Kitchen aspirin 325 MG tablet Take 325 mg by mouth daily.  Marland Kitchen atorvastatin (LIPITOR) 40 MG  tablet Take 40 mg by mouth daily.  . Calcium Carbonate-Vitamin D (CALCIUM PLUS VITAMIN D PO) Take 1 tablet by mouth daily.   . cloNIDine (CATAPRES) 0.1 MG tablet Take 2 tablets (0.2 mg total) by mouth daily.  . Coenzyme Q10 (CO Q-10) 100 MG CAPS Take 100 mg by mouth daily.  . finasteride (PROSCAR) 5 MG tablet Take 1 tablet by mouth daily.  . folic acid (FOLVITE) A999333 MCG tablet Take 400 mcg by mouth daily.  . hydrocortisone (ANUSOL-HC) 2.5 % rectal cream Place 1 application rectally 2 (two) times daily.  Marland Kitchen LORazepam (ATIVAN) 1 MG tablet Take 1 mg by mouth 2 (two) times daily.   . metoprolol (LOPRESSOR) 50 MG tablet TAKE 1 TABLET (50 MG TOTAL) BY MOUTH 2 (TWO) TIMES DAILY.  Marland Kitchen Omega-3 Fatty Acids (FISH OIL) 1000 MG CAPS Take 1,000 mg by mouth 2 (two) times daily.   Marland Kitchen omeprazole (PRILOSEC) 20 MG capsule Take 20 mg by mouth daily.  . rosuvastatin (CRESTOR)  20 MG tablet Take 1 tablet (20 mg total) by mouth daily.  . tamsulosin (FLOMAX) 0.4 MG CAPS capsule Take 1 capsule by mouth daily.  Marland Kitchen triamcinolone cream (KENALOG) 0.1 % Apply 1 application topically as needed.   . valsartan (DIOVAN) 320 MG tablet Take 0.5 tablets (160 mg total) by mouth 2 (two) times daily.   No current facility-administered medications for this visit.     Allergies:   Amlodipine; Erythromycin; Penicillins; and Prednisone    Social History:  The patient  reports that he has never smoked. He has never used smokeless tobacco. He reports that he does not drink alcohol or use drugs.   Family History:  The patient's family history includes Hypertension in his father and mother.    ROS:  Please see the history of present illness. All other systems are reviewed and negative.    PHYSICAL EXAM: VS:  BP (!) 158/82   Pulse (!) 53   Ht 5\' 9"  (1.753 m)   Wt 174 lb 12.8 oz (79.3 kg)   BMI 25.81 kg/m  , BMI Body mass index is 25.81 kg/m. GEN: Well nourished, well developed, male in no acute distress  HEENT: normal for age  Neck: no JVD, soft bilateral carotid bruits, no masses Cardiac: RRR; soft murmur, no rubs, or gallops Respiratory:  clear to auscultation bilaterally, normal work of breathing GI: soft, nontender, nondistended, + BS MS: no deformity or atrophy; no edema; distal pulses are 2+ in all 4 extremities   Skin: warm and dry, no rash Neuro:  Strength and sensation are intact Psych: euthymic mood, full affect   EKG:  EKG is not ordered today.   Recent Labs: No results found for requested labs within last 8760 hours.    Lipid Panel    Component Value Date/Time   CHOL 110 03/13/2014 0824   TRIG 91.0 03/13/2014 0824   HDL 40.50 03/13/2014 0824   CHOLHDL 3 03/13/2014 0824   VLDL 18.2 03/13/2014 0824   LDLCALC 51 03/13/2014 0824     Wt Readings from Last 3 Encounters:  01/02/17 174 lb 12.8 oz (79.3 kg)  10/17/16 174 lb (78.9 kg)  03/20/16 168 lb  (76.2 kg)     Other studies Reviewed: Additional studies/ records that were reviewed today include: Office notes and testing.  ASSESSMENT AND PLAN:  1.  Essential hypertension: Taking a half tablet at a time, he is tolerating the high as possible dose of valsartan. He is also on metoprolol 50  mg twice a day but this cannot be increased because of bradycardia. He is currently asymptomatic with a heart rate in the 40s and 50s. He previously had problems with amlodipine, but that was in the setting of taking it with the highest dose of valsartan. He is currently taking the amlodipine separately from the valsartan. -   He actively dislikes the clonidine because of some of the side effects. -   He is asked to try taking the full 10 mg at the amlodipine for couple of days to see if he tolerates it and if it helps his blood pressure. If he does not get side effects continue this. If he does get side effects, we will have to add another medication. -   Hydralazine might be a good choice  2. Dyspnea on exertion: He states that when he is trying to solve all as with the chainsaw, cut down trees or other strenuous activity, he only lasts about an hour and used to able to do this all day.     When I discussed with him his activity level, it turns out that he does not do sustained cardiovascular exercise. He is very busy and active and does some isometric exercises, which do help. He is active around the house and in the yard and does some things that are fairly strenuous. -   I advised him that his target heart rate was between 105 in the 110. If he can start using the exercise bicycle, perhaps by moving it to a more accessible area of the house, he could build up his fitness level and perhaps work outside a little bit longer.  3. Bilateral carotid artery stenosis: He is not having any symptoms. He is to get repeat carotid Dopplers in a year. Do this and follow-up with Dr. Gwenlyn Found as scheduled.  4. CAD: He has  had stents in the past, a Myoview in January 2017 showed a possible defect versus diaphragmatic attenuation. He is having no ischemic symptoms. He is on good therapy with aspirin, Lipitor, beta blocker and ARB. His EF was normal on the stress test. Continue current therapy.   Current medicines are reviewed at length with the patient today.  The patient has concerns regarding medicines. Concerns were addressed  The following changes have been made:  Try increasing amlodipine  Labs/ tests ordered today include:  No orders of the defined types were placed in this encounter.    Disposition:   FU with [facility]pert[facility] in 3 weeks and with Dr. Gwenlyn Found as scheduled  Signed, Lenoard Aden  01/02/2017 4:16 PM    [place] Phone: [phone]; Fax: [phone]  This note was written with the assistance of speech recognition software. Please excuse any transcriptional errors.

## 2017-01-15 ENCOUNTER — Ambulatory Visit: Payer: Medicare Other | Admitting: Physician Assistant

## 2017-01-22 ENCOUNTER — Encounter: Payer: Self-pay | Admitting: Pharmacist Clinician (PhC)/ Clinical Pharmacy Specialist

## 2017-01-22 ENCOUNTER — Ambulatory Visit (INDEPENDENT_AMBULATORY_CARE_PROVIDER_SITE_OTHER): Payer: Medicare Other | Admitting: Pharmacist Clinician (PhC)/ Clinical Pharmacy Specialist

## 2017-01-22 VITALS — BP 122/60 | HR 56

## 2017-01-22 DIAGNOSIS — I10 Essential (primary) hypertension: Secondary | ICD-10-CM

## 2017-01-22 MED ORDER — CHLORTHALIDONE 25 MG PO TABS: 25.0000 mg | ORAL_TABLET | Freq: Every day | ORAL | 3 refills | Status: DC

## 2017-01-22 NOTE — Progress Notes (Signed)
.     01/22/2017 SAFAL HALDERMAN 11/13/1929 [id number]   HPI:  KI CORBO is a 83 y.o. male patient of Dr Gwenlyn Found, with a PMH below who presents today for hypertension clinic follow up.  He had bilateral renal artery stenosis, with stenting in 2007.  Most recent dopplers in 2015 show no new renal blockages.  He has had hypertension for some time, and has continues to note a pattern of normal morning readings with elevated evening pressures.  We have adjusted medication time of day with little success.   In the past he was noted to have red spots and blistering with 5 mg amlodipine, however not with the 2.5 mg dose. He has tolerated this dose well, but was recently encouraged to increase the dose because of continued elevated blood pressure.  Since then he has noticed the spots reappearing on his chest.  He also complains about the clonidine, as it causes him dry mouth and drowsiness.    He has been feeling well, no chest pain, shortness of breath or lower extremity edema.  His only concern is that his heart rate went up into the 80's for about 24 hours after a dental appointment last week.    We don't have any current lab reports on Mr. Seeling, will get a copy of his most recent BMET from Dr. Serita Grammes at [facility].    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  Diastolic readings mostly WNL.    Current  antihypertensive medications:  Valsartan 160 mg bid             Metoprolol tart 50 mg bid             Clonidine 0.2 mg 5 pm daily             Amlodipine 2.5 mg qhs  Wt Readings from Last 3 Encounters:  01/02/17 174 lb 12.8 oz (79.3 kg)  10/17/16 174 lb (78.9 kg)  03/20/16 168 lb (76.2 kg)   BP Readings from Last 3 Encounters:  01/22/17 122/60  01/02/17 (!) 158/82  10/17/16 136/60   Pulse Readings from Last 3 Encounters:  01/22/17 (!) 56  01/02/17 (!) 53  10/17/16 (!) 51    Current Outpatient Prescriptions  Medication Sig Dispense Refill  . amLODipine (NORVASC) 10 MG tablet TAKE 1 TABLET (10 MG TOTAL) BY MOUTH DAILY. 90 tablet 3  . aspirin 325 MG tablet Take 325 mg by mouth daily.    Marland Kitchen atorvastatin (LIPITOR) 40 MG tablet Take 40 mg by mouth daily.    . Calcium Carbonate-Vitamin D (CALCIUM PLUS VITAMIN D PO) Take 1 tablet by mouth daily.     . chlorthalidone (HYGROTON) 25 MG tablet Take 1 tablet (25 mg total) by mouth daily. 30 tablet 3  . cloNIDine (CATAPRES) 0.1 MG tablet Take 2 tablets (0.2 mg total) by mouth daily. 180 tablet 1  . Coenzyme Q10 (  CO Q-10) 100 MG CAPS Take 100 mg by mouth daily.    . finasteride (PROSCAR) 5 MG tablet Take 1 tablet by mouth daily.    . folic acid (FOLVITE) 678 MCG tablet Take 400 mcg by mouth daily.    . hydrocortisone (ANUSOL-HC) 2.5 % rectal cream Place 1 application rectally 2 (two) times daily.    Marland Kitchen LORazepam (ATIVAN) 1 MG tablet Take 1 mg by mouth 2 (two) times daily.     . metoprolol (LOPRESSOR) 50 MG tablet TAKE 1 TABLET (50 MG TOTAL) BY MOUTH 2 (TWO) TIMES DAILY. 180 tablet 3  . Omega-3 Fatty Acids (FISH OIL) 1000 MG CAPS Take 1,000 mg by mouth 2 (two) times daily.     Marland Kitchen omeprazole (PRILOSEC) 20 MG capsule Take 20 mg by mouth daily.    . rosuvastatin (CRESTOR) 20 MG tablet Take 1 tablet (20 mg total) by mouth daily. 30 tablet 6  . tamsulosin (FLOMAX) 0.4 MG CAPS capsule Take 1 capsule by mouth daily.    Marland Kitchen triamcinolone cream (KENALOG)  0.1 % Apply 1 application topically as needed.     . valsartan (DIOVAN) 320 MG tablet Take 0.5 tablets (160 mg total) by mouth 2 (two) times daily. 30 tablet 11   No current facility-administered medications for this visit.     Allergies  Allergen Reactions  . Amlodipine Other (See Comments)    Tolerates 2.5 mg, higher doses cause LEE with blistering  . Erythromycin   . Penicillins   . Prednisone     Past Medical History:  Diagnosis Date  . Atherosclerosis of abdominal aorta (HCC)    CT/abd and pelvis  . Carotid artery occlusion    bilateral carotid bruit, right greater then left -bilateral 40-50% stenosis, 12/26/09- no change  . Chronic anxiety   . Coronary artery disease    s/p BM stent, prox and mid RCA, 2001, cutting ballon RCA stenosis, 2002  . Dysphagia    from esophageal dysmotility-tx with careful eating.   Marland Kitchen History of echocardiogram    2/11 echo EF 70%, mild MR, mildly elevated pulmonary pressures 42 mmHg  . History of renal angiogram    9/10, showed patent renal stents, 30-40% instent restenosis ws the most severe lesion  . Hyperlipidemia   . Left kidney mass    lower pole, observing by urology- Dr. Reece Agar  . Peripheral neuropathy (HCC)    in both feet from nerve compression   . Renovascular hypertension    s./p. bilateral RA stent implant  . Subclavian artery stenosis (HCC)     Blood pressure 122/60, pulse (!) 56.  Assessment/Plan:  Patient with BP that is controlled in office today, although most home readings are still in the 938-101'B systolic.  Because he is unable to tolerate more than 2.5 mg of amlodipine, and he does not wish to take clonidine much longer, I am going to d/c the amlodipine and start him on chlorthalidone 25 mg daily.  My hope is that after a few weeks, we can also discontinue the clonidine.  If he continues to have some elevated pressures, we can also consider switching the metoprolol to carvedilol, which has some better BP control.  He  will repeat BMET in about 2 weeks and will see him back here in 4 weeks.    [name] PharmD CPP [facility]

## 2017-01-22 NOTE — Assessment & Plan Note (Signed)
Patient with BP that is controlled in office today, although most home readings are still in the 735-789'B systolic.  Because he is unable to tolerate more than 2.5 mg of amlodipine, and he does not wish to take clonidine much longer, I am going to d/c the amlodipine and start him on chlorthalidone 25 mg daily.  My hope is that after a few weeks, we can also discontinue the clonidine.  If he continues to have some elevated pressures, we can also consider switching the metoprolol to carvedilol, which has some better BP control.  He will repeat BMET in about 2 weeks and will see him back here in 4 weeks.

## 2017-01-22 NOTE — Patient Instructions (Addendum)
Return for a a follow up appointment in 3-4 weeks  Your blood pressure today is  122/60   (goal is <130/80)  Check your blood pressure at home daily (if able) and keep record of the readings.  Take your BP meds as follows:  Will call you once I get your lab results, to determine which medication to start you on.  Stop amlodipine and start chlorthalidone 25 mg once daily.  Repeat labs in 2 weeks   Bring all of your meds, your BP cuff and your record of home blood pressures to your next appointment.  Exercise as you're able, try to walk approximately 30 minutes per day.  Keep salt intake to a minimum, especially watch canned and prepared boxed foods.  Eat more fresh fruits and vegetables and fewer canned items.  Avoid eating in fast food restaurants.    HOW TO TAKE YOUR BLOOD PRESSURE: . Rest 5 minutes before taking your blood pressure. .  Don't smoke or drink caffeinated beverages for at least 30 minutes before. . Take your blood pressure before (not after) you eat. . Sit comfortably with your back supported and both feet on the floor (don't cross your legs). . Elevate your arm to heart level on a table or a desk. . Use the proper sized cuff. It should fit smoothly and snugly around your bare upper arm. There should be enough room to slip a fingertip under the cuff. The bottom edge of the cuff should be 1 inch above the crease of the elbow. . Ideally, take 3 measurements at one sitting and record the average.

## 2017-02-16 ENCOUNTER — Other Ambulatory Visit: Payer: Self-pay | Admitting: Cardiovascular Disease

## 2017-02-16 LAB — BASIC METABOLIC PANEL
BUN: 30 mg/dL — AB (ref 7–25)
CO2: 23 mmol/L (ref 20–31)
CREATININE: 1.55 mg/dL — AB (ref 0.70–1.11)
Calcium: 9.3 mg/dL (ref 8.6–10.3)
Chloride: 99 mmol/L (ref 98–110)
Glucose, Bld: 82 mg/dL (ref 65–99)
POTASSIUM: 5.1 mmol/L (ref 3.5–5.3)
Sodium: 132 mmol/L — ABNORMAL LOW (ref 135–146)

## 2017-02-19 ENCOUNTER — Ambulatory Visit (INDEPENDENT_AMBULATORY_CARE_PROVIDER_SITE_OTHER): Payer: Medicare Other | Admitting: Pharmacist

## 2017-02-19 VITALS — BP 158/68 | HR 48

## 2017-02-19 DIAGNOSIS — I10 Essential (primary) hypertension: Secondary | ICD-10-CM

## 2017-02-19 NOTE — Progress Notes (Signed)
Patient ID: TRUMAN ACEITUNO                 DOB: 12/14/1929                      MRN: [id number]     HPI: Edward [name] is a 83 y.o. male patient of Dr Gwenlyn Found, with a PMH below who presents today for hypertension clinic follow up.  He had bilateral renal artery stenosis, with stenting in 2007.  Most recent dopplers in 2015 show no new renal blockages.  He has had hypertension for some time, and has continues to note a pattern of normal morning readings with elevated evening pressures.  We have adjusted medication time of day with little success.      He has been feeling well, no chest pain, and shortness of breath or lower extremity edema.  BMET completed 4 weeks after initiating chlorthalidone show slight increased SCr from 1.40 to 1.55 with K level stable with in normal limits.    Patient stated chlorthalidone 25mg  caused some gas at the beginning but improved since initiation and he has decreased swelling in legs.  Current HTN meds:  Valsartan 160 mg twice daily Metoprolol tart 50 mg twice daily Clonidine 0.2 mg 5 pm daily - causing dry mouth and drowsiness Chlorthalidone 25mg  daily   Previously tried:  Amlodipine  - skin blisters  BP goal: <140/90  Family History: no significant family history  Social History: no tobacco (despite working at [employer] for much of his career); no alcohol, no caffeine  Diet: eats lunch out most days, prefers [employer], tries to order fresh vegetables whenever possible; dose not add salt,  Exercise: unable to walk much because of foot problems, but does 25 sit ups and 25 push ups each morning; 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 Atherosclerosis of abdominal aorta (HCC)    CT/abd and pelvis  . Carotid artery occlusion    bilateral carotid bruit, right greater then left -bilateral 40-50% stenosis, 12/26/09- no change  . Chronic anxiety   . Coronary artery disease    s/p BM stent, prox and mid RCA, 2001, cutting ballon RCA stenosis, 2002  . Dysphagia    from esophageal dysmotility-tx with careful eating.   Marland Kitchen History of echocardiogram    2/11 echo EF 70%, mild MR, mildly elevated pulmonary pressures 42 mmHg  . History of renal angiogram    9/10, showed patent renal stents, 30-40% instent restenosis ws the most severe lesion  . Hyperlipidemia   . Left kidney mass    lower pole, observing by urology- Dr. Reece Agar  . Peripheral neuropathy (HCC)    in both feet from nerve compression   . Renovascular hypertension    s./p. bilateral RA stent implant  . Subclavian artery stenosis [facility])     Current Outpatient Prescriptions on File Prior to Visit  Medication Sig Dispense Refill  . amLODipine (NORVASC)  10 MG tablet TAKE 1 TABLET (10 MG TOTAL) BY MOUTH DAILY. 90 tablet 3  . aspirin 325 MG tablet Take 325 mg by mouth daily.    Marland Kitchen atorvastatin (LIPITOR) 40 MG tablet Take 40 mg by mouth daily.    . Calcium Carbonate-Vitamin D (CALCIUM PLUS VITAMIN D PO) Take 1 tablet by mouth daily.     . chlorthalidone (HYGROTON) 25 MG tablet Take 1 tablet (25 mg total) by mouth daily. 30 tablet 3  . cloNIDine (CATAPRES) 0.1 MG tablet Take 2 tablets (0.2 mg total) by mouth daily. 180 tablet 1  . Coenzyme Q10 (CO Q-10) 100 MG CAPS Take 100 mg by mouth daily.    . finasteride (PROSCAR) 5 MG tablet Take 1 tablet by mouth daily.    . folic acid (FOLVITE) 321 MCG tablet Take 400 mcg by mouth daily.    . hydrocortisone (ANUSOL-HC) 2.5 % rectal cream Place 1 application rectally 2 (two) times  daily.    Marland Kitchen LORazepam (ATIVAN) 1 MG tablet Take 1 mg by mouth 2 (two) times daily.     . metoprolol (LOPRESSOR) 50 MG tablet TAKE 1 TABLET (50 MG TOTAL) BY MOUTH 2 (TWO) TIMES DAILY. 180 tablet 3  . Omega-3 Fatty Acids (FISH OIL) 1000 MG CAPS Take 1,000 mg by mouth 2 (two) times daily.     Marland Kitchen omeprazole (PRILOSEC) 20 MG capsule Take 20 mg by mouth daily.    . rosuvastatin (CRESTOR) 20 MG tablet Take 1 tablet (20 mg total) by mouth daily. 30 tablet 6  . tamsulosin (FLOMAX) 0.4 MG CAPS capsule Take 1 capsule by mouth daily.    Marland Kitchen triamcinolone cream (KENALOG) 0.1 % Apply 1 application topically as needed.     . valsartan (DIOVAN) 320 MG tablet Take 0.5 tablets (160 mg total) by mouth 2 (two) times daily. 30 tablet 11   No current facility-administered medications on file prior to visit.     Allergies  Allergen Reactions  . Amlodipine Other (See Comments)    Tolerates 2.5 mg, higher doses cause LEE with blistering  . Erythromycin   . Penicillins   . Prednisone     Blood pressure (!) 158/68, pulse (!) 48, SpO2 98 %.  Essential hypertension:  Blood pressure today is above desired goal of <140/90 but home readings are much improved with and average reading of 130/63.  Blood pressure was better controlled with amlodipine but patient was unable to tolerate mediation.  Also noted slight increase in Scr with chlorthalidone and not appropriate to increase dose at this time.  Changes in BP reading between mornings and evening discussed with patient during office visit.  Some fluctuation is expected as long as systolic BP remains 224-825 and diastolic BP remains 00-37.  Patient also encouraged to keep proper hydration while taking chlorthalidone.  Will decrease clonidine dose from 0.2 to 0.1 daily, and change valsartan administration times to 160mg  at with lunch and 160mg  every evening.  Next f/u in 4 weeks with HTN clinic. Plan to repeat BMET during next office visit as well.  Mae Denunzio Rodriguez-Guzman  PharmD, [place] [id number] 02/19/2017 10:45 AM

## 2017-02-19 NOTE — Patient Instructions (Addendum)
Return for a  follow up appointment in 4 weeks  Your blood pressure today is 158/68 pulse 48   Check your blood pressure at home daily (if able) and keep record of the readings.  Take your BP meds as follows: Valsartan 160 mg twice daily (okay to move morning dose to lunch time) Metoprolol  50 mg twice daily **Decrease Clonidine to  0.1 mg at  5 pm daily** Chlorthalidone 25mg  daily   Bring all of your meds, your BP cuff and your record of home blood pressures to your next appointment.  Exercise as you're able, try to walk approximately 30 minutes per day.  Keep salt intake to a minimum, especially watch canned and prepared boxed foods.  Eat more fresh fruits and vegetables and fewer canned items.  Avoid eating in fast food restaurants.    HOW TO TAKE YOUR BLOOD PRESSURE: . Rest 5 minutes before taking your blood pressure. .  Don't smoke or drink caffeinated beverages for at least 30 minutes before. . Take your blood pressure before (not after) you eat. . Sit comfortably with your back supported and both feet on the floor (don't cross your legs). . Elevate your arm to heart level on a table or a desk. . Use the proper sized cuff. It should fit smoothly and snugly around your bare upper arm. There should be enough room to slip a fingertip under the cuff. The bottom edge of the cuff should be 1 inch above the crease of the elbow. . Ideally, take 3 measurements at one sitting and record the average.

## 2017-02-24 ENCOUNTER — Telehealth: Payer: Self-pay | Admitting: Cardiovascular Disease

## 2017-02-24 MED ORDER — CHLORTHALIDONE 25 MG PO TABS: 25.0000 mg | ORAL_TABLET | Freq: Every day | ORAL | 2 refills | Status: DC

## 2017-02-24 NOTE — Telephone Encounter (Signed)
Rx for Chlorthalidone (90 day supply) sent to [facility] on [address] with 2 refills.

## 2017-03-04 ENCOUNTER — Telehealth: Payer: Self-pay | Admitting: Pharmacist Clinician (PhC)/ Clinical Pharmacy Specialist

## 2017-03-04 NOTE — Telephone Encounter (Signed)
Patient reports home evening BP readings were running high with only clonidine 0.1 mg each evening.  Patient also tried 1.5 tablets, but went back to 2 tabs.  States feels some early morning fatigue, but I assured him that this was not related to his medication regimen.    Has follow up appointment set for May 16.

## 2017-03-18 ENCOUNTER — Ambulatory Visit (INDEPENDENT_AMBULATORY_CARE_PROVIDER_SITE_OTHER): Payer: Medicare Other | Admitting: Pharmacist

## 2017-03-18 ENCOUNTER — Encounter: Payer: Self-pay | Admitting: Pharmacist

## 2017-03-18 VITALS — BP 164/62 | HR 53

## 2017-03-18 DIAGNOSIS — I10 Essential (primary) hypertension: Secondary | ICD-10-CM | POA: Diagnosis not present

## 2017-03-18 NOTE — Progress Notes (Signed)
Patient ID: VUK SKILLERN                 DOB: [birth year]                      MRN: [id number]     HPI: Edward [name] is a 83 y.o. male patient of Dr Gwenlyn Found, with a PMH below who presents today for hypertension clinic follow up.  He had bilateral renal artery stenosis, with stenting in 2007.  Most recent dopplers in 2015 show no new renal blockages.  He has had hypertension for some time, and has continues to note a pattern of normal morning readings with elevated evening pressures.  We have adjusted medication time of day with little success.     He has been feeling well, no chest pain, and shortness of breath or lower extremity edema.  BMET completed 4 weeks after initiating chlorthalidone show slight increased SCr from 1.40 to 1.55 with K and Sodium level stable with in normal limits.  We tried to decrease clonidine 0.2 to 0.1 with potential discontinuation today, but patient resumed daily clonidine 0.2mg  due to elevated BP reading in the evening.  Today patient presents to clinic accompany by spouse. Denies swelling, chest pain, headaches, dizziness, or any other ADR.   Patient expressed  with variable HP readings and lack if stable therapy.  Current HTN meds:  Valsartan 160 mg twice daily (morning and evening) Metoprolol tart 50 mg twice daily Clonidine 0.2 mg at 5 pm daily  Chlorthalidone 25mg  daily   Previously tried:  Amlodipine  - skin blisters  BP goal: <140/90  Family History: no significant family history  Social History: no tobacco (despite working at [employer] for much of his career); no alcohol, no caffeine  Diet: eats lunch out most days, prefers [employer], tries to order fresh vegetables whenever possible; dose not add salt,  Exercise: unable to walk much because of foot problems, but does 25 sit ups and 25 push ups each morning; 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 Atherosclerosis of abdominal aorta (HCC)    CT/abd and pelvis  . Carotid artery occlusion    bilateral carotid bruit, right greater then left -bilateral 40-50% stenosis, 12/26/09- no change  . Chronic anxiety   . Coronary artery disease    s/p BM stent, prox and mid RCA, 2001, cutting ballon RCA stenosis, 2002  . Dysphagia    from esophageal dysmotility-tx with careful eating.   Marland Kitchen History of echocardiogram    2/11 echo EF 70%, mild MR, mildly elevated pulmonary pressures 42 mmHg  . History of renal angiogram    9/10, showed patent renal stents, 30-40% instent restenosis ws the most severe lesion  . Hyperlipidemia   . Left kidney mass    lower pole, observing by urology- Dr. Reece Agar  . Peripheral neuropathy (HCC)    in both feet from nerve compression   .  Renovascular hypertension    s./p. bilateral RA stent implant  . Subclavian artery stenosis [facility])     Current Outpatient Prescriptions on File Prior to Visit  Medication Sig Dispense Refill  . amLODipine (NORVASC) 10 MG tablet TAKE 1 TABLET (10 MG TOTAL) BY MOUTH DAILY. 90 tablet 3  . aspirin 325 MG tablet Take 325 mg by mouth daily.    Marland Kitchen atorvastatin (LIPITOR) 40 MG tablet Take 40 mg by mouth daily.    . Calcium Carbonate-Vitamin D (CALCIUM PLUS VITAMIN D PO) Take 1 tablet by mouth daily.     . chlorthalidone (HYGROTON) 25 MG tablet Take 1 tablet (25 mg total) by mouth daily. 90 tablet 2  . cloNIDine (CATAPRES) 0.1 MG tablet Take 2 tablets (0.2 mg total) by mouth daily. 180 tablet 1  . Coenzyme Q10 (CO Q-10) 100 MG  CAPS Take 100 mg by mouth daily.    . finasteride (PROSCAR) 5 MG tablet Take 1 tablet by mouth daily.    . folic acid (FOLVITE) 694 MCG tablet Take 400 mcg by mouth daily.    . hydrocortisone (ANUSOL-HC) 2.5 % rectal cream Place 1 application rectally 2 (two) times daily.    Marland Kitchen LORazepam (ATIVAN) 1 MG tablet Take 1 mg by mouth 2 (two) times daily.     . metoprolol (LOPRESSOR) 50 MG tablet TAKE 1 TABLET (50 MG TOTAL) BY MOUTH 2 (TWO) TIMES DAILY. 180 tablet 3  . Omega-3 Fatty Acids (FISH OIL) 1000 MG CAPS Take 1,000 mg by mouth 2 (two) times daily.     Marland Kitchen omeprazole (PRILOSEC) 20 MG capsule Take 20 mg by mouth daily.    . rosuvastatin (CRESTOR) 20 MG tablet Take 1 tablet (20 mg total) by mouth daily. 30 tablet 6  . tamsulosin (FLOMAX) 0.4 MG CAPS capsule Take 1 capsule by mouth daily.    Marland Kitchen triamcinolone cream (KENALOG) 0.1 % Apply 1 application topically as needed.     . valsartan (DIOVAN) 320 MG tablet Take 0.5 tablets (160 mg total) by mouth 2 (two) times daily. 30 tablet 11   No current facility-administered medications on file prior to visit.     Allergies  Allergen Reactions  . Amlodipine Other (See Comments)    Tolerates 2.5 mg, higher doses cause LEE with blistering  . Erythromycin   . Penicillins   . Prednisone     Blood pressure (!) 164/62, pulse (!) 53, SpO2 93 %.  Essential hypertension:  Blood pressure remains above goal during office visit but home average improved to 126/60 with pulse ~50.  Patient continues to self-adjust medication occasionally and 1 episode of symptomatic hypotension noted.  His home BP monitor was calibrated and determined to be accurate within 55mm Hg when compared to manual reading.   After extensive review of medication history and therapy changes since 01/02/2017, patient agreed on continuation of current therapy with valsartan 160mg  BID, Metoprolol 50mg  BID, clonidine 0.2mg  every evening, and chlorthalidone 25mg  daily (12.5mg  daily trial x 1 week then  patient to resume 25mg  daily if BP start to trend upward). Next F/U appointment with HTN clinic in 8 weeks to re-assess therapy and repeat BMET.  Gunther Zawadzki Rodriguez-Guzman PharmD, [place] [id number] 03/18/2017 1:59 PM

## 2017-03-18 NOTE — Patient Instructions (Addendum)
Return for a a follow up appointment in 8 weeks  Your blood pressure today is  164/62 pulse 53  Check your blood pressure at home daily (if able) and keep record of the readings.  Take your BP meds as follows: Valsartan 160 mg twice daily (morning and evening) Metoprolol tart 50 mg twice daily Clonidine 0.2 mg at 5 pm daily  Chlorthalidone 25mg  daily (okay to try 1/2 table for 1 week)   Bring all of your meds, your BP cuff and your record of home blood pressures to your next appointment.  Exercise as you're able, try to walk approximately 30 minutes per day.  Keep salt intake to a minimum, especially watch canned and prepared boxed foods.  Eat more fresh fruits and vegetables and fewer canned items.  Avoid eating in fast food restaurants.    HOW TO TAKE YOUR BLOOD PRESSURE: . Rest 5 minutes before taking your blood pressure. .  Don't smoke or drink caffeinated beverages for at least 30 minutes before. . Take your blood pressure before (not after) you eat. . Sit comfortably with your back supported and both feet on the floor (don't cross your legs). . Elevate your arm to heart level on a table or a desk. . Use the proper sized cuff. It should fit smoothly and snugly around your bare upper arm. There should be enough room to slip a fingertip under the cuff. The bottom edge of the cuff should be 1 inch above the crease of the elbow. . Ideally, take 3 measurements at one sitting and record the average.

## 2017-03-19 ENCOUNTER — Ambulatory Visit: Payer: Medicare Other

## 2017-04-27 ENCOUNTER — Encounter (INDEPENDENT_AMBULATORY_CARE_PROVIDER_SITE_OTHER): Payer: Medicare Other | Admitting: Ophthalmology

## 2017-04-27 DIAGNOSIS — H43813 Vitreous degeneration, bilateral: Secondary | ICD-10-CM | POA: Diagnosis not present

## 2017-04-27 DIAGNOSIS — H353132 Nonexudative age-related macular degeneration, bilateral, intermediate dry stage: Secondary | ICD-10-CM

## 2017-04-27 DIAGNOSIS — I10 Essential (primary) hypertension: Secondary | ICD-10-CM | POA: Diagnosis not present

## 2017-04-27 DIAGNOSIS — D3131 Benign neoplasm of right choroid: Secondary | ICD-10-CM | POA: Diagnosis not present

## 2017-04-27 DIAGNOSIS — H35033 Hypertensive retinopathy, bilateral: Secondary | ICD-10-CM

## 2017-05-14 ENCOUNTER — Telehealth: Payer: Self-pay | Admitting: Cardiovascular Disease

## 2017-05-14 NOTE — Telephone Encounter (Signed)
Last office visit was 03/18/2017 with expected F/U in 8 weeks (~ 05/13/2017).   Appointment already scheduled for 05/25/2017 - no new symptoms to discussed today per patient.  BMET repeated at PCP (Dr Alroy Dust -June/25/18)  -  Renal function and electrolytes at goal.

## 2017-05-14 NOTE — Telephone Encounter (Signed)
New Message    Pt has been waiting on a follow up call from Raquel

## 2017-05-25 ENCOUNTER — Ambulatory Visit (INDEPENDENT_AMBULATORY_CARE_PROVIDER_SITE_OTHER): Payer: Medicare Other | Admitting: Pharmacist

## 2017-05-25 VITALS — BP 146/54 | HR 50

## 2017-05-25 DIAGNOSIS — I10 Essential (primary) hypertension: Secondary | ICD-10-CM

## 2017-05-25 NOTE — Patient Instructions (Addendum)
Return for a  follow up appointment as needed - Next f/u with Dr Gwenlyn Found  Your blood pressure today is 146/54 pulse 50  Check your blood pressure at home daily (if able) and keep record of the readings.  Take your BP meds as follows: Valsartan 160 mg twice daily (morning and evening) Metoprolol tart 50 mg twice daily Clonidine 0.2 mg at 5 pm daily  Chlorthalidone 25mg  daily (okay to try 1/2 table for 1 week)  Bring BP cuff and your record of home blood pressures to your next appointment.  Exercise as you're able, try to walk approximately 30 minutes per day.  Keep salt intake to a minimum, especially watch canned and prepared boxed foods.  Eat more fresh fruits and vegetables and fewer canned items.  Avoid eating in fast food restaurants.    HOW TO TAKE YOUR BLOOD PRESSURE: . Rest 5 minutes before taking your blood pressure. .  Don't smoke or drink caffeinated beverages for at least 30 minutes before. . Take your blood pressure before (not after) you eat. . Sit comfortably with your back supported and both feet on the floor (don't cross your legs). . Elevate your arm to heart level on a table or a desk. . Use the proper sized cuff. It should fit smoothly and snugly around your bare upper arm. There should be enough room to slip a fingertip under the cuff. The bottom edge of the cuff should be 1 inch above the crease of the elbow. . Ideally, take 3 measurements at one sitting and record the average.

## 2017-05-25 NOTE — Progress Notes (Signed)
Patient ID: Edward [name]                 DOB: Mar 05, 1930                      MRN: [id number]     HPI: Edward [name] is a 83 y.o. male referred by Dr. Gwenlyn Found to HTN clinic. PMH includes, bilateral renal artery stenosis s/p stenting in 2007, hypertension, CAD and anxiety .He has had hypertension for long time, and has continues to note a pattern of normal morning readings with elevated evening pressures. We tried to decrease clonidine 0.2 to 0.1 with potential discontinuation today, but patient resumed daily clonidine 0.2mg  due to elevated BP reading in the evening. During last office visit long time was spent analyzing recent therapy changes, adverse drug reaction, and response to medication until patient and provider agreed on current therapy.   Today patient presents to clinic accompany by spouse. Denies swelling, chest pain, headaches, dizziness, fatigue or swelling. Patient reports feeling more energetic. Complain of frequent "abmormal heart rhythm" on BP device at home and will like cardiologist evaluation.  Current HTN meds:  Valsartan 160 mg twice daily (morning and evening) Metoprolol tart 50 mg twice daily Clonidine 0.2 mg at 5 pm daily  Chlorthalidone 25mg  daily (okay to try 1/2 table for 1 week)  BP goal: <140/90  Family History: no significant family history  Social History: no tobacco (despite working at [employer] for much of his career); no alcohol, no caffeine  Diet:eats lunch out most days, prefers [employer], tries to order fresh vegetables whenever possible; dose not add salt,  Exercise:unable to walk much because of foot problems, but does 25 sit ups and 25 push ups each morning; 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 Atherosclerosis of abdominal aorta (HCC)    CT/abd and pelvis  . Carotid artery occlusion    bilateral carotid bruit, right greater then left -bilateral 40-50% stenosis, 12/26/09- no change  . Chronic anxiety   . Coronary artery disease    s/p BM stent, prox and mid RCA, 2001, cutting ballon RCA stenosis, 2002  . Dysphagia    from esophageal dysmotility-tx with careful eating.   Marland Kitchen History of echocardiogram    2/11 echo EF 70%, mild MR, mildly elevated pulmonary pressures 42 mmHg  . History of renal angiogram    9/10, showed patent renal stents, 30-40% instent restenosis ws the most severe lesion  . Hyperlipidemia   . Left kidney mass    lower pole, observing by urology- Dr. Reece Agar  . Peripheral neuropathy    in both feet from nerve compression   . Renovascular hypertension    s./p. bilateral RA stent implant  . Subclavian artery stenosis [facility])     Current Outpatient Prescriptions on File Prior to Visit  Medication Sig Dispense Refill  . amLODipine (NORVASC) 10 MG tablet TAKE 1 TABLET (10 MG TOTAL) BY MOUTH DAILY. 90 tablet  3  . aspirin 325 MG tablet Take 325 mg by mouth daily.    Marland Kitchen atorvastatin (LIPITOR) 40 MG tablet Take 40 mg by mouth daily.    . Calcium Carbonate-Vitamin D (CALCIUM PLUS VITAMIN D PO) Take 1 tablet by mouth daily.     . chlorthalidone (HYGROTON) 25 MG tablet Take 1 tablet (25 mg total) by mouth daily. 90 tablet 2  . cloNIDine (CATAPRES) 0.1 MG tablet Take 2 tablets (0.2 mg total) by mouth daily. 180 tablet 1  . Coenzyme Q10 (CO Q-10) 100 MG CAPS Take 100 mg by mouth daily.    . finasteride (PROSCAR) 5 MG tablet Take 1 tablet by mouth daily.    . folic acid (FOLVITE) 315 MCG tablet Take 400 mcg by mouth daily.    . hydrocortisone (ANUSOL-HC) 2.5 % rectal  cream Place 1 application rectally 2 (two) times daily.    Marland Kitchen LORazepam (ATIVAN) 1 MG tablet Take 1 mg by mouth 2 (two) times daily.     . metoprolol (LOPRESSOR) 50 MG tablet TAKE 1 TABLET (50 MG TOTAL) BY MOUTH 2 (TWO) TIMES DAILY. 180 tablet 3  . Omega-3 Fatty Acids (FISH OIL) 1000 MG CAPS Take 1,000 mg by mouth 2 (two) times daily.     Marland Kitchen omeprazole (PRILOSEC) 20 MG capsule Take 20 mg by mouth daily.    . rosuvastatin (CRESTOR) 20 MG tablet Take 1 tablet (20 mg total) by mouth daily. 30 tablet 6  . tamsulosin (FLOMAX) 0.4 MG CAPS capsule Take 1 capsule by mouth daily.    Marland Kitchen triamcinolone cream (KENALOG) 0.1 % Apply 1 application topically as needed.     . valsartan (DIOVAN) 320 MG tablet Take 0.5 tablets (160 mg total) by mouth 2 (two) times daily. 30 tablet 11   No current facility-administered medications on file prior to visit.     Allergies  Allergen Reactions  . Amlodipine Other (See Comments)    Tolerates 2.5 mg, higher doses cause LEE with blistering  . Erythromycin   . Penicillins   . Prednisone     Blood pressure (!) 146/54, pulse (!) 50, SpO2 98 %.  Essential hypertension, benign Blood pressure today remains slightly above goal in clinic but noted to be well controlled at home with an average reading of 134/66. Heart Rate still in the 50s but patient remains asymptomatic. Patient also reports feeling better in general, more energetic and less fatigue. Only complain today is a reading of "abnormal heart rhythm" on his BP home monitor.  Will continue current therapy without changes, continue BP monitoring at home and records. Patient to make f/u appointment with cardiologist to assess "abnormal rhythm". He will prefer to see cardiologist over APP. Appointment made today and placed on cancelation list as well.   Micharl Helmes Rodriguez-Guzman PharmD, [place] [address] 05/26/2017 10:53 AM

## 2017-05-26 NOTE — Assessment & Plan Note (Signed)
Blood pressure today remains slightly above goal in clinic but noted to be well controlled at home with an average reading of 134/66. Heart Rate still in the 50s but patient remains asymptomatic. Patient also reports feeling better in general, more energetic and less fatigue. Only complain today is a reading of "abnormal heart rhythm" on his BP home monitor.  Will continue current therapy without changes, continue BP monitoring at home and records. Patient to make f/u appointment with cardiologist to assess "abnormal rhythm". He will prefer to see cardiologist over APP. Appointment made today and placed on cancelation list as well.

## 2017-05-27 ENCOUNTER — Encounter: Payer: Self-pay | Admitting: Cardiovascular Disease

## 2017-05-27 ENCOUNTER — Ambulatory Visit (INDEPENDENT_AMBULATORY_CARE_PROVIDER_SITE_OTHER): Payer: Medicare Other | Admitting: Cardiovascular Disease

## 2017-05-27 VITALS — BP 146/80 | HR 58 | Ht 68.5 in | Wt 170.0 lb

## 2017-05-27 DIAGNOSIS — R002 Palpitations: Secondary | ICD-10-CM | POA: Diagnosis not present

## 2017-05-27 DIAGNOSIS — I701 Atherosclerosis of renal artery: Secondary | ICD-10-CM | POA: Diagnosis not present

## 2017-05-27 DIAGNOSIS — E782 Mixed hyperlipidemia: Secondary | ICD-10-CM | POA: Diagnosis not present

## 2017-05-27 DIAGNOSIS — I10 Essential (primary) hypertension: Secondary | ICD-10-CM

## 2017-05-27 NOTE — Assessment & Plan Note (Signed)
History of hyperlipidemia on statin therapy followed by his PCP 

## 2017-05-27 NOTE — Assessment & Plan Note (Signed)
History of coronary artery disease status post RCA stenting to near bare metal stents by Dr. Quay Burow 08/20/00. This was preceded by a myocardial infarction. He denies chest pain or shortness of breath.

## 2017-05-27 NOTE — Assessment & Plan Note (Signed)
History of moderate bilateral ICA stenosis followed by 2 proximal ultrasound most recently performed 09/03/16. This will be repeated on an annual basis.

## 2017-05-27 NOTE — Patient Instructions (Signed)
Medication Instructions: Your physician recommends that you continue on your current medications as directed. Please refer to the Current Medication list given to you today.  Labwork: Labwork will be requested from your primary care physician.  Testing/Procedures: Your physician has recommended that you wear a 14 day event monitor. Event monitors are medical devices that record the heart's electrical activity. Doctors most often [place] these monitors to diagnose arrhythmias. Arrhythmias are problems with the speed or rhythm of the heartbeat. The monitor is a small, portable device. You can wear one while you do your normal daily activities. This is usually used to diagnose what is causing palpitations/syncope (passing out).  Follow-Up: Your physician wants you to follow-up in: 1 year with Dr. Gwenlyn Found. You will receive a reminder letter in the mail two months in advance. If you don't receive a letter, please call our office to schedule the follow-up appointment.  If you need a refill on your cardiac medications before your next appointment, please call your pharmacy.

## 2017-05-27 NOTE — Assessment & Plan Note (Signed)
History of bilateral renal stenting by Dr. Jack Quarto 06/18/06. Recent renal Doppler studies performed 11/14/16 revealed stents to be widely patent.

## 2017-05-27 NOTE — Assessment & Plan Note (Signed)
Edward [name] recently has noticed some palpitations without other symptoms. He has PACs on his 12-lead EKG. I am going to get a 2 week event monitor to further evaluate.

## 2017-05-27 NOTE — Assessment & Plan Note (Signed)
History of essential hypertension with blood pressure performed to his left arm of 146/80 (he has a right subclavian stenosis and blood pressure should always be performed on the left side). He is on amlodipine, metoprolol and valsartan. Continue current meds at current dosing

## 2017-05-27 NOTE — Progress Notes (Signed)
05/27/2017 Edward [name]   1930/02/08  [id number]  Primary Physician Mayra Neer, MD Primary Cardiologist: Lorretta Harp MD Renae Gloss  HPI:   Mr. Irish Elders is a delightful 83 year old married Caucasian male father of 2 children, grandfather of 5 grandchildren who worked at [employer] in the past. I last saw him in the office 10/17/16. His primary care physician is Dr. Serita Grammes. He was referred by Dr. Casandra Doffing for peripheral vascular evaluation but has expressed a desire to follow up with me for his cardiology care as well in the future. He has a history of treated hypertension and hyperlipidemia. He has never smoked. Her father did have A. Myocardial infarction in his 70s. He had an myocardial function in October of 2001 and had 2 NIR bare metal stents placed in his RCA by Dr. Quay Burow 08/20/00. He has had renal artery stenting bilaterally performed by Dr. Rolm Baptise 06/18/06. He had carotid Dopplers performed recently that showed moderate bilateral internal carotid artery stenosis with incidentally noted bilateral subclavian artery disease. His vertebral antegrade on the left and apparent on the right. There was a 40-50 mm pressure differential with the right arm being less than the left. He is totally asymptomatic. He does measure his blood pressure twice a day and has noticed evening hypertension.    Current Outpatient Prescriptions  Medication Sig Dispense Refill  . aspirin 325 MG tablet Take 325 mg by mouth daily.    Marland Kitchen atorvastatin (LIPITOR) 40 MG tablet Take 40 mg by mouth daily.    . Calcium Carbonate-Vitamin D (CALCIUM PLUS VITAMIN D PO) Take 1 tablet by mouth daily.     . cloNIDine (CATAPRES) 0.1 MG tablet Take 2 tablets (0.2 mg total) by mouth daily. (Patient taking differently: Take 0.1 mg by mouth daily. ) 180 tablet 1  . Coenzyme Q10 (CO Q-10) 100 MG CAPS Take 100 mg by mouth daily.    . finasteride (PROSCAR) 5 MG tablet Take 1 tablet by mouth  daily.    . folic acid (FOLVITE) 782 MCG tablet Take 400 mcg by mouth daily.    Marland Kitchen LORazepam (ATIVAN) 1 MG tablet Take 1 mg by mouth 2 (two) times daily.     . metoprolol (LOPRESSOR) 50 MG tablet TAKE 1 TABLET (50 MG TOTAL) BY MOUTH 2 (TWO) TIMES DAILY. 180 tablet 3  . Omega-3 Fatty Acids (FISH OIL) 1000 MG CAPS Take 1,000 mg by mouth daily.     Marland Kitchen omeprazole (PRILOSEC) 20 MG capsule Take 20 mg by mouth daily.    . tamsulosin (FLOMAX) 0.4 MG CAPS capsule Take 1 capsule by mouth daily.    Marland Kitchen triamcinolone cream (KENALOG) 0.1 % Apply 1 application topically as needed.     . valsartan (DIOVAN) 320 MG tablet Take 0.5 tablets (160 mg total) by mouth 2 (two) times daily. 30 tablet 11  . chlorthalidone (HYGROTON) 25 MG tablet Take 1 tablet (25 mg total) by mouth daily. 90 tablet 2   No current facility-administered medications for this visit.     Allergies  Allergen Reactions  . Amlodipine Other (See Comments)    Tolerates 2.5 mg, higher doses cause LEE with blistering  . Erythromycin   . Penicillins   . Prednisone     Social History   Social History  . Marital status: Married    Spouse name: N/A  . Number of children: N/A  . Years of education: N/A   Occupational History  . Not  on file.   Social History Main Topics  . Smoking status: Never Smoker  . Smokeless tobacco: Never Used  . Alcohol use No  . Drug use: No  . Sexual activity: Not on file   Other Topics Concern  . Not on file   Social History Narrative  . No narrative on file     Review of Systems: General: negative for chills, fever, night sweats or weight changes.  Cardiovascular: negative for chest pain, dyspnea on exertion, edema, orthopnea, palpitations, paroxysmal nocturnal dyspnea or shortness of breath Dermatological: negative for rash Respiratory: negative for cough or wheezing Urologic: negative for hematuria Abdominal: negative for nausea, vomiting, diarrhea, bright red blood per rectum, melena, or  hematemesis Neurologic: negative for visual changes, syncope, or dizziness All other systems reviewed and are otherwise negative except as noted above.    Blood pressure (!) 146/80, pulse (!) 58, height 5' 8.5" (1.74 m), weight 170 lb (77.1 kg).  General appearance: alert and no distress Neck: no adenopathy, no JVD, supple, symmetrical, trachea midline, thyroid not enlarged, symmetric, no tenderness/mass/nodules and Bilateral carotid bruits and right subclavian bruit Lungs: clear to auscultation bilaterally Heart: regular rate and rhythm, S1, S2 normal, no murmur, click, rub or gallop Extremities: extremities normal, atraumatic, no cyanosis or edema  EKG sinus bradycardia at 58 with right bundle-branch block and occasional PACs. I personally reviewed this EKG  ASSESSMENT AND PLAN:   Coronary atherosclerosis of native coronary artery History of coronary artery disease status post RCA stenting to near bare metal stents by Dr. Quay Burow 08/20/00. This was preceded by a myocardial infarction. He denies chest pain or shortness of breath.  Mixed hyperlipidemia History of hyperlipidemia on statin therapy followed by his PCP  Occlusion and stenosis of carotid artery without mention of cerebral infarction History of moderate bilateral ICA stenosis followed by 2 proximal ultrasound most recently performed 09/03/16. This will be repeated on an annual basis.  Essential hypertension, benign History of essential hypertension with blood pressure performed to his left arm of 146/80 (he has a right subclavian stenosis and blood pressure should always be performed on the left side). He is on amlodipine, metoprolol and valsartan. Continue current meds at current dosing  Renal artery stenosis History of bilateral renal stenting by Dr. Jack Quarto 06/18/06. Recent renal Doppler studies performed 11/14/16 revealed stents to be widely patent.  Palpitations Mr. Lederman recently has noticed some  palpitations without other symptoms. He has PACs on his 12-lead EKG. I am going to get a 2 week event monitor to further evaluate.      Lorretta Harp MD FACP,FACC,FAHA, [facility] 05/27/2017 2:55 PM

## 2017-06-10 ENCOUNTER — Ambulatory Visit (INDEPENDENT_AMBULATORY_CARE_PROVIDER_SITE_OTHER): Payer: Medicare Other

## 2017-06-10 DIAGNOSIS — R002 Palpitations: Secondary | ICD-10-CM

## 2017-06-19 ENCOUNTER — Telehealth: Payer: Self-pay | Admitting: Cardiovascular Disease

## 2017-06-19 MED ORDER — IRBESARTAN 150 MG PO TABS: 150.0000 mg | ORAL_TABLET | Freq: Two times a day (BID) | ORAL | 2 refills | Status: DC

## 2017-06-19 NOTE — Telephone Encounter (Signed)
Pt is taking valsartan 320 mg-pt takes half in the morning and half at 5pm. Start Irbesartan 150mg , twice daily as directed by [facility] current memo. Patient has home BP cuff, will take BP at home every 2-3 days for 2 weeks (until 06-26-17) to ensure no significant changes in BP. Pt  will call with any BP changes -or- s/e for further direction. Refill sent to pharmacy as requested.

## 2017-06-19 NOTE — Telephone Encounter (Signed)
New message    Pt c/o medication issue:  1. Name of Medication: valsartan   2. How are you currently taking this medication (dosage and times per day)? 320 mg-pt takes half in the morning and half at 5pm.  3. Are you having a reaction (difficulty breathing--STAT)? No   4. What is your medication issue? Medication has been recalled and pt is calling to find out what to do.

## 2017-06-25 ENCOUNTER — Other Ambulatory Visit: Payer: Self-pay | Admitting: Cardiovascular Disease

## 2017-06-25 ENCOUNTER — Telehealth: Payer: Self-pay | Admitting: Cardiovascular Disease

## 2017-06-25 DIAGNOSIS — Z79899 Other long term (current) drug therapy: Secondary | ICD-10-CM

## 2017-06-25 NOTE — Telephone Encounter (Signed)
Returned the call back to the patient who was unavailable. The instructions have been given to his wife who verblazied her understanding.   1. Please resume chloralidone at 1/2 tablet daily , then increase to full pill if additional BP control needed.   2. Repeat BMET in 1 week to re-assess electrolytes and renal function  3. Schedule f/u with HTN clinic in 1-2 weeks  Lab orders have been placed and a message sent to scheduling to have the patient come into the HTN clinic.

## 2017-06-25 NOTE — Telephone Encounter (Signed)
New message    Pt is calling about his BP.  Pt c/o BP issue: STAT if pt c/o blurred vision, one-sided weakness or slurred speech  1. What are your last 5 BP readings? This morning-145/93, last night-164/75, 2 days ago-118/62  2. Are you having any other symptoms (ex. Dizziness, headache, blurred vision, passed out)? No   3. What is your BP issue? Pt BP is high. Pt was told to call if it goes up. Please call.

## 2017-06-25 NOTE — Telephone Encounter (Signed)
Recommendations:   1. Please resume chloralidone at 1/2 tablet daily , then increase to full pill if additional BP control needed.   2. Repeat BMET in 1 week to re-assess electrolytes and renal function  3. Schedule f/u with HTN clinic in 1-2 weeks

## 2017-06-25 NOTE — Telephone Encounter (Signed)
Returned the call to the patient. He stated that on 8/17 he had been instructed to switch from valsartan 160 mg bid to irbesartan 150 mg bid due to the valsartan recall. Per the patient, [facility] informed him that his valsartan was safe to due to the manufacturer so he has stayed on the valsartan.  Recently at his PCP office he was informed to stop taking the chlorthalidone because this may be what is causing his irregular heart rhythm. He has been off since Monday. His blood pressure has been gradually climbing  8/22 118/62 morning 8/22 164/74 evening 8/23 145/93 morning  He is asymptomatic but did state that his blood pressure monitor is still registering irregular heart rhythm. He just finished his 14 day monitor. He would like to know if he should restart the chlorthalidone. Will route to pharm D and Dr. Gwenlyn Found for their recommendation.

## 2017-07-07 ENCOUNTER — Telehealth: Payer: Self-pay | Admitting: Cardiovascular Disease

## 2017-07-07 NOTE — Telephone Encounter (Signed)
Discussed at length w patient. Reviewed 14 day event monitor finding of SR/SB w PACs. All questions addressed to patient's satisfaction - he verbalized understanding and thanks. Aware to call again if new concerns.

## 2017-07-07 NOTE — Telephone Encounter (Signed)
New Message     Pt requesting someone call him with the  Monitor results

## 2017-07-09 ENCOUNTER — Ambulatory Visit (INDEPENDENT_AMBULATORY_CARE_PROVIDER_SITE_OTHER): Payer: Medicare Other | Admitting: Pharmacist

## 2017-07-09 VITALS — BP 150/68 | HR 48

## 2017-07-09 DIAGNOSIS — I10 Essential (primary) hypertension: Secondary | ICD-10-CM

## 2017-07-09 LAB — BASIC METABOLIC PANEL
BUN/Creatinine Ratio: 16 (ref 10–24)
BUN: 23 mg/dL (ref 8–27)
CO2: 25 mmol/L (ref 20–29)
Calcium: 9.6 mg/dL (ref 8.6–10.2)
Chloride: 94 mmol/L — ABNORMAL LOW (ref 96–106)
Creatinine, Ser: 1.43 mg/dL — ABNORMAL HIGH (ref 0.76–1.27)
GFR, EST AFRICAN AMERICAN: 52 mL/min/{1.73_m2} — AB (ref >59)
GFR, EST NON AFRICAN AMERICAN: 45 mL/min/{1.73_m2} — AB (ref >59)
Glucose: 79 mg/dL (ref 65–99)
POTASSIUM: 5.1 mmol/L (ref 3.5–5.2)
SODIUM: 132 mmol/L — AB (ref 134–144)

## 2017-07-09 NOTE — Progress Notes (Signed)
Patient ID: Edward [name]                 DOB: 07/06/1930                      MRN: [id number]     HPI: Edward [name] is a 83 y.o. male referred by Dr. Gwenlyn Found to HTN clinic. PMH includes, bilateral renal artery stenosis s/p stenting in 2007, hypertension, CAD and anxiety .He has had hypertension for long time, and has continues to note a pattern of normal morning readings with elevated evening pressures. During last office visit with Dr Gwenlyn Found patient was placed on 14 days monitor. Sinus rhythm with PACs note but no medication changes were recommended. His PCP held chlorthalidone 25mg  for 1 week due to decrease in sodium and potassium. Chlorthalidone resumed at 1/2 tablet (12.5) daily on 06/25/2017 to improve BP control and repeat BMET ordered for today 07/09/2017.  Patient presents to HTN clinic for follow up. Denies shortness of breath, headaches, swelling ot dizziness. Reports increase fatigue after short walk or when singing at church choir.  Current HTN meds:  Valsartan 160 mg twice daily (morning and evening) Metoprolol tart 50 mg twice daily  Clonidine 0.2 mg at 5 pm daily  Chlorthalidone 25mg  daily (okay to try 1/2 table for 1 week)??  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  Home BP readings: 31 readings; average 137/67 (pulse 48-59bpm)  Wt Readings from Last 3 Encounters:  05/27/17 170 lb (77.1 kg)  01/02/17 174 lb 12.8 oz (79.3 kg)  10/17/16 174 lb (78.9 kg)   BP Readings from Last 3 Encounters:  07/09/17 (!) 150/68  05/27/17 (!) 146/80  05/25/17 (!)  146/54   Pulse Readings from Last 3 Encounters:  07/09/17 (!) 48  05/27/17 (!) 58  05/25/17 (!) 50    Past Medical History:  Diagnosis Date  . Atherosclerosis of abdominal aorta (HCC)    CT/abd and pelvis  . Carotid artery occlusion    bilateral carotid bruit, right greater then left -bilateral 40-50% stenosis, 12/26/09- no change  . Chronic anxiety   . Coronary artery disease    s/p BM stent, prox and mid RCA, 2001, cutting ballon RCA stenosis, 2002  . Dysphagia    from esophageal dysmotility-tx with careful eating.   Marland Kitchen History of echocardiogram    2/11 echo EF 70%, mild MR, mildly elevated pulmonary pressures 42 mmHg  . History of renal angiogram    9/10, showed patent renal stents, 30-40% instent restenosis ws the most severe lesion  . Hyperlipidemia   . Left kidney mass    lower pole, observing by urology- Dr. Reece Agar  . Peripheral neuropathy    in both feet from nerve compression   . Renovascular hypertension    s./p. bilateral RA stent implant  . Subclavian artery stenosis [facility])     Current Outpatient Prescriptions on File Prior to Visit  Medication Sig Dispense Refill  . aspirin 325 MG tablet Take 325 mg by mouth daily.    . Calcium Carbonate-Vitamin D (CALCIUM PLUS VITAMIN  D PO) Take 1 tablet by mouth daily.     . chlorthalidone (HYGROTON) 25 MG tablet Take 1 tablet (25 mg total) by mouth daily. 90 tablet 2  . cloNIDine (CATAPRES) 0.1 MG tablet TAKE 2 TABLETS BY MOUTH EVERY DAY 180 tablet 1  . Coenzyme Q10 (CO Q-10) 100 MG CAPS Take 100 mg by mouth daily.    . finasteride (PROSCAR) 5 MG tablet Take 1 tablet by mouth daily.    . folic acid (FOLVITE) 465 MCG tablet Take 400 mcg by mouth daily.    Marland Kitchen LORazepam (ATIVAN) 1 MG tablet Take 1 mg by mouth 2 (two) times daily.     . metoprolol (LOPRESSOR) 50 MG tablet TAKE 1 TABLET (50 MG TOTAL) BY MOUTH 2 (TWO) TIMES DAILY. 180 tablet 3  . Omega-3 Fatty Acids (FISH OIL) 1000 MG CAPS Take 1,000 mg by mouth daily.     Marland Kitchen  omeprazole (PRILOSEC) 20 MG capsule Take 20 mg by mouth daily.    . tamsulosin (FLOMAX) 0.4 MG CAPS capsule Take 1 capsule by mouth daily.    . valsartan (DIOVAN) 320 MG tablet Take 0.5 tablets (160 mg total) by mouth 2 (two) times daily. 30 tablet 11  . atorvastatin (LIPITOR) 40 MG tablet Take 40 mg by mouth daily.    Marland Kitchen triamcinolone cream (KENALOG) 0.1 % Apply 1 application topically as needed.      No current facility-administered medications on file prior to visit.     Allergies  Allergen Reactions  . Amlodipine Other (See Comments)    Tolerates 2.5 mg, higher doses cause LEE with blistering  . Erythromycin   . Penicillins   . Prednisone     Blood pressure (!) 150/68, pulse (!) 48.  Essential hypertension, benign Blood pressure slightly above goal today during office visit but home BP average remains appropriate at 137/67. Patient not denies ADR or problems. Only complain id increase fatigue after walking or singing in choir.   Will continue current therapy without changes, repeat BMET today and re-assess therapy once blood work resulted. If electrolytes are below normal, plan to d/c chlorthalidone therapy. IF renal function and electrolytes remains stable, plan to decrease metoprolol to 50mg  every morning and 25mg  every evening to re-assess fatigue/HR response.   Zariah Cavendish Rodriguez-Guzman PharmD, BCPS, [place] [id number] 07/09/2017 2:11 PM

## 2017-07-09 NOTE — Assessment & Plan Note (Signed)
Blood pressure slightly above goal today during office visit but home BP average remains appropriate at 137/67. Patient not denies ADR or problems. Only complain id increase fatigue after walking or singing in choir.   Will continue current therapy without changes, repeat BMET today and re-assess therapy once blood work resulted. If electrolytes are below normal, plan to d/c chlorthalidone therapy. IF renal function and electrolytes remains stable, plan to decrease metoprolol to 50mg  every morning and 25mg  every evening to re-assess fatigue/HR response.

## 2017-07-09 NOTE — Patient Instructions (Signed)
Return for a  follow up appointment as needed  Your blood pressure today is  150/68 pulse 48  Check your blood pressure at home daily (if able) and keep record of the readings.  Take your BP meds as follows: *ALL medication as prescribed*  Repeat blood work today  Follow up with clinic by phone within 3 days   Bring all of your meds, your BP cuff and your record of home blood pressures to your next appointment.  Exercise as you're able, try to walk approximately 30 minutes per day.  Keep salt intake to a minimum, especially watch canned and prepared boxed foods.  Eat more fresh fruits and vegetables and fewer canned items.  Avoid eating in fast food restaurants.    HOW TO TAKE YOUR BLOOD PRESSURE: . Rest 5 minutes before taking your blood pressure. .  Don't smoke or drink caffeinated beverages for at least 30 minutes before. . Take your blood pressure before (not after) you eat. . Sit comfortably with your back supported and both feet on the floor (don't cross your legs). . Elevate your arm to heart level on a table or a desk. . Use the proper sized cuff. It should fit smoothly and snugly around your bare upper arm. There should be enough room to slip a fingertip under the cuff. The bottom edge of the cuff should be 1 inch above the crease of the elbow. . Ideally, take 3 measurements at one sitting and record the average.

## 2017-09-30 ENCOUNTER — Ambulatory Visit (HOSPITAL_COMMUNITY)
Admission: RE | Admit: 2017-09-30 | Discharge: 2017-09-30 | Disposition: A | Discharge status: Home / Self Care | Payer: Medicare Other | Source: Ambulatory Visit | Attending: Cardiology | Admitting: Cardiology

## 2017-09-30 DIAGNOSIS — I10 Essential (primary) hypertension: Secondary | ICD-10-CM | POA: Diagnosis not present

## 2017-09-30 DIAGNOSIS — I6523 Occlusion and stenosis of bilateral carotid arteries: Secondary | ICD-10-CM | POA: Insufficient documentation

## 2017-09-30 DIAGNOSIS — I6529 Occlusion and stenosis of unspecified carotid artery: Secondary | ICD-10-CM

## 2017-09-30 DIAGNOSIS — I2510 Atherosclerotic heart disease of native coronary artery without angina pectoris: Secondary | ICD-10-CM | POA: Insufficient documentation

## 2017-09-30 DIAGNOSIS — E785 Hyperlipidemia, unspecified: Secondary | ICD-10-CM | POA: Diagnosis not present

## 2017-10-02 ENCOUNTER — Other Ambulatory Visit: Payer: Self-pay | Admitting: Cardiovascular Disease

## 2017-10-02 DIAGNOSIS — I6529 Occlusion and stenosis of unspecified carotid artery: Secondary | ICD-10-CM

## 2017-10-20 ENCOUNTER — Ambulatory Visit
Admission: RE | Admit: 2017-10-20 | Discharge: 2017-10-20 | Disposition: A | Discharge status: Home / Self Care | Payer: Medicare Other | Source: Ambulatory Visit | Attending: Family Medicine | Admitting: Family Medicine

## 2017-10-20 ENCOUNTER — Other Ambulatory Visit: Payer: Self-pay | Admitting: Family Medicine

## 2017-10-20 ENCOUNTER — Ambulatory Visit: Payer: Medicare Other

## 2017-10-20 DIAGNOSIS — N62 Hypertrophy of breast: Secondary | ICD-10-CM

## 2017-11-02 ENCOUNTER — Other Ambulatory Visit: Payer: Self-pay | Admitting: *Deleted

## 2017-11-02 MED ORDER — CHLORTHALIDONE 25 MG PO TABS: 25.0000 mg | ORAL_TABLET | Freq: Every day | ORAL | 2 refills | Status: DC

## 2017-12-16 ENCOUNTER — Other Ambulatory Visit: Payer: Self-pay | Admitting: Cardiovascular Disease

## 2017-12-17 ENCOUNTER — Other Ambulatory Visit: Payer: Self-pay | Admitting: Cardiovascular Disease

## 2017-12-17 NOTE — Telephone Encounter (Signed)
REFILL 

## 2018-02-15 ENCOUNTER — Other Ambulatory Visit: Payer: Self-pay | Admitting: Cardiovascular Disease

## 2018-02-15 NOTE — Telephone Encounter (Signed)
°*  STAT* If patient is at the pharmacy, call can be transferred to refill team.   1. Which medications need to be refilled? (please list name of each medication and dose if known) Metoprolol- please call today if possible  2. which pharmacy/location (including street and city if local pharmacy) is [phone]  3. Do they need a 30 day or 90 day supply? 180 and refills

## 2018-02-16 ENCOUNTER — Other Ambulatory Visit: Payer: Self-pay

## 2018-02-16 MED ORDER — METOPROLOL TARTRATE 50 MG PO TABS: ORAL_TABLET | ORAL | 0 refills | Status: DC

## 2018-02-16 MED ORDER — METOPROLOL TARTRATE 50 MG PO TABS: ORAL_TABLET | ORAL | 2 refills | Status: DC

## 2018-02-22 ENCOUNTER — Other Ambulatory Visit: Payer: Self-pay | Admitting: Cardiovascular Disease

## 2018-02-22 NOTE — Telephone Encounter (Signed)
Rx request sent to pharmacy.  

## 2018-05-19 ENCOUNTER — Other Ambulatory Visit: Payer: Self-pay | Admitting: Cardiovascular Disease

## 2018-06-01 ENCOUNTER — Ambulatory Visit: Payer: Medicare Other | Admitting: Cardiovascular Disease

## 2018-06-01 ENCOUNTER — Encounter: Payer: Self-pay | Admitting: Cardiovascular Disease

## 2018-06-01 VITALS — BP 136/60 | HR 53 | Ht 71.0 in | Wt 170.0 lb

## 2018-06-01 DIAGNOSIS — I701 Atherosclerosis of renal artery: Secondary | ICD-10-CM

## 2018-06-01 DIAGNOSIS — I6522 Occlusion and stenosis of left carotid artery: Secondary | ICD-10-CM

## 2018-06-01 DIAGNOSIS — I10 Essential (primary) hypertension: Secondary | ICD-10-CM | POA: Diagnosis not present

## 2018-06-01 DIAGNOSIS — E782 Mixed hyperlipidemia: Secondary | ICD-10-CM | POA: Diagnosis not present

## 2018-06-01 MED ORDER — ASPIRIN EC 81 MG PO TBEC: 81.0000 mg | DELAYED_RELEASE_TABLET | Freq: Every day | ORAL | 3 refills | Status: AC

## 2018-06-01 NOTE — Assessment & Plan Note (Signed)
History of bilateral renal artery stenting by Dr. Melida Gimenez 06/18/2016 with normal renal Dopplers over the years most recently 11/14/2016.  This will not need to be rechecked.

## 2018-06-01 NOTE — Progress Notes (Signed)
06/01/2018 Edward [name]   November 07, 1929  [id number]  Primary Physician Mayra Neer, MD Primary Cardiologist: Lorretta Harp MD FACP, [place], [place], [place]  HPI:  Edward [name] is a 84 y.o.   married Caucasian male father of 2 children, grandfather of 5 grandchildren who worked at [employer] in the past. I last saw him in the office 11/27/2016.  His wife is also a patient of mine.  His primary care physician is Dr. Serita Grammes. He was referred by Dr. Casandra Doffing for peripheral vascular evaluation but has expressed a desire to follow up with me for his cardiology care as well in the future. He has a history of treated hypertension and hyperlipidemia. He has never smoked. Her father did have A. Myocardial infarction in his 38s. He had an myocardial function in October of 2001 and had 2 NIR bare metal stents placed in his RCA by Dr. Quay Burow 08/20/00. He has had renal artery stenting bilaterally performed by Dr. Rolm Baptise 06/18/06. He had carotid Dopplers performed recently that showed moderate bilateral internal carotid artery stenosis with incidentally noted bilateral subclavian artery disease. His vertebral antegrade on the left and apparent on the right. There was a 40-50 mm pressure differential with the right arm being less than the left.  He does measure his blood pressure twice a day and has noticed evening hypertension.  Since I saw him a year ago he has lost hearing in his left ear on 05/08/2018 for unclear reasons.  Otherwise, he denies chest pain or shortness of breath.     Current Meds  Medication Sig  . atorvastatin (LIPITOR) 40 MG tablet Take 40 mg by mouth daily.  . Calcium Carbonate-Vitamin D (CALCIUM PLUS VITAMIN D PO) Take 1 tablet by mouth daily.   . cloNIDine (CATAPRES) 0.1 MG tablet TAKE 2 TABLETS BY MOUTH EVERY DAY  . Coenzyme Q10 (CO Q-10) 100 MG CAPS Take 100 mg by mouth daily.  . finasteride (PROSCAR) 5 MG tablet Take 1 tablet by mouth daily.  . folic acid  (FOLVITE) 914 MCG tablet Take 400 mcg by mouth daily.  Marland Kitchen LORazepam (ATIVAN) 1 MG tablet Take 1 mg by mouth 2 (two) times daily.   . metoprolol tartrate (LOPRESSOR) 50 MG tablet TAKE 1 TABLET (50 MG TOTAL) BY MOUTH 2 (TWO) TIMES DAILY.  . Multiple Vitamins-Minerals (ICAPS AREDS FORMULA PO) Take by mouth 2 (two) times daily.  . Omega-3 Fatty Acids (FISH OIL) 1000 MG CAPS Take 1,000 mg by mouth daily.   Marland Kitchen omeprazole (PRILOSEC) 20 MG capsule Take 20 mg by mouth daily.  Vladimir Faster Glycol-Propyl Glycol (SYSTANE) 0.4-0.3 % SOLN Apply to eye at bedtime.  . tamsulosin (FLOMAX) 0.4 MG CAPS capsule Take 1 capsule by mouth daily.  Marland Kitchen triamcinolone cream (KENALOG) 0.1 % Apply 1 application topically as needed.   . valsartan (DIOVAN) 320 MG tablet Take 0.5 tablets (160 mg total) by mouth 2 (two) times daily. KEEP OV.  . [DISCONTINUED] aspirin 325 MG tablet Take 325 mg by mouth daily.     Allergies  Allergen Reactions  . Amlodipine Other (See Comments)    Tolerates 2.5 mg, higher doses cause LEE with blistering  . Erythromycin   . Penicillins   . Prednisone     Social History   Socioeconomic History  . Marital status: Married    Spouse name: Not on file  . Number of children: Not on file  . Years of education: Not on file  .  Highest education level: Not on file  Occupational History  . Not on file  Social Needs  . Financial resource strain: Not on file  . Food insecurity:    Worry: Not on file    Inability: Not on file  . Transportation needs:    Medical: Not on file    Non-medical: Not on file  Tobacco Use  . Smoking status: Never Smoker  . Smokeless tobacco: Never Used  Substance and Sexual Activity  . Alcohol use: No  . Drug use: No  . Sexual activity: Not on file  Lifestyle  . Physical activity:    Days per week: Not on file    Minutes per session: Not on file  . Stress: Not on file  Relationships  . Social connections:    Talks on phone: Not on file    Gets together: Not  on file    Attends religious service: Not on file    Active member of club or organization: Not on file    Attends meetings of clubs or organizations: Not on file    Relationship status: Not on file  . Intimate partner violence:    Fear of current or ex partner: Not on file    Emotionally abused: Not on file    Physically abused: Not on file    Forced sexual activity: Not on file  Other Topics Concern  . Not on file  Social History Narrative  . Not on file     Review of Systems: General: negative for chills, fever, night sweats or weight changes.  Cardiovascular: negative for chest pain, dyspnea on exertion, edema, orthopnea, palpitations, paroxysmal nocturnal dyspnea or shortness of breath Dermatological: negative for rash Respiratory: negative for cough or wheezing Urologic: negative for hematuria Abdominal: negative for nausea, vomiting, diarrhea, bright red blood per rectum, melena, or hematemesis Neurologic: negative for visual changes, syncope, or dizziness All other systems reviewed and are otherwise negative except as noted above.    Blood pressure 136/60, pulse (!) 53, height 5\' 11"  (1.803 m), weight 170 lb (77.1 kg).  General appearance: alert and no distress Neck: no adenopathy, no JVD, supple, symmetrical, trachea midline, thyroid not enlarged, symmetric, no tenderness/mass/nodules and Soft bilateral carotid bruits Lungs: clear to auscultation bilaterally Heart: regular rate and rhythm, S1, S2 normal, no murmur, click, rub or gallop Extremities: extremities normal, atraumatic, no cyanosis or edema Pulses: 2+ and symmetric Skin: Skin color, texture, turgor normal. No rashes or lesions Neurologic: Alert and oriented X 3, normal strength and tone. Normal symmetric reflexes. Normal coordination and gait  EKG sinus bradycardia 53 with right bundle branch block.  I personally reviewed this EKG.  ASSESSMENT AND PLAN:   Coronary atherosclerosis of native coronary  artery History of CAD status post myocardial infarction 12/1999 with implantation of 2 NIR bare-metal stents in the RCA by Dr. Ilda Foil he denies chest pain or shortness of breath.  Mixed hyperlipidemia History of hyperlipidemia on statin therapy.  We will recheck a lipid and liver profile.  Carotid artery stenosis History of moderate left ICA stenosis by duplex ultrasound last checked 09/30/2017.  We will recheck this this coming November.  Essential hypertension, benign History of essential hypertension with blood pressure measured at 136/60.  He is on metoprolol and valsartan as well as chlorthalidone.  Continue current meds at current dosing.  Renal artery stenosis History of bilateral renal artery stenting by Dr. Melida Gimenez 06/18/2016 with normal renal Dopplers over the years most recently 11/14/2016.  This will  not need to be rechecked.      Lorretta Harp MD FACP,FACC,FAHA, [facility] 06/01/2018 10:22 AM

## 2018-06-01 NOTE — Assessment & Plan Note (Signed)
History of hyperlipidemia on statin therapy. We will recheck a lipid and liver profile 

## 2018-06-01 NOTE — Assessment & Plan Note (Addendum)
History of moderate left ICA stenosis by duplex ultrasound last checked 09/30/2017.  We will recheck this this coming November.

## 2018-06-01 NOTE — Assessment & Plan Note (Signed)
History of essential hypertension with blood pressure measured at 136/60.  He is on metoprolol and valsartan as well as chlorthalidone.  Continue current meds at current dosing.

## 2018-06-01 NOTE — Patient Instructions (Signed)
Medication Instructions:  Your physician has recommended you make the following change in your medication:  1) DECREASE Aspirin to 81 mg tablet by mouth ONCE daily   Labwork: Your physician recommends that you return for lab work in: FASTING   Testing/Procedures: Your physician has requested that you have a carotid duplex. This test is an ultrasound of the carotid arteries in your neck. It looks at blood flow through these arteries that supply the brain with blood. Allow one hour for this exam. There are no restrictions or special instructions. SCHEDULE AFTER 09/30/2018    Follow-Up: Your physician wants you to follow-up in: 12 months with Dr. Gwenlyn Found. You will receive a reminder letter in the mail two months in advance. If you don't receive a letter, please call our office to schedule the follow-up appointment.   Any Other Special Instructions Will Be Listed Below (If Applicable).     If you need a refill on your cardiac medications before your next appointment, please call your pharmacy.

## 2018-06-01 NOTE — Assessment & Plan Note (Signed)
History of CAD status post myocardial infarction 12/1999 with implantation of 2 NIR bare-metal stents in the RCA by Dr. Ilda Foil he denies chest pain or shortness of breath.

## 2018-06-18 ENCOUNTER — Other Ambulatory Visit: Payer: Self-pay | Admitting: Cardiovascular Disease

## 2018-07-08 ENCOUNTER — Encounter: Payer: Self-pay | Admitting: *Deleted

## 2018-07-12 ENCOUNTER — Telehealth: Payer: Self-pay | Admitting: Cardiovascular Disease

## 2018-07-12 NOTE — Telephone Encounter (Signed)
Spoke with patient of Dr. Gwenlyn Found who was in to see PCP on 9/5. He had an EKG done and his PCP was concerned about the HR and length of pause between beats. He states his home BP cuff indicates he has an irregular rhythm. He gets tired with any amount of exertion. His PCP told him to go back on ASA 325mg  instead of ASA 81mg  which he did. He states his PCP was supposed to send over EKG for MD to review and he called about this on Friday 9/6 but was told it had not yet been received at our office.   Called [facility] to obtain faxed copy of EKG for either scanning to Dr. Gwenlyn Found in-basket or review by DOD.  Will wait on fax

## 2018-07-12 NOTE — Telephone Encounter (Signed)
New Message:     Patient is calling to verify if Dr. Gwenlyn Found received his results from his EKG.  Patient states his PCP stated the readings were irregular and would the results to be looked over

## 2018-07-13 NOTE — Telephone Encounter (Signed)
Follow Up:    Pt calling to find out if his EKG had been received and reviewed?

## 2018-07-13 NOTE — Telephone Encounter (Signed)
Called patient wife, and advised that we had not seen the EKG yet that I was aware but had already called [facility] and they are faxing it to my attention and will give to Dr.Berry to review. Sending a message to covering Nurse so she is aware.  Thanks!

## 2018-07-14 ENCOUNTER — Telehealth: Payer: Self-pay | Admitting: Cardiovascular Disease

## 2018-07-14 NOTE — Telephone Encounter (Signed)
New Message            Patient called today to let us know that he has done lab work and had it faxed over to [place].

## 2018-07-14 NOTE — Telephone Encounter (Signed)
Routed to RN to make aware of pending fax and labs for Dr. Gwenlyn Found to review

## 2018-07-21 ENCOUNTER — Telehealth: Payer: Self-pay | Admitting: Cardiovascular Disease

## 2018-07-21 NOTE — Telephone Encounter (Signed)
Patient calling to follow up on lab work and an ekg that was faxed from [facility] ( Dr. Brigitte Pulse). I have contacted their office and awaiting fax, will bring to Dr. Kennon Holter attention once received.

## 2018-07-21 NOTE — Telephone Encounter (Signed)
Received fax with patient's labs, and ekg. This has been placed in Dr. Kennon Holter box at the [facility] office. Patient informed that we received records an we will call if any changes in care need to be made. Will route this to Dr. Gwenlyn Found along with Lynnda Child, RN.

## 2018-07-21 NOTE — Telephone Encounter (Signed)
New Message   Patient is calling in reference to labs and ekg results that he had his PCP to send. Please call to discuss.

## 2018-10-05 ENCOUNTER — Other Ambulatory Visit: Payer: Self-pay | Admitting: *Deleted

## 2018-10-05 ENCOUNTER — Ambulatory Visit (HOSPITAL_COMMUNITY)
Admission: RE | Admit: 2018-10-05 | Discharge: 2018-10-05 | Disposition: A | Discharge status: Home / Self Care | Payer: Medicare Other | Source: Ambulatory Visit | Attending: Cardiovascular Disease | Admitting: Cardiovascular Disease

## 2018-10-05 DIAGNOSIS — I6529 Occlusion and stenosis of unspecified carotid artery: Secondary | ICD-10-CM

## 2018-10-05 NOTE — Progress Notes (Signed)
vas 

## 2018-11-01 ENCOUNTER — Other Ambulatory Visit: Payer: Self-pay | Admitting: Cardiovascular Disease

## 2019-02-04 ENCOUNTER — Other Ambulatory Visit: Payer: Self-pay | Admitting: Cardiovascular Disease

## 2019-02-08 ENCOUNTER — Other Ambulatory Visit: Payer: Self-pay | Admitting: Cardiovascular Disease

## 2019-04-27 ENCOUNTER — Other Ambulatory Visit: Payer: Self-pay | Admitting: Cardiovascular Disease

## 2019-05-04 ENCOUNTER — Other Ambulatory Visit: Payer: Self-pay | Admitting: Cardiovascular Disease

## 2019-06-16 ENCOUNTER — Other Ambulatory Visit: Payer: Self-pay | Admitting: Cardiovascular Disease

## 2019-06-21 ENCOUNTER — Telehealth: Payer: Self-pay | Admitting: Cardiovascular Disease

## 2019-06-21 NOTE — Telephone Encounter (Signed)
90 day supply sent to [facility] [address] on 06/16/2019.

## 2019-06-21 NOTE — Telephone Encounter (Signed)
New Message       *STAT* If patient is at the pharmacy, call can be transferred to refill team.   1. Which medications need to be refilled? (please list name of each medication and dose if known) Clonidine   2. Which pharmacy/location (including street and city if local pharmacy) is medication to be sent to? [facility] [address]   3. Do they need a 30 day or 90 day supply? 90 day

## 2019-06-29 ENCOUNTER — Other Ambulatory Visit: Payer: Self-pay

## 2019-06-29 ENCOUNTER — Encounter (HOSPITAL_COMMUNITY): Payer: Self-pay

## 2019-06-29 ENCOUNTER — Emergency Department (HOSPITAL_COMMUNITY)
Admission: EM | Admit: 2019-06-29 | Discharge: 2019-06-29 | Disposition: A | Discharge status: Home / Self Care | Payer: Medicare Other | Attending: Emergency Medicine | Admitting: Emergency Medicine

## 2019-06-29 DIAGNOSIS — I10 Essential (primary) hypertension: Secondary | ICD-10-CM | POA: Diagnosis not present

## 2019-06-29 DIAGNOSIS — N3001 Acute cystitis with hematuria: Secondary | ICD-10-CM | POA: Insufficient documentation

## 2019-06-29 DIAGNOSIS — Z79899 Other long term (current) drug therapy: Secondary | ICD-10-CM | POA: Diagnosis not present

## 2019-06-29 DIAGNOSIS — E860 Dehydration: Secondary | ICD-10-CM | POA: Insufficient documentation

## 2019-06-29 DIAGNOSIS — R509 Fever, unspecified: Secondary | ICD-10-CM | POA: Diagnosis present

## 2019-06-29 DIAGNOSIS — I2510 Atherosclerotic heart disease of native coronary artery without angina pectoris: Secondary | ICD-10-CM | POA: Insufficient documentation

## 2019-06-29 DIAGNOSIS — Z7982 Long term (current) use of aspirin: Secondary | ICD-10-CM | POA: Diagnosis not present

## 2019-06-29 LAB — URINALYSIS, ROUTINE W REFLEX MICROSCOPIC
Bilirubin Urine: NEGATIVE
Glucose, UA: NEGATIVE mg/dL
Ketones, ur: NEGATIVE mg/dL
Nitrite: NEGATIVE
Protein, ur: 30 mg/dL — AB
RBC / HPF: >50 RBC/hpf — ABNORMAL HIGH (ref 0–5)
Specific Gravity, Urine: 1.016 (ref 1.005–1.030)
WBC, UA: >50 WBC/hpf — ABNORMAL HIGH (ref 0–5)
pH: 5 (ref 5.0–8.0)

## 2019-06-29 LAB — COMPREHENSIVE METABOLIC PANEL
ALT: 17 U/L (ref 0–44)
AST: 24 U/L (ref 15–41)
Albumin: 3.3 g/dL — ABNORMAL LOW (ref 3.5–5.0)
Alkaline Phosphatase: 74 U/L (ref 38–126)
Anion gap: 9 (ref 5–15)
BUN: 39 mg/dL — ABNORMAL HIGH (ref 8–23)
CO2: 26 mmol/L (ref 22–32)
Calcium: 9.1 mg/dL (ref 8.9–10.3)
Chloride: 97 mmol/L — ABNORMAL LOW (ref 98–111)
Creatinine, Ser: 1.91 mg/dL — ABNORMAL HIGH (ref 0.61–1.24)
GFR calc Af Amer: 36 mL/min — ABNORMAL LOW (ref >60)
GFR calc non Af Amer: 31 mL/min — ABNORMAL LOW (ref >60)
Glucose, Bld: 112 mg/dL — ABNORMAL HIGH (ref 70–99)
Potassium: 4.4 mmol/L (ref 3.5–5.1)
Sodium: 132 mmol/L — ABNORMAL LOW (ref 135–145)
Total Bilirubin: 0.8 mg/dL (ref 0.3–1.2)
Total Protein: 6.8 g/dL (ref 6.5–8.1)

## 2019-06-29 LAB — CBC WITH DIFFERENTIAL/PLATELET
Abs Immature Granulocytes: 0.09 10*3/uL — ABNORMAL HIGH (ref 0.00–0.07)
Basophils Absolute: 0 10*3/uL (ref 0.0–0.1)
Basophils Relative: 0 %
Eosinophils Absolute: 0 10*3/uL (ref 0.0–0.5)
Eosinophils Relative: 0 %
HCT: 30.7 % — ABNORMAL LOW (ref 39.0–52.0)
Hemoglobin: 10.4 g/dL — ABNORMAL LOW (ref 13.0–17.0)
Immature Granulocytes: 1 %
Lymphocytes Relative: 6 %
Lymphs Abs: 1.1 10*3/uL (ref 0.7–4.0)
MCH: 31.8 pg (ref 26.0–34.0)
MCHC: 33.9 g/dL (ref 30.0–36.0)
MCV: 93.9 fL (ref 80.0–100.0)
Monocytes Absolute: 1.6 10*3/uL — ABNORMAL HIGH (ref 0.1–1.0)
Monocytes Relative: 8 %
Neutro Abs: 16.9 10*3/uL — ABNORMAL HIGH (ref 1.7–7.7)
Neutrophils Relative %: 85 %
Platelets: 155 10*3/uL (ref 150–400)
RBC: 3.27 MIL/uL — ABNORMAL LOW (ref 4.22–5.81)
RDW: 12.7 % (ref 11.5–15.5)
WBC: 19.7 10*3/uL — ABNORMAL HIGH (ref 4.0–10.5)
nRBC: 0 % (ref 0.0–0.2)

## 2019-06-29 LAB — LACTIC ACID, PLASMA: Lactic Acid, Venous: 1.4 mmol/L (ref 0.5–1.9)

## 2019-06-29 MED ORDER — SODIUM CHLORIDE 0.9 % IV BOLUS
1000.0000 mL | Freq: Once | INTRAVENOUS | Status: AC
  Administered 2019-06-29: 1000 mL via INTRAVENOUS

## 2019-06-29 MED ORDER — ACETAMINOPHEN 500 MG PO TABS
1000.0000 mg | ORAL_TABLET | Freq: Once | ORAL | Status: AC
  Administered 2019-06-29: 1000 mg via ORAL
  Filled 2019-06-29: qty 2

## 2019-06-29 MED ORDER — SODIUM CHLORIDE 0.9 % IV SOLN
1.0000 g | Freq: Once | INTRAVENOUS | Status: AC
  Administered 2019-06-29: 1 g via INTRAVENOUS
  Filled 2019-06-29: qty 10

## 2019-06-29 MED ORDER — CIPROFLOXACIN HCL 500 MG PO TABS: 500.0000 mg | ORAL_TABLET | Freq: Two times a day (BID) | ORAL | 0 refills | Status: DC

## 2019-06-29 NOTE — ED Triage Notes (Addendum)
Pt arrived via [facility] from home   C/O  Fever and urinary difficulty. Per EMS started yesterday around lunch accompanied by frequent urination. Urinate without pain but cant empty bladder. Pt called his PCP who referred him to be seen at the ED. Pt has been taking "Bufferin" for the fever without relief

## 2019-06-29 NOTE — ED Notes (Signed)
Pt verbalizes understanding of d/c instructions. Belonging returned. Pt is ambulatory our of the ED

## 2019-06-29 NOTE — ED Provider Notes (Signed)
[facility] DEPT Provider Note   CSN: [id number] Arrival date & time: 06/29/19  1404     History   Chief Complaint Chief Complaint  Patient presents with  . Fever    HPI Edward [name] is a 85 y.o. male.     Pt presents to the ED today with fever and urinary sx.  The pt said sx started yesterday.  He feels like he needs to urinate frequently, but then does not empty his bladder and has to urinate again.  The pt has been taking ASA for his fever.  No sob.  No cough.  No known covid exposures.  He lives at home.     Past Medical History:  Diagnosis Date  . Atherosclerosis of abdominal aorta (HCC)    CT/abd and pelvis  . Carotid artery occlusion    bilateral carotid bruit, right greater then left -bilateral 40-50% stenosis, 12/26/09- no change  . Chronic anxiety   . Coronary artery disease    s/p BM stent, prox and mid RCA, 2001, cutting ballon RCA stenosis, 2002  . Dysphagia    from esophageal dysmotility-tx with careful eating.   Marland Kitchen History of echocardiogram    2/11 echo EF 70%, mild MR, mildly elevated pulmonary pressures 42 mmHg  . History of renal angiogram    9/10, showed patent renal stents, 30-40% instent restenosis ws the most severe lesion  . Hyperlipidemia   . Left kidney mass    lower pole, observing by urology- Dr. Reece Agar  . Peripheral neuropathy    in both feet from nerve compression   . Renovascular hypertension    s./p. bilateral RA stent implant  . Subclavian artery stenosis [facility])     Patient Active Problem List   Diagnosis Date Noted  . Palpitations 05/27/2017  . Renal artery stenosis ([place]) 03/10/2014  . Coronary atherosclerosis of native coronary artery 01/11/2014  . Atherosclerosis of native arteries of the extremities with ulceration(440.23) 01/11/2014  . Impotence of organic origin 01/11/2014  . Unspecified hereditary and idiopathic peripheral neuropathy 01/11/2014  . Mixed hyperlipidemia 01/11/2014  . Sleep  disturbance, unspecified 01/11/2014  . Carotid artery stenosis 01/11/2014  . Anxiety state, unspecified 01/11/2014  . Allergic rhinitis, cause unspecified 01/11/2014  . Essential hypertension, benign 01/11/2014  . Edema 01/11/2014  . Pain in joint, ankle and foot 09/08/2013  . Metatarsalgia of both feet 09/08/2013  . Bunionette 09/08/2013    Past Surgical History:  Procedure Laterality Date  . CARDIAC CATHETERIZATION  2001   BM stent, prox and mid RCA  . cataract surgery Bilateral 04/14  . COLONOSCOPY     every 10 years  . left foot surgery    . RENAL ARTERY STENT  08/07        Home Medications    Prior to Admission medications   Medication Sig Start Date End Date Taking? Authorizing Provider  aspirin EC 81 MG tablet Take 1 tablet (81 mg total) by mouth daily. 06/01/18   Lorretta Harp, MD  atorvastatin (LIPITOR) 40 MG tablet Take 40 mg by mouth daily.    [provider]  Calcium Carbonate-Vitamin D (CALCIUM PLUS VITAMIN D PO) Take 1 tablet by mouth daily.     [provider]  chlorthalidone (HYGROTON) 25 MG tablet Take 1 tablet (25 mg total) by mouth daily. 02/22/18 05/23/18  Lorretta Harp, MD  chlorthalidone (HYGROTON) 25 MG tablet Take 1 tablet (25 mg total) by mouth daily. Keep scheduled Appointment 05/04/19  Lorretta Harp, MD  ciprofloxacin (CIPRO) 500 MG tablet Take 1 tablet (500 mg total) by mouth 2 (two) times daily. 06/29/19   Isla Pence, MD  cloNIDine (CATAPRES) 0.1 MG tablet TAKE 2 TABLETS BY MOUTH EVERY DAY 06/16/19   Lorretta Harp, MD  Coenzyme Q10 (CO Q-10) 100 MG CAPS Take 100 mg by mouth daily.    [provider]  finasteride (PROSCAR) 5 MG tablet Take 1 tablet by mouth daily. 12/16/16   [provider]  folic acid (FOLVITE) A999333 MCG tablet Take 400 mcg by mouth daily.    [provider]  LORazepam (ATIVAN) 1 MG tablet Take 1 mg by mouth 2 (two) times daily.  08/05/13   [provider]  metoprolol  tartrate (LOPRESSOR) 50 MG tablet TAKE 1 TABLET BY MOUTH TWICE A DAY**DUE FOR OFFICE VISIT PER DOCTOR** 02/04/19   Lorretta Harp, MD  Multiple Vitamins-Minerals (ICAPS AREDS FORMULA PO) Take by mouth 2 (two) times daily.    [provider]  Omega-3 Fatty Acids (FISH OIL) 1000 MG CAPS Take 1,000 mg by mouth daily.     [provider]  omeprazole (PRILOSEC) 20 MG capsule Take 20 mg by mouth daily.    [provider]  Polyethyl Glycol-Propyl Glycol (SYSTANE) 0.4-0.3 % SOLN Apply to eye at bedtime.    [provider]  tamsulosin (FLOMAX) 0.4 MG CAPS capsule Take 1 capsule by mouth daily. 07/13/13   [provider]  triamcinolone cream (KENALOG) 0.1 % Apply 1 application topically as needed.  08/01/13   [provider]  valsartan (DIOVAN) 320 MG tablet TAKE 0.5 TABLETS (160 MG TOTAL) BY MOUTH 2 (TWO) TIMES DAILY. 04/27/19   Lorretta Harp, MD    Family History Family History  Problem Relation Age of Onset  . Hypertension Mother   . Hypertension Father     Social History Social History   Tobacco Use  . Smoking status: Never Smoker  . Smokeless tobacco: Never Used  Substance Use Topics  . Alcohol use: No  . Drug use: No     Allergies   Amlodipine, Erythromycin, Penicillins, and Prednisone   Review of Systems Review of Systems  Constitutional: Positive for fever.  Genitourinary: Positive for frequency.  All other systems reviewed and are negative.    Physical Exam Updated Vital Signs BP (!) 162/54   Pulse 75   Temp 98.7 F (37.1 C) (Oral)   Resp (!) 31   Ht 5\' 10"  (1.778 m)   SpO2 96%   BMI 24.39 kg/m   Physical Exam Vitals signs and nursing note reviewed.  Constitutional:      Appearance: Normal appearance.  HENT:     Head: Normocephalic and atraumatic.     Right Ear: External ear normal.     Left Ear: External ear normal.     Nose: Nose normal.     Mouth/Throat:     Mouth: Mucous membranes are moist.      Pharynx: Oropharynx is clear.  Eyes:     Extraocular Movements: Extraocular movements intact.     Conjunctiva/sclera: Conjunctivae normal.     Pupils: Pupils are equal, round, and reactive to light.  Neck:     Musculoskeletal: Normal range of motion and neck supple.  Cardiovascular:     Rate and Rhythm: Normal rate and regular rhythm.     Pulses: Normal pulses.     Heart sounds: Normal heart sounds.  Pulmonary:     Effort: Pulmonary  effort is normal.     Breath sounds: Normal breath sounds.  Abdominal:     General: Abdomen is flat.     Tenderness: There is abdominal tenderness in the suprapubic area.  Musculoskeletal: Normal range of motion.  Skin:    General: Skin is warm.     Capillary Refill: Capillary refill takes less than 2 seconds.  Neurological:     General: No focal deficit present.     Mental Status: He is alert and oriented to person, place, and time.  Psychiatric:        Mood and Affect: Mood normal.        Behavior: Behavior normal.      ED Treatments / Results  Labs (all labs ordered are listed, but only abnormal results are displayed) Labs Reviewed  COMPREHENSIVE METABOLIC PANEL - Abnormal; Notable for the following components:      Result Value   Sodium 132 (*)    Chloride 97 (*)    Glucose, Bld 112 (*)    BUN 39 (*)    Creatinine, Ser 1.91 (*)    Albumin 3.3 (*)    GFR calc non Af Amer 31 (*)    GFR calc Af Amer 36 (*)    All other components within normal limits  CBC WITH DIFFERENTIAL/PLATELET - Abnormal; Notable for the following components:   WBC 19.7 (*)    RBC 3.27 (*)    Hemoglobin 10.4 (*)    HCT 30.7 (*)    Neutro Abs 16.9 (*)    Monocytes Absolute 1.6 (*)    Abs Immature Granulocytes 0.09 (*)    All other components within normal limits  URINALYSIS, ROUTINE W REFLEX MICROSCOPIC - Abnormal; Notable for the following components:   APPearance CLOUDY (*)    Hgb urine dipstick MODERATE (*)    Protein, ur 30 (*)    Leukocytes,Ua LARGE (*)     RBC / HPF >50 (*)    WBC, UA >50 (*)    Bacteria, UA MANY (*)    All other components within normal limits  URINE CULTURE  LACTIC ACID, PLASMA  LACTIC ACID, PLASMA    EKG None  Radiology No results found.  Procedures Procedures (including critical care time)  Medications Ordered in ED Medications  cefTRIAXone (ROCEPHIN) 1 g in sodium chloride 0.9 % 100 mL IVPB (1 g Intravenous New Bag/Given 06/29/19 1605)  sodium chloride 0.9 % bolus 1,000 mL (0 mLs Intravenous Stopped 06/29/19 1610)  acetaminophen (TYLENOL) tablet 1,000 mg (1,000 mg Oral Given 06/29/19 1432)     Initial Impression / Assessment and Plan / ED Course  I have reviewed the triage vital signs and the nursing notes.  Pertinent labs & imaging results that were available during my care of the patient were reviewed by me and considered in my medical decision making (see chart for details).     Pt is feeling much better.  He has some mild AKI, but it given IVFs and is drinking fluids.  No signs of sepsis.  He is given rocephin in ED and d/c home with cipro.  He and his wife know that he needs to return if he worsens.  Final Clinical Impressions(s) / ED Diagnoses   Final diagnoses:  Acute cystitis with hematuria  Fever in adult  Dehydration    ED Discharge Orders         Ordered    ciprofloxacin (CIPRO) 500 MG tablet  2 times daily     06/29/19  [place], Jadira Nierman, MD 06/29/19 [id number]

## 2019-07-01 LAB — URINE CULTURE: Culture: >=100000 — AB

## 2019-07-03 ENCOUNTER — Telehealth: Payer: Self-pay | Admitting: Emergency Medicine

## 2019-07-03 NOTE — Telephone Encounter (Signed)
Post ED Visit - Positive Culture Follow-up  Culture report reviewed by antimicrobial stewardship pharmacist: [facility] Team []  Elenor Quinones, Pharm.D. []  Heide Guile, Pharm.D., BCPS AQ-ID []  Parks Neptune, Pharm.D., BCPS []  Alycia Rossetti, Pharm.D., BCPS []  [place], [place].D., BCPS, AAHIVP []  Legrand Como, Pharm.D., BCPS, AAHIVP []  Salome Arnt, PharmD, BCPS []  Johnnette Gourd, PharmD, BCPS []  Hughes Better, PharmD, BCPS []  Leeroy Cha, PharmD []  Laqueta Linden, PharmD, BCPS []  Albertina Parr, PharmD  [place] Team [x]  Leodis Sias, PharmD []  Lindell Spar, PharmD []  Royetta Asal, PharmD []  Graylin Shiver, Rph []  Rema Fendt) Glennon Mac, PharmD []  Arlyn Dunning, PharmD []  Netta Cedars, PharmD []  Dia Sitter, PharmD []  Leone Haven, PharmD []  Gretta Arab, PharmD []  Theodis Shove, PharmD []  Peggyann Juba, PharmD []  Reuel Boom, PharmD   Positive urine culture Treated with Ciprofloxacin, organism sensitive to the same and no further patient follow-up is required at this time.  [place] 07/03/2019, 10:33 AM

## 2019-07-08 ENCOUNTER — Encounter: Payer: Self-pay | Admitting: Cardiovascular Disease

## 2019-07-12 ENCOUNTER — Encounter: Payer: Self-pay | Admitting: Cardiovascular Disease

## 2019-07-12 ENCOUNTER — Ambulatory Visit (INDEPENDENT_AMBULATORY_CARE_PROVIDER_SITE_OTHER): Payer: Medicare Other | Admitting: Cardiovascular Disease

## 2019-07-12 ENCOUNTER — Other Ambulatory Visit: Payer: Self-pay

## 2019-07-12 VITALS — BP 122/84 | HR 53 | Ht 70.5 in | Wt 167.4 lb

## 2019-07-12 DIAGNOSIS — R002 Palpitations: Secondary | ICD-10-CM | POA: Diagnosis not present

## 2019-07-12 DIAGNOSIS — E782 Mixed hyperlipidemia: Secondary | ICD-10-CM

## 2019-07-12 DIAGNOSIS — I6523 Occlusion and stenosis of bilateral carotid arteries: Secondary | ICD-10-CM | POA: Diagnosis not present

## 2019-07-12 DIAGNOSIS — I10 Essential (primary) hypertension: Secondary | ICD-10-CM | POA: Diagnosis not present

## 2019-07-12 DIAGNOSIS — I2510 Atherosclerotic heart disease of native coronary artery without angina pectoris: Secondary | ICD-10-CM

## 2019-07-12 DIAGNOSIS — I701 Atherosclerosis of renal artery: Secondary | ICD-10-CM

## 2019-07-12 NOTE — Progress Notes (Signed)
07/12/2019 Edward [name]   06-May-1930  [id number]  Primary Physician Mayra Neer, MD Primary Cardiologist: Lorretta Harp MD FACP, [place], [place], [place]  HPI:  Edward [name] is a 85 y.o.  married Caucasian male father of 2 children, grandfather of 5 grandchildren who worked at [employer] in the past. I last saw him in the office  06/01/2018.  His wife Edward [name] is also a patient of mine.  His primary care physician is Dr. Serita [name]. He was referred by Dr. Casandra [name] for peripheral vascular evaluation but has expressed a desire to follow up with me for his cardiology care as well in the future. He has a history of treated hypertension and hyperlipidemia. He has never smoked. Her father did have A. Myocardial infarction in his 18s. He had an myocardial function in October of 2001 and had 2 NIR bare metal stents placed in his RCA by Dr. Quay [name] 08/20/00. He has had renal artery stenting bilaterally performed by Dr. Rolm [name] 06/18/06. He had carotid Dopplers performed recently that showed moderate bilateral internal carotid artery stenosis with incidentally noted bilateral subclavian artery disease. His vertebralantegrade on the left and apparent on the right. There was a 40-50 mm pressure differential with the right arm being less than the left. He does measure his blood pressure twice a day and has noticed evening hypertension.  Since I saw him a year ago he has lost hearing in his left ear on 05/08/2018 for unclear reasons.  Otherwise, he denies chest pain or shortness of breath.  He has had a UTI as well treated with ciprofloxacin seen in the ER because of this.  His major complaints are palpitations however.    Current Meds  Medication Sig  . aspirin EC 81 MG tablet Take 1 tablet (81 mg total) by mouth daily.  Marland Kitchen atorvastatin (LIPITOR) 40 MG tablet Take 40 mg by mouth daily.  . Calcium Carbonate-Vitamin D (CALCIUM PLUS VITAMIN D PO) Take 1 tablet by mouth daily.   . chlorthalidone  (HYGROTON) 25 MG tablet Take 1 tablet (25 mg total) by mouth daily.  . chlorthalidone (HYGROTON) 25 MG tablet Take 1 tablet (25 mg total) by mouth daily. Keep scheduled Appointment  . ciprofloxacin (CIPRO) 500 MG tablet Take 1 tablet (500 mg total) by mouth 2 (two) times daily.  . cloNIDine (CATAPRES) 0.1 MG tablet TAKE 2 TABLETS BY MOUTH EVERY DAY  . Coenzyme Q10 (CO Q-10) 100 MG CAPS Take 100 mg by mouth daily.  . finasteride (PROSCAR) 5 MG tablet Take 1 tablet by mouth daily.  . folic acid (FOLVITE) A999333 MCG tablet Take 400 mcg by mouth daily.  Marland Kitchen LORazepam (ATIVAN) 1 MG tablet Take 1 mg by mouth 2 (two) times daily.   . metoprolol tartrate (LOPRESSOR) 50 MG tablet TAKE 1 TABLET BY MOUTH TWICE A DAY**DUE FOR OFFICE VISIT PER DOCTOR**  . Multiple Vitamins-Minerals (ICAPS AREDS FORMULA PO) Take by mouth 2 (two) times daily.  . Omega-3 Fatty Acids (FISH OIL) 1000 MG CAPS Take 1,000 mg by mouth daily.   Marland Kitchen omeprazole (PRILOSEC) 20 MG capsule Take 20 mg by mouth daily.  Edward [name] (SYSTANE) 0.4-0.3 % SOLN Apply to eye at bedtime.  . tamsulosin (FLOMAX) 0.4 MG CAPS capsule Take 1 capsule by mouth daily.  Marland Kitchen triamcinolone cream (KENALOG) 0.1 % Apply 1 application topically as needed.   . valsartan (DIOVAN) 320 MG tablet TAKE 0.5 TABLETS (160 MG TOTAL) BY MOUTH 2 (TWO)  TIMES DAILY.     Allergies  Allergen Reactions  . Amlodipine Other (See Comments)    Tolerates 2.5 mg, higher doses cause LEE with blistering  . Erythromycin   . Penicillins   . Prednisone     Social History   Socioeconomic History  . Marital status: Married    Spouse name: Not on file  . Number of children: Not on file  . Years of education: Not on file  . Highest education level: Not on file  Occupational History  . Not on file  Social Needs  . Financial resource strain: Not on file  . Food insecurity    Worry: Not on file    Inability: Not on file  . Transportation needs    Medical: Not on  file    Non-medical: Not on file  Tobacco Use  . Smoking status: Never Smoker  . Smokeless tobacco: Never Used  Substance and Sexual Activity  . Alcohol use: No  . Drug use: No  . Sexual activity: Not on file  Lifestyle  . Physical activity    Days per week: Not on file    Minutes per session: Not on file  . Stress: Not on file  Relationships  . Social Herbalist on phone: Not on file    Gets together: Not on file    Attends religious service: Not on file    Active member of club or organization: Not on file    Attends meetings of clubs or organizations: Not on file    Relationship status: Not on file  . Intimate partner violence    Fear of current or ex partner: Not on file    Emotionally abused: Not on file    Physically abused: Not on file    Forced sexual activity: Not on file  Other Topics Concern  . Not on file  Social History Narrative  . Not on file     Review of Systems: General: negative for chills, fever, night sweats or weight changes.  Cardiovascular: negative for chest pain, dyspnea on exertion, edema, orthopnea, palpitations, paroxysmal nocturnal dyspnea or shortness of breath Dermatological: negative for rash Respiratory: negative for cough or wheezing Urologic: negative for hematuria Abdominal: negative for nausea, vomiting, diarrhea, bright red blood per rectum, melena, or hematemesis Neurologic: negative for visual changes, syncope, or dizziness All other systems reviewed and are otherwise negative except as noted above.    Blood pressure (!) 185/73, pulse (!) 53, height 5' 10.5" (1.791 m), weight 167 lb 6.4 oz (75.9 kg), SpO2 98 %.  General appearance: alert and no distress Neck: no adenopathy, no carotid bruit, no JVD, supple, symmetrical, trachea midline and thyroid not enlarged, symmetric, no tenderness/mass/nodules Lungs: clear to auscultation bilaterally Heart: regular rate and rhythm, S1, S2 normal, no murmur, click, rub or gallop  Extremities: extremities normal, atraumatic, no cyanosis or edema Pulses: 2+ and symmetric Skin: Skin color, texture, turgor normal. No rashes or lesions Neurologic: Alert and oriented X 3, normal strength and tone. Normal symmetric reflexes. Normal coordination and gait  EKG sinus bradycardia 53 with right bundle branch block.  I personally reviewed this EKG.  ASSESSMENT AND PLAN:   Coronary atherosclerosis of native coronary artery History of CAD status post myocardial infarction October 2001, PCI and stenting using 2 NIR bare-metal stents to his RCA placed by Dr. Ilda Foil 08/20/2000.  He denies chest pain.  Mixed hyperlipidemia History of hyperlipidemia on statin therapy with lipid profile performed 01/12/2019 revealing  total cholesterol 131, LDL 58 and HDL 41.  Carotid artery stenosis History of carotid artery disease with recent carotid Doppler performed 10/05/2018 revealing moderate bilateral ICA stenosis.  We will repeat this in December of this year.  Essential hypertension, benign History of essential hypertension with blood pressure measured at 185/73 initially and on repeat 122/84.  He is on chlorthalidone, Catapres and metoprolol.  Renal artery stenosis History of renal eye stenosis status post bilateral renal artery stenting by Dr. Ardeen Garland 06/18/2006.  Renal Dopplers performed 11/14/2016 revealed these to be widely patent.  Palpitations History of palpitations now occurring more frequently on beta-blocker.  We will check a 2-week ZIO patch.      Lorretta Harp MD FACP,FACC,FAHA, [facility] 07/12/2019 11:12 AM

## 2019-07-12 NOTE — Patient Instructions (Addendum)
Medication Instructions:  Your physician recommends that you continue on your current medications as directed. Please refer to the Current Medication list given to you today.  If you need a refill on your cardiac medications before your next appointment, please call your pharmacy.   Lab work: NONE If you have labs (blood work) drawn today and your tests are completely normal, you will receive your results only by: Marland Kitchen MyChart Message (if you have MyChart) OR . A paper copy in the mail If you have any lab test that is abnormal or we need to change your treatment, we will call you to review the results.  Testing/Procedures: Your physician has recommended that you wear a 14 DAY ZIO-PATCH monitor. The Zio patch cardiac monitor continuously records heart rhythm data for up to 14 days, this is for patients being evaluated for multiple types heart rhythms. For the first 24 hours post application, please avoid getting the Zio monitor wet in the shower or by excessive sweating during exercise. After that, feel free to carry on with regular activities. Keep soaps and lotions away from the ZIO XT Patch.  This will be placed at our [facility] location - [address] [place].    TO BE SCHEDULED       Your physician has requested that you have a carotid duplex. This test is an ultrasound of the carotid arteries in your neck. It looks at blood flow through these arteries that supply the brain with blood. Allow one hour for this exam. There are no restrictions or special instructions. TO BE SCHEDULED FOR December 2020   Follow-Up: At [facility], you and your health needs are our priority.  As part of our continuing mission to provide you with exceptional heart care, we have created designated Provider Care Teams.  These Care Teams include your primary Cardiologist (physician) and Advanced Practice Providers (APPs -  Physician Assistants and Nurse Practitioners) who all work  together to provide you with the care you need, when you need it. You will need a follow up appointment in 12 months with Dr. Quay Burow.  Please call our office 2 months in advance to schedule this appointment.

## 2019-07-12 NOTE — Assessment & Plan Note (Signed)
History of essential hypertension with blood pressure measured at 185/73 initially and on repeat 122/84.  He is on chlorthalidone, Catapres and metoprolol.

## 2019-07-12 NOTE — Assessment & Plan Note (Signed)
History of palpitations now occurring more frequently on beta-blocker.  We will check a 2-week ZIO patch.

## 2019-07-12 NOTE — Assessment & Plan Note (Signed)
History of CAD status post myocardial infarction October 2001, PCI and stenting using 2 NIR bare-metal stents to his RCA placed by Dr. Ilda Foil 08/20/2000.  He denies chest pain.

## 2019-07-12 NOTE — Assessment & Plan Note (Signed)
History of carotid artery disease with recent carotid Doppler performed 10/05/2018 revealing moderate bilateral ICA stenosis.  We will repeat this in December of this year.

## 2019-07-12 NOTE — Assessment & Plan Note (Addendum)
History of renal eye stenosis status post bilateral renal artery stenting by Dr. Ardeen Garland 06/18/2006.  Renal Dopplers performed 11/14/2016 revealed these to be widely patent.

## 2019-07-12 NOTE — Assessment & Plan Note (Signed)
History of hyperlipidemia on statin therapy with lipid profile performed 01/12/2019 revealing total cholesterol 131, LDL 58 and HDL 41.

## 2019-07-13 ENCOUNTER — Telehealth: Payer: Self-pay | Admitting: *Deleted

## 2019-07-13 NOTE — Telephone Encounter (Signed)
14 day ZIO XT long term holter monitor to be mailed to the patients home.  Instructions reviewed briefly as they are included in the monitor kit. 

## 2019-07-18 ENCOUNTER — Ambulatory Visit (INDEPENDENT_AMBULATORY_CARE_PROVIDER_SITE_OTHER): Payer: Medicare Other

## 2019-07-18 DIAGNOSIS — R002 Palpitations: Secondary | ICD-10-CM | POA: Diagnosis not present

## 2019-07-31 ENCOUNTER — Other Ambulatory Visit: Payer: Self-pay | Admitting: Cardiovascular Disease

## 2019-08-04 ENCOUNTER — Other Ambulatory Visit: Payer: Self-pay | Admitting: Cardiovascular Disease

## 2019-08-09 ENCOUNTER — Other Ambulatory Visit: Payer: Self-pay

## 2019-08-10 ENCOUNTER — Telehealth: Payer: Self-pay

## 2019-08-10 MED ORDER — METOPROLOL TARTRATE 50 MG PO TABS: 50.0000 mg | ORAL_TABLET | Freq: Two times a day (BID) | ORAL | 3 refills | Status: DC

## 2019-08-10 NOTE — Telephone Encounter (Signed)
Metoprolol sent to [facility] on [address].

## 2019-09-13 ENCOUNTER — Other Ambulatory Visit: Payer: Self-pay | Admitting: Cardiovascular Disease

## 2019-10-05 ENCOUNTER — Other Ambulatory Visit: Payer: Self-pay

## 2019-10-05 ENCOUNTER — Ambulatory Visit (HOSPITAL_COMMUNITY)
Admission: RE | Admit: 2019-10-05 | Discharge: 2019-10-05 | Disposition: A | Discharge status: Home / Self Care | Payer: Medicare Other | Source: Ambulatory Visit | Attending: Cardiovascular Disease | Admitting: Cardiovascular Disease

## 2019-10-05 DIAGNOSIS — I6529 Occlusion and stenosis of unspecified carotid artery: Secondary | ICD-10-CM | POA: Diagnosis not present

## 2019-10-07 DIAGNOSIS — I6529 Occlusion and stenosis of unspecified carotid artery: Secondary | ICD-10-CM

## 2019-10-07 DIAGNOSIS — I6523 Occlusion and stenosis of bilateral carotid arteries: Secondary | ICD-10-CM

## 2019-10-08 ENCOUNTER — Other Ambulatory Visit: Payer: Self-pay | Admitting: Cardiovascular Disease

## 2019-11-25 ENCOUNTER — Other Ambulatory Visit: Payer: Self-pay | Admitting: Cardiovascular Disease

## 2019-11-25 NOTE — Telephone Encounter (Signed)
New Message    *STAT* If patient is at the pharmacy, call can be transferred to refill team.   1. Which medications need to be refilled? (please list name of each medication and dose if known) valsartan (DIOVAN) 320 MG tablet    2. Which pharmacy/location (including street and city if local pharmacy) is medication to be sent to? [id number] [place] [place] [address].  3. Do they need a 30 day or 90 day supply? [place]

## 2019-11-25 NOTE — Telephone Encounter (Signed)
Valsartan has already been sent to pharmacy as of today.

## 2020-03-09 ENCOUNTER — Telehealth: Payer: Self-pay | Admitting: Cardiovascular Disease

## 2020-03-09 NOTE — Telephone Encounter (Signed)
Pt c/o BP issue:  1. What are your last 5 BP readings?  5/5 165/73 58  159/66 54 5/6 156/76 55  176/75 53 5/7 154/70 57   2. Are you having any other symptoms (ex. Dizziness, headache, blurred vision, passed out)? None, just swollen ankles.   3. What is your medication issue? None.  He did try a change in diet.  On 5/1 210/93 and he went to the ER, he was at the [facility], they ran test  They did not keep him overnight, they discharged him from the ER.

## 2020-03-09 NOTE — Telephone Encounter (Signed)
Returned the call to the patient. He stated [place]. He will bring the test results and paperwork with him to his wife's appointment on 5/11 so that Dr. Gwenlyn Found can have them.  BP readings:  Before medications:  5/5       165/73 58 in the morning, before medication             159/66 54 night 5/6       156/76 55 in the morning, before medication             176/75 53 night 5/7       154/70 57 in the morning, before medication  Currently taking: Chlorthalidone 25 mg once daily, afternoon Metoprolol 50 mg bid Clonidine 0.1 mg once daily, evening Valsartan 160 mg bid  He has been advised to check his blood pressure an hour or two after he takes his medication so that we may see how the medication is working for him. He has also been advised to restrict his sodium in take.   He denies any swelling stating that this has cleared up.

## 2020-05-16 ENCOUNTER — Other Ambulatory Visit: Payer: Self-pay | Admitting: Cardiovascular Disease

## 2020-07-14 ENCOUNTER — Other Ambulatory Visit: Payer: Self-pay | Admitting: Cardiovascular Disease

## 2020-07-27 ENCOUNTER — Encounter: Payer: Self-pay | Admitting: Cardiovascular Disease

## 2020-07-27 ENCOUNTER — Ambulatory Visit: Payer: Medicare Other | Admitting: Cardiovascular Disease

## 2020-07-27 ENCOUNTER — Other Ambulatory Visit: Payer: Self-pay

## 2020-07-27 VITALS — BP 136/56 | HR 53 | Ht 70.5 in | Wt 168.6 lb

## 2020-07-27 DIAGNOSIS — I499 Cardiac arrhythmia, unspecified: Secondary | ICD-10-CM | POA: Diagnosis not present

## 2020-07-27 DIAGNOSIS — I10 Essential (primary) hypertension: Secondary | ICD-10-CM | POA: Diagnosis not present

## 2020-07-27 DIAGNOSIS — E782 Mixed hyperlipidemia: Secondary | ICD-10-CM

## 2020-07-27 DIAGNOSIS — R002 Palpitations: Secondary | ICD-10-CM

## 2020-07-27 DIAGNOSIS — I701 Atherosclerosis of renal artery: Secondary | ICD-10-CM

## 2020-07-27 DIAGNOSIS — I6523 Occlusion and stenosis of bilateral carotid arteries: Secondary | ICD-10-CM

## 2020-07-27 NOTE — Assessment & Plan Note (Signed)
History of essential hypertension blood pressure measured today at 136/56.  He is on chlorthalidone, clonidine, metoprolol and valsartan.

## 2020-07-27 NOTE — Assessment & Plan Note (Signed)
History of bilateral renal artery stenosis status post stenting by Dr. Rolm Baptise 06/18/2006.  His last renal Dopplers performed 11/14/2016 revealed widely patent stents.

## 2020-07-27 NOTE — Assessment & Plan Note (Signed)
History of moderate bilateral ICA stenosis with subclavian disease as well by duplex ultrasound performed 10/05/2019.  We will repeat this this coming December

## 2020-07-27 NOTE — Assessment & Plan Note (Signed)
History of hyperlipidemia on statin therapy with lipid profile performed 01/25/2020 revealing total cholesterol 132, LDL 67 and HDL 41

## 2020-07-27 NOTE — Assessment & Plan Note (Signed)
History of CAD status post RCA stenting with 2 NIR bare-metal stents by Dr. Quay Burow October 2001.  He otherwise denies chest pain or shortness of breath.

## 2020-07-27 NOTE — Patient Instructions (Addendum)
Medication Instructions:  No Changes In Medications at this time.  *If you need a refill on your cardiac medications before your next appointment, please call your pharmacy*  Lab Work: None Ordered At This Time.  If you have labs (blood work) drawn today and your tests are completely normal, you will receive your results only by: Marland Kitchen MyChart Message (if you have MyChart) OR . A paper copy in the mail If you have any lab test that is abnormal or we need to change your treatment, we will call you to review the results.  Testing/Procedures: Your physician has requested that you have a carotid duplex (PLEASE SCHEDULE THIS FOR December). This test is an ultrasound of the carotid arteries in your neck. It looks at blood flow through these arteries that supply the brain with blood. Allow one hour for this exam. There are no restrictions or special instructions.  Follow-Up: At [facility], you and your health needs are our priority.  As part of our continuing mission to provide you with exceptional heart care, we have created designated Provider Care Teams.  These Care Teams include your primary Cardiologist (physician) and Advanced Practice Providers (APPs -  Physician Assistants and Nurse Practitioners) who all work together to provide you with the care you need, when you need it.  We recommend signing up for the patient portal called "MyChart".  Sign up information is provided on this After Visit Summary.  MyChart is used to connect with patients for Virtual Visits (Telemedicine).  Patients are able to view lab/test results, encounter notes, upcoming appointments, etc.  Non-urgent messages can be sent to your provider as well.   To learn more about what you can do with MyChart, go to [email or web address].    Your next appointment:   1 year(s)  The format for your next appointment:   In Person  Provider:   Quay Burow, MD

## 2020-07-27 NOTE — Progress Notes (Signed)
07/27/2020 [name]   [birth year]  [id number]  Primary Physician Mayra Neer, MD Primary Cardiologist: Lorretta Harp MD FACP, [place], [place], [place]  HPI:  Edward [name] is a 86 y.o.  married Caucasian male father of 2 children, grandfather of 5 grandchildren who worked at [employer] in the past. I last saw Edward [name] in the office  07/12/2019.Edward [name] wife [name] is also a patient of mine who unfortunately has recent been diagnosed with Alzheimer's. Edward [name] primary care physician is Dr. Serita Grammes. Edward [name] was referred by Dr. Casandra Doffing for peripheral vascular evaluation but has expressed a desire to follow up with me for Edward [name] cardiology care as well in the future. Edward [name] has a history of treated hypertension and hyperlipidemia. Edward [name] has never smoked. Her father did have A. Myocardial infarction in Edward [name] 40s. Edward [name] had an myocardial function in October of 2001 and had 2 NIR bare metal stents placed in Edward [name] RCA by Dr. Quay Burow 08/20/00. Edward [name] has had renal artery stenting bilaterally performed by Dr. Rolm Baptise 06/18/06. Edward [name] had carotid Dopplers performed recently that showed moderate bilateral internal carotid artery stenosis with incidentally noted bilateral subclavian artery disease. Edward [name] vertebralantegrade on the left and apparent on the right. There was a 40-50 mm pressure differential with the right arm being less than the left.Edward [name] does measure Edward [name] blood pressure twice a day and has noticed evening hypertension.  Since I saw Edward [name] a year ago Edward [name] has lost hearing in Edward [name] left ear on 05/08/2018 for unclear reasons. Otherwise, Edward [name] denies chest pain or shortness of breath.  Edward [name] has had a UTI as well treated with ciprofloxacin seen in the ER because of this.  Edward [name] major complaints are palpitations however.    Since I saw Edward [name] a year ago Edward [name] continues to do well.  Edward [name] still complains of occasional palpitations and mild fatigue and dyspnea but Edward [name] still does yard work and mows Edward [name] own lawn.  Edward [name] denies chest pain.   Current Meds    Medication Sig  . aspirin EC 81 MG tablet Take 1 tablet (81 mg total) by mouth daily.  Marland Kitchen atorvastatin (LIPITOR) 40 MG tablet Take 40 mg by mouth daily.  . Calcium Carbonate-Vitamin D (CALCIUM PLUS VITAMIN D PO) Take 1 tablet by mouth daily.   . chlorthalidone (HYGROTON) 25 MG tablet Take 1 tablet (25 mg total) by mouth daily.  . cloNIDine (CATAPRES) 0.1 MG tablet TAKE 2 TABLETS BY MOUTH EVERY DAY  . Coenzyme Q10 (CO Q-10) 100 MG CAPS Take 100 mg by mouth daily.  . finasteride (PROSCAR) 5 MG tablet Take 1 tablet by mouth daily.  . folic acid (FOLVITE) 623 MCG tablet Take 400 mcg by mouth daily.  Marland Kitchen LORazepam (ATIVAN) 1 MG tablet Take 1 mg by mouth 2 (two) times daily.   . metoprolol tartrate (LOPRESSOR) 50 MG tablet Take 1 tablet (50 mg total) by mouth 2 (two) times daily.  . Multiple Vitamins-Minerals (ICAPS AREDS FORMULA PO) Take by mouth 2 (two) times daily.  . Omega-3 Fatty Acids (FISH OIL) 1000 MG CAPS Take 1,000 mg by mouth daily.   Marland Kitchen omeprazole (PRILOSEC) 20 MG capsule Take 20 mg by mouth daily.  . tamsulosin (FLOMAX) 0.4 MG CAPS capsule Take 1 capsule by mouth daily.  Marland Kitchen triamcinolone cream (KENALOG) 0.1 % Apply 1 application topically as needed.   . valsartan (DIOVAN) 320 MG tablet TAKE 0.5 TABLETS (160 MG TOTAL) BY MOUTH 2 (TWO) TIMES DAILY.     Allergies  Allergen Reactions  . Amlodipine Other (See Comments)    Tolerates 2.5 mg, higher doses cause LEE with blistering  . Erythromycin   . Penicillins   . Prednisone     Social History   Socioeconomic History  . Marital status: Married    Spouse name: Not on file  . Number of children: Not on file  . Years of education: Not on file  . Highest education level: Not on file  Occupational History  . Not on file  Tobacco Use  . Smoking status: Never Smoker  . Smokeless tobacco: Never Used  Vaping Use  . Vaping Use: Never used  Substance and Sexual Activity  . Alcohol use: No  . Drug use: No  . Sexual activity: Not on  file  Other Topics Concern  . Not on file  Social History Narrative  . Not on file   Social Determinants of Health   Financial Resource Strain:   . Difficulty of Paying Living Expenses: Not on file  Food Insecurity:   . Worried About Charity fundraiser in the Last Year: Not on file  . Ran Out of Food in the Last Year: Not on file  Transportation Needs:   . Lack of Transportation (Medical): Not on file  . Lack of Transportation (Non-Medical): Not on file  Physical Activity:   . Days of Exercise per Week: Not on file  . Minutes of Exercise per Session: Not on file  Stress:   . Feeling of Stress : Not on file  Social Connections:   . Frequency of Communication with Friends and Family: Not on file  . Frequency of Social Gatherings with Friends and Family: Not on file  . Attends Religious Services: Not on file  . Active Member of Clubs or Organizations: Not on file  . Attends Archivist Meetings: Not on file  . Marital Status: Not on file  Intimate Partner Violence:   . Fear of Current or Ex-Partner: Not on file  . Emotionally Abused: Not on file  . Physically Abused: Not on file  . Sexually Abused: Not on file     Review of Systems: General: negative for chills, fever, night sweats or weight changes.  Cardiovascular: negative for chest pain, dyspnea on exertion, edema, orthopnea, palpitations, paroxysmal nocturnal dyspnea or shortness of breath Dermatological: negative for rash Respiratory: negative for cough or wheezing Urologic: negative for hematuria Abdominal: negative for nausea, vomiting, diarrhea, bright red blood per rectum, melena, or hematemesis Neurologic: negative for visual changes, syncope, or dizziness All other systems reviewed and are otherwise negative except as noted above.    Blood pressure (!) 136/56, pulse (!) 53, height 5' 10.5" (1.791 m), weight 168 lb 9.6 oz (76.5 kg), SpO2 92 %.  General appearance: alert and no distress Neck: no  adenopathy, no JVD, supple, symmetrical, trachea midline, thyroid not enlarged, symmetric, no tenderness/mass/nodules and Bilateral carotid and subclavian bruits Lungs: clear to auscultation bilaterally Heart: regular rate and rhythm, S1, S2 normal, no murmur, click, rub or gallop Extremities: extremities normal, atraumatic, no cyanosis or edema Pulses: 2+ and symmetric Skin: Skin color, texture, turgor normal. No rashes or lesions Neurologic: Alert and oriented X 3, normal strength and tone. Normal symmetric reflexes. Normal coordination and gait  EKG sinus bradycardia 52 with right bundle branch block.  I personally reviewed this EKG. ASSESSMENT AND PLAN:   Coronary atherosclerosis of native coronary artery History of CAD status post RCA stenting with 2 NIR bare-metal stents by  Dr. Quay Burow October 2001.  Edward [name] otherwise denies chest pain or shortness of breath.  Mixed hyperlipidemia History of hyperlipidemia on statin therapy with lipid profile performed 01/25/2020 revealing total cholesterol 132, LDL 67 and HDL 41  Carotid artery stenosis History of moderate bilateral ICA stenosis with subclavian disease as well by duplex ultrasound performed 10/05/2019.  We will repeat this this coming December  Essential hypertension, benign History of essential hypertension blood pressure measured today at 136/56.  Edward [name] is on chlorthalidone, clonidine, metoprolol and valsartan.  Palpitations History of palpitations with Zio patch performed last year that showed PACs with short runs of PSVT.  Renal artery stenosis History of bilateral renal artery stenosis status post stenting by Dr. Rolm Baptise 06/18/2006.  Edward [name] last renal Dopplers performed 11/14/2016 revealed widely patent stents.      Lorretta Harp MD FACP,FACC,FAHA, [facility] 07/27/2020 10:18 AM

## 2020-07-27 NOTE — Assessment & Plan Note (Signed)
History of palpitations with Zio patch performed last year that showed PACs with short runs of PSVT.

## 2020-11-14 ENCOUNTER — Ambulatory Visit (HOSPITAL_COMMUNITY)
Admission: RE | Admit: 2020-11-14 | Discharge: 2020-11-14 | Disposition: A | Discharge status: Home / Self Care | Payer: Medicare Other | Source: Ambulatory Visit | Attending: Cardiology | Admitting: Cardiology

## 2020-11-14 ENCOUNTER — Other Ambulatory Visit: Payer: Self-pay

## 2020-11-14 ENCOUNTER — Other Ambulatory Visit (HOSPITAL_COMMUNITY): Payer: Self-pay | Admitting: Cardiovascular Disease

## 2020-11-14 DIAGNOSIS — I6523 Occlusion and stenosis of bilateral carotid arteries: Secondary | ICD-10-CM | POA: Diagnosis not present

## 2020-11-14 DIAGNOSIS — G458 Other transient cerebral ischemic attacks and related syndromes: Secondary | ICD-10-CM

## 2020-11-14 DIAGNOSIS — I6529 Occlusion and stenosis of unspecified carotid artery: Secondary | ICD-10-CM | POA: Insufficient documentation

## 2020-12-06 ENCOUNTER — Other Ambulatory Visit: Payer: Self-pay | Admitting: Cardiovascular Disease

## 2020-12-14 ENCOUNTER — Other Ambulatory Visit: Payer: Self-pay | Admitting: Cardiovascular Disease

## 2020-12-28 ENCOUNTER — Other Ambulatory Visit: Payer: Self-pay | Admitting: Cardiovascular Disease

## 2020-12-29 ENCOUNTER — Other Ambulatory Visit: Payer: Self-pay | Admitting: Cardiovascular Disease

## 2021-01-03 ENCOUNTER — Telehealth: Payer: Self-pay | Admitting: Cardiovascular Disease

## 2021-01-03 NOTE — Telephone Encounter (Signed)
Spoke to Edward [name] on the phone regarding his blood pressure.  Pt states that he takes his blood pressure in the morning prior to taking his medications. Pt states that he is taking his medication as direct and hasn't missed any doses. Most recent blood pressure readings from the last 3 days:   03/01: 151/65             185/80  03/02: 168/69             188/80  03/03: 172/71  Asked pt if anything has changed with him recently, pt feels like he is hydrated and no changed in diet. Pt also states that he maintains a low sodium diet. Will send message to Dr. Gwenlyn Found to advise.

## 2021-01-03 NOTE — Telephone Encounter (Signed)
Pt c/o BP issue: STAT if pt c/o blurred vision, one-sided weakness or slurred speech  1. What are your last 5 BP readings?   03/01: 151/65  185/80  03/02: 168/69  188/80  03/03: 172/71  2. Are you having any other symptoms (ex. Dizziness, headache, blurred vision, passed out)? No   3. What is your BP issue?   Patient states his BP has been elevated.

## 2021-01-04 NOTE — Telephone Encounter (Signed)
Please have patient keep a 30-day blood pressure log and come back to see a Pharm.D. after that for review and evaluation, medicine titration

## 2021-01-07 NOTE — Telephone Encounter (Signed)
Spoke to pt with Dr. Kennon Holter recommendations.  Explained to pt that Dr. Gwenlyn Found would like him to keep a blood pressure log and see a pharmd in 1 month. Pt states that he is concerned that something may be going on. Explained to pt that we can get him in to see the PharmD in a week or 2 to get him checked out.

## 2021-01-09 ENCOUNTER — Telehealth: Payer: Self-pay | Admitting: Cardiovascular Disease

## 2021-01-09 ENCOUNTER — Encounter: Payer: Self-pay | Admitting: *Deleted

## 2021-01-09 ENCOUNTER — Ambulatory Visit (INDEPENDENT_AMBULATORY_CARE_PROVIDER_SITE_OTHER): Payer: Medicare Other

## 2021-01-09 DIAGNOSIS — R55 Syncope and collapse: Secondary | ICD-10-CM

## 2021-01-09 NOTE — Telephone Encounter (Signed)
Pt c/o Syncope: STAT if syncope occurred within 30 minutes and pt complains of lightheadedness High Priority if episode of passing out, completely, today or in last 24 hours   1. Did you pass out today? Yes  2. When is the last time you passed out? This morning   3. Has this occurred multiple times? Not to their knowledge   4. Did you have any symptoms prior to passing out? Elevated HR   Edward [name] is calling stating Edward [name] passed out this morning. She states he had an elevated HR prior, but no other symptoms were present. Since passing out the pt complains of elevated BP and HR. Edward [name] states his BP was 130/67 which may be high for him. She is requesting a nurse call him to schedule an appointment to f/u in regards to this as soon as possible. The patient was not with Edward [name] or otp in order to transfer STAT call to triage. Please advise.

## 2021-01-09 NOTE — Telephone Encounter (Addendum)
Spoke with pt, he reports for sometime now his bp has been elevated above his usual and he is currently tracking his bp for a follow up with the pharm md. He reports on the 3 rd he had an episode of elevated heart rate and then again last night. This morning he was feeling fine and after getting out of the shower he felt a little weak. After getting dressed he started down the hallway and went down to the floor, his wife prevented him from falling. He reports he lost bowel and bladder control. He got up and felt fine, cleaned himself up and went to the kitchen. His bp at that time was 130/65, he has eaten breakfast, took his medications and now feels fine. Discussed with dr Harrell Gave (DOD), patient will be scheduled for a 2d echo, a live 14 day monitor and then a follow up appointment with dr berry. He was encouraged to go to the ER for evaluation if this happens again. Echo and follow up scheduled.

## 2021-01-09 NOTE — Progress Notes (Signed)
Patient ID: Edward [name], male   DOB: [birth year], 87 y.o.   MRN: [id number] Patient enrolled for [employer] to ship a 14 day ZIO AT long term monitor-Live Telemetry to his home. Letter with instructions mailed to patient.

## 2021-01-09 NOTE — Telephone Encounter (Signed)
Spoke with Kennyth Lose at dr shaw's office, aware the patient will need a referral for neuorology due to the recent syncopal episode. She voiced understanding and will let her providers know.

## 2021-01-10 ENCOUNTER — Other Ambulatory Visit: Payer: Self-pay

## 2021-01-10 ENCOUNTER — Ambulatory Visit (HOSPITAL_COMMUNITY): Payer: Medicare Other | Attending: Cardiology

## 2021-01-10 DIAGNOSIS — R55 Syncope and collapse: Secondary | ICD-10-CM

## 2021-01-10 LAB — ECHOCARDIOGRAM COMPLETE
Area-P 1/2: 3.64 cm2
S' Lateral: 2.5 cm

## 2021-01-11 NOTE — Progress Notes (Signed)
This is your patient (called on my DOD day). EF normal but frequent ectopy, moderate MR, severe TR and elevated PASP. We set him up for a monitor as well. He has an appt with you 01/30/21.   Lars Mage, can you let him know that the heart squeezes well but is stiff (doesn't relax well). He also has leakage in two of his valves. Nothing urgent at this time, keep follow up with Dr. Gwenlyn Found to discuss further. Thanks.

## 2021-01-12 DIAGNOSIS — R55 Syncope and collapse: Secondary | ICD-10-CM

## 2021-01-13 DIAGNOSIS — R55 Syncope and collapse: Secondary | ICD-10-CM | POA: Diagnosis not present

## 2021-01-23 ENCOUNTER — Other Ambulatory Visit: Payer: Self-pay | Admitting: Cardiovascular Disease

## 2021-01-29 DIAGNOSIS — D2271 Melanocytic nevi of right lower limb, including hip: Secondary | ICD-10-CM | POA: Diagnosis not present

## 2021-01-29 DIAGNOSIS — D225 Melanocytic nevi of trunk: Secondary | ICD-10-CM | POA: Diagnosis not present

## 2021-01-29 DIAGNOSIS — L821 Other seborrheic keratosis: Secondary | ICD-10-CM | POA: Diagnosis not present

## 2021-01-29 DIAGNOSIS — D2262 Melanocytic nevi of left upper limb, including shoulder: Secondary | ICD-10-CM | POA: Diagnosis not present

## 2021-01-29 DIAGNOSIS — L720 Epidermal cyst: Secondary | ICD-10-CM | POA: Diagnosis not present

## 2021-01-29 DIAGNOSIS — C44311 Basal cell carcinoma of skin of nose: Secondary | ICD-10-CM | POA: Diagnosis not present

## 2021-01-29 DIAGNOSIS — L570 Actinic keratosis: Secondary | ICD-10-CM | POA: Diagnosis not present

## 2021-01-29 DIAGNOSIS — Z85828 Personal history of other malignant neoplasm of skin: Secondary | ICD-10-CM | POA: Diagnosis not present

## 2021-01-30 ENCOUNTER — Ambulatory Visit: Payer: Medicare Other | Admitting: Cardiovascular Disease

## 2021-01-30 ENCOUNTER — Other Ambulatory Visit: Payer: Self-pay

## 2021-01-30 ENCOUNTER — Encounter: Payer: Self-pay | Admitting: Cardiovascular Disease

## 2021-01-30 VITALS — BP 126/58 | Ht 70.5 in | Wt 171.0 lb

## 2021-01-30 DIAGNOSIS — I6523 Occlusion and stenosis of bilateral carotid arteries: Secondary | ICD-10-CM | POA: Diagnosis not present

## 2021-01-30 DIAGNOSIS — I10 Essential (primary) hypertension: Secondary | ICD-10-CM | POA: Diagnosis not present

## 2021-01-30 DIAGNOSIS — I701 Atherosclerosis of renal artery: Secondary | ICD-10-CM

## 2021-01-30 DIAGNOSIS — I771 Stricture of artery: Secondary | ICD-10-CM | POA: Diagnosis not present

## 2021-01-30 DIAGNOSIS — I2510 Atherosclerotic heart disease of native coronary artery without angina pectoris: Secondary | ICD-10-CM

## 2021-01-30 DIAGNOSIS — E782 Mixed hyperlipidemia: Secondary | ICD-10-CM | POA: Diagnosis not present

## 2021-01-30 DIAGNOSIS — R55 Syncope and collapse: Secondary | ICD-10-CM | POA: Insufficient documentation

## 2021-01-30 NOTE — Progress Notes (Addendum)
11/08/2021 Edward [name]   1929-12-30  [id number]  Primary Physician Edward Neer, MD Primary Cardiologist: Edward Harp MD FACP, Edward [name], Edward [name], Edward [name]  HPI:  Edward [name] is a 87 y.o.  married Caucasian male father of 2 children, grandfather of 5 grandchildren who worked at [employer]  in the past. I last saw him in the office  07/27/2020.  His wife Edward [name] is also a patient of mine who unfortunately has recent been diagnosed with Alzheimer's.  She accompanies him today.  His primary care physician is Dr. Serita [name]. He was referred by Dr. Casandra [name] for peripheral vascular evaluation but has expressed a desire to follow up with me for his cardiology care as well in the future. He has a history of treated hypertension and hyperlipidemia. He has never smoked. Her father did have A. Myocardial infarction in his 9s. He had an myocardial function in October of 2001 and had 2 NIR bare metal stents placed in his RCA by Dr. Quay [name] 08/20/00. He has had renal artery stenting bilaterally performed by Dr. Rolm [name] 06/18/06. He had carotid Dopplers performed recently that showed moderate bilateral internal carotid artery stenosis with incidentally noted bilateral subclavian artery disease. His vertebral antegrade on the left and apparent on the right. There was a 40-50 mm pressure differential with the right arm being less than the left.  He does measure his blood pressure twice a day and has noticed evening hypertension.   He has lost hearing in his left ear on 05/08/2018 for unclear reasons.  Otherwise, he denies chest pain or shortness of breath.  He has had a UTI as well treated with ciprofloxacin seen in the ER because of this.  His major complaints are palpitations however.     Since I saw him in the office 6 months ago he had done well until earlier this month when he had a witnessed syncopal episode for unclear reasons.  A 2D echo was unrevealing with normal LV systolic function and grade 2  diastolic dysfunction.  He had moderate pulmonary hypertension.  There is no evidence of valvular heart disease.  A 2-week Zio patch was ordered as well and was mailed in earlier this week.  He did have carotid Dopplers performed in January that showed moderate bilateral ICA stenosis with bilateral subclavian artery stenosis.  There was retrograde right vertebral filling with a 70 mm upper extremity blood pressure differential right lower than left.   Current Meds  Medication Sig   aspirin EC 81 MG tablet Take 1 tablet (81 mg total) by mouth daily.   atorvastatin (LIPITOR) 40 MG tablet Take 40 mg by mouth daily.   Calcium Carbonate-Vitamin D (CALCIUM PLUS VITAMIN D PO) Take 1 tablet by mouth daily.    Coenzyme Q10 (CO Q-10) 100 MG CAPS Take 100 mg by mouth daily.   finasteride (PROSCAR) 5 MG tablet Take 5 mg by mouth daily.   folic acid (FOLVITE) 983 MCG tablet Take 400 mcg by mouth daily.   LORazepam (ATIVAN) 1 MG tablet Take 1 mg by mouth in the morning and at bedtime.   Omega-3 Fatty Acids (FISH OIL) 1000 MG CAPS Take 1,200 mg by mouth daily.   omeprazole (PRILOSEC) 20 MG capsule Take 20 mg by mouth daily.   Polyethyl Glycol-Propyl Glycol 0.4-0.3 % SOLN Apply 1 drop to eye at bedtime as needed (dryness).   tamsulosin (FLOMAX) 0.4 MG CAPS capsule Take 0.4 mg by mouth daily.   triamcinolone  cream (KENALOG) 0.1 % Apply 1 application topically as needed (irritation). Apply to both legs   [DISCONTINUED] chlorthalidone (HYGROTON) 25 MG tablet Take 1 tablet (25 mg total) by mouth daily.   [DISCONTINUED] ciprofloxacin (CIPRO) 500 MG tablet Take 1 tablet (500 mg total) by mouth 2 (two) times daily.   [DISCONTINUED] cloNIDine (CATAPRES) 0.1 MG tablet TAKE 2 TABLETS BY MOUTH EVERY DAY   [DISCONTINUED] metoprolol tartrate (LOPRESSOR) 50 MG tablet TAKE 1 TABLET BY MOUTH TWICE A DAY (Patient taking differently: Take 50 mg by mouth 2 (two) times daily.)   [DISCONTINUED] Multiple Vitamins-Minerals (ICAPS  AREDS FORMULA PO) Take by mouth 2 (two) times daily.   [DISCONTINUED] valsartan (DIOVAN) 320 MG tablet TAKE 1/2 TABLET BY MOUTH 2 TIMES DAILY. PLEASE SCHEDULE AN APPT FOR FUTURE REFILLS (Patient taking differently: Take 160 mg by mouth in the morning and at bedtime. Schedule an appointment for future refills)     Allergies  Allergen Reactions   Amlodipine Other (See Comments)    Tolerates 2.5 mg, higher doses cause LEE with blistering   Erythromycin    Penicillins    Prednisone     Social History   Socioeconomic History   Marital status: Married    Spouse name: Not on file   Number of children: Not on file   Years of education: Not on file   Highest education level: Not on file  Occupational History   Not on file  Tobacco Use   Smoking status: Never   Smokeless tobacco: Never  Vaping Use   Vaping Use: Never used  Substance and Sexual Activity   Alcohol use: No   Drug use: No   Sexual activity: Not on file  Other Topics Concern   Not on file  Social History Narrative   Not on file   Social Determinants of Health   Financial Resource Strain: Not on file  Food Insecurity: Not on file  Transportation Needs: Not on file  Physical Activity: Not on file  Stress: Not on file  Social Connections: Not on file  Intimate Partner Violence: Not on file     Review of Systems: General: negative for chills, fever, night sweats or weight changes.  Cardiovascular: negative for chest pain, dyspnea on exertion, edema, orthopnea, palpitations, paroxysmal nocturnal dyspnea or shortness of breath Dermatological: negative for rash Respiratory: negative for cough or wheezing Urologic: negative for hematuria Abdominal: negative for nausea, vomiting, diarrhea, bright red blood per rectum, melena, or hematemesis Neurologic: negative for visual changes, syncope, or dizziness All other systems reviewed and are otherwise negative except as noted above.    Blood pressure (!) 126/58, height  5' 10.5" (1.791 m), weight 171 lb (77.6 kg).  General appearance: alert and no distress Neck: no adenopathy, no JVD, supple, symmetrical, trachea midline, thyroid not enlarged, symmetric, no tenderness/mass/nodules and Bilateral carotid and subclavian bruits Lungs: clear to auscultation bilaterally Heart: regular rate and rhythm, S1, S2 normal, no murmur, click, rub or gallop Extremities: extremities normal, atraumatic, no cyanosis or edema Pulses: 2+ and symmetric Skin: Skin color, texture, turgor normal. No rashes or lesions Neurologic: Alert and oriented X 3, normal strength and tone. Normal symmetric reflexes. Normal coordination and gait  EKG sinus bradycardia 51 with right bundle branch block.  I personally reviewed this EKG.  ASSESSMENT AND PLAN:   Coronary atherosclerosis of native coronary artery History of CAD status post myocardial infarction October 2001.  He had to NIR bare-metal stents placed in his RCA by Dr. Quay [name]  08/20/2000.  He denies chest pain or shortness of breath.  Mixed hyperlipidemia History of hyperlipidemia on statin therapy with lipid profile performed 08/03/2020 revealing total cholesterol 132, LDL of 64 and HDL 42.  More weight recent lab work performed 02/01/2021 revealed total cholesterol 142, LDL of 66 and HDL of 46.  More recent lipid profile performed 08/16/2021 revealed total cholesterol 126, LDL of 56 and HDL 38.  Carotid artery stenosis History of carotid artery disease with Doppler performed 11/14/2020 revealing moderate bilateral ICA stenosis.  Essential hypertension, benign History of essential hypertension a blood pressure measured today at 126/58.  He does check his blood pressure at home frequently and his readings have been somewhat higher than this.  He is on chlorthalidone, clonidine, metoprolol and valsartan.  Renal artery stenosis [facility]) History of bilateral renal artery stenting by Dr. Rolm [name] 06/18/2006.  Renal Dopplers performed  11/14/2016 revealed these to be widely patent.  Subclavian artery stenosis [facility]) Mr. Bob has bilateral subclavian artery stenosis by duplex ultrasound performed 11/14/2020.  He has retrograde right vertebral filling, antegrade left with a 70 mm gradient right blood pressure less then left.  Had a syncopal episode earlier this month.  Certainly it could be related to subclavian steal.  Further work-up is yet to be performed.  Syncope and collapse Mr. Irish Elders had a syncopal episode earlier this month witnessed by his wife.  He did have a 2D echo that showed normal LV systolic function without evidence of valvular heart disease.  He did have moderate pulmonary hypertension.  He wore a 2-week Zio patch which was mailed in this past Monday and has yet to be resulted.      Edward Harp MD FACP,FACC,FAHA, [facility] 11/08/2021 4:04 PM

## 2021-01-30 NOTE — Assessment & Plan Note (Signed)
History of carotid artery disease with Doppler performed 11/14/2020 revealing moderate bilateral ICA stenosis.

## 2021-01-30 NOTE — Assessment & Plan Note (Signed)
Edward [name] had a syncopal episode earlier this month witnessed by his wife.  He did have a 2D echo that showed normal LV systolic function without evidence of valvular heart disease.  He did have moderate pulmonary hypertension.  He wore a 2-week Zio patch which was mailed in this past Monday and has yet to be resulted.

## 2021-01-30 NOTE — Patient Instructions (Addendum)
Medication Instructions:  Your physician recommends that you continue on your current medications as directed. Please refer to the Current Medication list given to you today.  *If you need a refill on your cardiac medications before your next appointment, please call your pharmacy*  Testing/Procedures: Your physician has requested that you have a renal artery duplex. During this test, an ultrasound is used to evaluate blood flow to the kidneys. Allow one hour for this exam. Do not eat after midnight the day before and avoid carbonated beverages. Take your medications as you usually do. This procedure is done at [place]. 2nd Floor.   Follow-Up: At [facility], you and your health needs are our priority.  As part of our continuing mission to provide you with exceptional heart care, we have created designated Provider Care Teams.  These Care Teams include your primary Cardiologist (physician) and Advanced Practice Providers (APPs -  Physician Assistants and Nurse Practitioners) who all work together to provide you with the care you need, when you need it.  We recommend signing up for the patient portal called "MyChart".  Sign up information is provided on this After Visit Summary.  MyChart is used to connect with patients for Virtual Visits (Telemedicine).  Patients are able to view lab/test results, encounter notes, upcoming appointments, etc.  Non-urgent messages can be sent to your provider as well.   To learn more about what you can do with MyChart, go to [email or web address].    Your next appointment:   6 month(s)  The format for your next appointment:   In Person  Provider:   Quay Burow, MD   Other Instructions Please keep a blood pressure log for 30 days and return for a office visit with a PharmD for medication management.

## 2021-01-30 NOTE — Assessment & Plan Note (Signed)
History of bilateral renal artery stenting by Dr. Rolm Baptise 06/18/2006.  Renal Dopplers performed 11/14/2016 revealed these to be widely patent.

## 2021-01-30 NOTE — Assessment & Plan Note (Signed)
Edward [name] has bilateral subclavian artery stenosis by duplex ultrasound performed 11/14/2020.  He has retrograde right vertebral filling, antegrade left with a 70 mm gradient right blood pressure less then left.  Had a syncopal episode earlier this month.  Certainly it could be related to subclavian steal.  Further work-up is yet to be performed.

## 2021-01-30 NOTE — Assessment & Plan Note (Signed)
History of essential hypertension a blood pressure measured today at 126/58.  He does check his blood pressure at home frequently and his readings have been somewhat higher than this.  He is on chlorthalidone, clonidine, metoprolol and valsartan.

## 2021-01-30 NOTE — Assessment & Plan Note (Addendum)
History of hyperlipidemia on statin therapy with lipid profile performed 08/03/2020 revealing total cholesterol 132, LDL of 64 and HDL 42.  More weight recent lab work performed 02/01/2021 revealed total cholesterol 142, LDL of 66 and HDL of 46.  More recent lipid profile performed 08/16/2021 revealed total cholesterol 126, LDL of 56 and HDL 38.

## 2021-01-30 NOTE — Assessment & Plan Note (Signed)
History of CAD status post myocardial infarction October 2001.  He had to NIR bare-metal stents placed in his RCA by Dr. Quay Burow 08/20/2000.  He denies chest pain or shortness of breath.

## 2021-02-01 DIAGNOSIS — N183 Chronic kidney disease, stage 3 unspecified: Secondary | ICD-10-CM | POA: Diagnosis not present

## 2021-02-01 DIAGNOSIS — I701 Atherosclerosis of renal artery: Secondary | ICD-10-CM | POA: Diagnosis not present

## 2021-02-01 DIAGNOSIS — I779 Disorder of arteries and arterioles, unspecified: Secondary | ICD-10-CM | POA: Diagnosis not present

## 2021-02-01 DIAGNOSIS — E782 Mixed hyperlipidemia: Secondary | ICD-10-CM | POA: Diagnosis not present

## 2021-02-01 DIAGNOSIS — I25119 Atherosclerotic heart disease of native coronary artery with unspecified angina pectoris: Secondary | ICD-10-CM | POA: Diagnosis not present

## 2021-02-01 DIAGNOSIS — I150 Renovascular hypertension: Secondary | ICD-10-CM | POA: Diagnosis not present

## 2021-02-02 ENCOUNTER — Other Ambulatory Visit: Payer: Self-pay | Admitting: Cardiovascular Disease

## 2021-02-12 ENCOUNTER — Other Ambulatory Visit: Payer: Self-pay

## 2021-02-12 ENCOUNTER — Ambulatory Visit (HOSPITAL_COMMUNITY)
Admission: RE | Admit: 2021-02-12 | Discharge: 2021-02-12 | Disposition: A | Discharge status: Home / Self Care | Payer: Medicare Other | Source: Ambulatory Visit | Attending: Cardiovascular Disease | Admitting: Cardiovascular Disease

## 2021-02-12 DIAGNOSIS — I701 Atherosclerosis of renal artery: Secondary | ICD-10-CM | POA: Diagnosis not present

## 2021-02-26 ENCOUNTER — Telehealth: Payer: Self-pay | Admitting: Cardiovascular Disease

## 2021-02-26 NOTE — Telephone Encounter (Signed)
Patient's daughter called in to ask that her father be able to get a print out of his visits from now on. Daughter states that father is deaf in one ear so can barely hear and also dont remember everything that's talked back during his office visit.

## 2021-03-01 ENCOUNTER — Ambulatory Visit (INDEPENDENT_AMBULATORY_CARE_PROVIDER_SITE_OTHER): Payer: Medicare Other | Admitting: Pharmacist

## 2021-03-01 ENCOUNTER — Other Ambulatory Visit: Payer: Self-pay

## 2021-03-01 VITALS — BP 196/82 | HR 52 | Wt 168.8 lb

## 2021-03-01 DIAGNOSIS — I10 Essential (primary) hypertension: Secondary | ICD-10-CM | POA: Diagnosis not present

## 2021-03-01 NOTE — Patient Instructions (Addendum)
Return for a follow up appointment in 4-5 weeks  Check your blood pressure at home daily (if able) and keep record of the readings.  Take your BP meds as follows: *Change clonidine dose to 0.1mg  tablet at noon and another tablet at 5pm*  Bring all of your meds, your BP cuff and your record of home blood pressures to your next appointment.  Exercise as you're able, try to walk approximately 30 minutes per day.  Keep salt intake to a minimum, especially watch canned and prepared boxed foods.  Eat more fresh fruits and vegetables and fewer canned items.  Avoid eating in fast food restaurants.    HOW TO TAKE YOUR BLOOD PRESSURE: . Rest 5 minutes before taking your blood pressure. .  Don't smoke or drink caffeinated beverages for at least 30 minutes before. . Take your blood pressure before (not after) you eat. . Sit comfortably with your back supported and both feet on the floor (don't cross your legs). . Elevate your arm to heart level on a table or a desk. . Use the proper sized cuff. It should fit smoothly and snugly around your bare upper arm. There should be enough room to slip a fingertip under the cuff. The bottom edge of the cuff should be 1 inch above the crease of the elbow. . Ideally, take 3 measurements at one sitting and record the average.

## 2021-03-01 NOTE — Progress Notes (Signed)
Patient ID: SESAR MADEWELL                 DOB: [birth year]                      MRN: [id number]     HPI: Edward [name] is a 87 y.o. male referred by Dr. Gwenlyn Found to HTN clinic. PMH hypertension, hyperlipidemia, renal artery stenting in 06/18/2006, bilateral subclavian artery disease, and moderate pulmonary hypertension. During last OV with Dr.Berry, his BP was 126/58 ,but reported elevated BP at home evry evening. Noted renal stent still patent per renal US performed 02/12/2021.   Patient presents to cliic accompany by his wife. Notes his BP was elevated for few days after receiving diagnosis of skin cancer. He had Zio monitor for 7 days , but still waiting for results. Patient denies any symptoms with elevated BP.  Current HTN meds:  Chlorthalidone 25mg  daily - 1/2 tablet (lunch time) clonidine 0.1mg  2 tablets every day (5pm) Metoprolol tartrate 50mg  twice daily (9am and 5pm) Valsartan 320mg  1/2 tablets twice daily (9am & 5pm)  Previously tried:  Amlodipine - LEE at any dose above 2.5mg  daily  BP goal: 130/80 (140/90 if unable to tolerate lower goal)  Family History: no significant family history  Social History: no tobacco (despite working at [employer] for much of his career); no alcohol, no caffeine  Diet:avoid adding salt to his food  Exercise:sill very active working on his yard   Home BP readings:  21 morning BP readings, average 144/64, HR range 49-57bpm 20 evening readings, average 171/68 (range 376-283 systolic, 15-17 diastolic)  Wt Readings from Last 3 Encounters:  03/01/21 168 lb 12.8 oz (76.6 kg)  01/30/21 171 lb (77.6 kg)  07/27/20 168 lb 9.6 oz (76.5 kg)   BP Readings from Last 3 Encounters:  03/01/21 (!) 196/82  01/30/21 (!) 126/58  07/27/20 (!) 136/56   Pulse Readings from Last 3 Encounters:  03/01/21 (!) 52  07/27/20 (!) 53  07/12/19 (!) 53    Past Medical History:  Diagnosis Date  . Atherosclerosis of abdominal aorta (HCC)    CT/abd and pelvis  .  Carotid artery occlusion    bilateral carotid bruit, right greater then left -bilateral 40-50% stenosis, 12/26/09- no change  . Chronic anxiety   . Coronary artery disease    s/p BM stent, prox and mid RCA, 2001, cutting ballon RCA stenosis, 2002  . Dysphagia    from esophageal dysmotility-tx with careful eating.   Marland Kitchen History of echocardiogram    2/11 echo EF 70%, mild MR, mildly elevated pulmonary pressures 42 mmHg  . History of renal angiogram    9/10, showed patent renal stents, 30-40% instent restenosis ws the most severe lesion  . Hyperlipidemia   . Left kidney mass    lower pole, observing by urology- Dr. Reece Agar  . Peripheral neuropathy    in both feet from nerve compression   . Renovascular hypertension    s./p. bilateral RA stent implant  . Subclavian artery stenosis [facility])     Current Outpatient Medications on File Prior to Visit  Medication Sig Dispense Refill  . aspirin EC 81 MG tablet Take 1 tablet (81 mg total) by mouth daily. 90 tablet 3  . atorvastatin (LIPITOR) 40 MG tablet Take 40 mg by mouth daily.    . Calcium Carbonate-Vitamin D (CALCIUM PLUS VITAMIN D PO) Take 1 tablet by mouth daily.     . chlorthalidone (HYGROTON) 25 MG  tablet Take 1 tablet (25 mg total) by mouth daily. 90 tablet 0  . ciprofloxacin (CIPRO) 500 MG tablet Take 1 tablet (500 mg total) by mouth 2 (two) times daily. 14 tablet 0  . cloNIDine (CATAPRES) 0.1 MG tablet TAKE 2 TABLETS BY MOUTH EVERY DAY 180 tablet 3  . Coenzyme Q10 (CO Q-10) 100 MG CAPS Take 100 mg by mouth daily.    . finasteride (PROSCAR) 5 MG tablet Take 1 tablet by mouth daily.    . folic acid (FOLVITE) 056 MCG tablet Take 400 mcg by mouth daily.    Marland Kitchen LORazepam (ATIVAN) 1 MG tablet Take 1 mg by mouth 2 (two) times daily.     . metoprolol tartrate (LOPRESSOR) 50 MG tablet TAKE 1 TABLET BY MOUTH TWICE A DAY 180 tablet 3  . Multiple Vitamins-Minerals (ICAPS AREDS FORMULA PO) Take by mouth 2 (two) times daily.    . Omega-3 Fatty Acids  (FISH OIL) 1000 MG CAPS Take 1,000 mg by mouth daily.     Marland Kitchen omeprazole (PRILOSEC) 20 MG capsule Take 20 mg by mouth daily.    Vladimir Faster Glycol-Propyl Glycol 0.4-0.3 % SOLN Apply to eye at bedtime.    . tamsulosin (FLOMAX) 0.4 MG CAPS capsule Take 1 capsule by mouth daily.    Marland Kitchen triamcinolone cream (KENALOG) 0.1 % Apply 1 application topically as needed.     . valsartan (DIOVAN) 320 MG tablet TAKE 1/2 TABLET BY MOUTH 2 TIMES DAILY. PLEASE SCHEDULE AN APPT FOR FUTURE REFILLS 90 tablet 2   No current facility-administered medications on file prior to visit.    Allergies  Allergen Reactions  . Amlodipine Other (See Comments)    Tolerates 2.5 mg, higher doses cause LEE with blistering  . Erythromycin   . Penicillins   . Prednisone     Blood pressure (!) 196/82, pulse (!) 52, weight 168 lb 12.8 oz (76.6 kg).  Essential hypertension, benign BP above 190 while in office. Noted patient takes metoprolol twice daily and clonidine 0.2mg  at 5pm every day. We may be noticing some rebound from clonidine single daily dose. I tried unsuccessfully to d/c clonidine in the past (over 4 years ago) , but was able to control his BP with higher dose of chlorthalidone.  Will change clonidine to 0.1mg  twice daily, and continue all other medication as prescribed. Plan to follow up in 4 weeks and titrate chlorthalidone dose if needed.   Metha Kolasa Rodriguez-Guzman PharmD, BCPS, [place] [address] [place] [address] 03/06/2021 4:00 PM

## 2021-03-06 ENCOUNTER — Encounter: Payer: Self-pay | Admitting: Pharmacist

## 2021-03-06 NOTE — Assessment & Plan Note (Signed)
BP above 190 while in office. Noted patient takes metoprolol twice daily and clonidine 0.2mg  at 5pm every day. We may be noticing some rebound from clonidine single daily dose. I tried unsuccessfully to d/c clonidine in the past (over 4 years ago) , but was able to control his BP with higher dose of chlorthalidone.  Will change clonidine to 0.1mg  twice daily, and continue all other medication as prescribed. Plan to follow up in 4 weeks and titrate chlorthalidone dose if needed.

## 2021-03-26 ENCOUNTER — Ambulatory Visit (INDEPENDENT_AMBULATORY_CARE_PROVIDER_SITE_OTHER): Payer: Medicare Other | Admitting: Pharmacist Clinician (PhC)/ Clinical Pharmacy Specialist

## 2021-03-26 ENCOUNTER — Other Ambulatory Visit: Payer: Self-pay

## 2021-03-26 VITALS — BP 162/74 | HR 52 | Resp 17 | Ht 69.25 in | Wt 169.4 lb

## 2021-03-26 DIAGNOSIS — I10 Essential (primary) hypertension: Secondary | ICD-10-CM

## 2021-03-26 MED ORDER — CHLORTHALIDONE 25 MG PO TABS: 25.0000 mg | ORAL_TABLET | Freq: Every day | ORAL | 3 refills | Status: DC

## 2021-03-26 NOTE — Patient Instructions (Signed)
Return for a a follow up appointment July 5 at 8:30 am  Go to the lab in 2 weeks to check kidney function (week of June 6)  Check your blood pressure at home twice daily as able and keep record of the readings.  Take your BP meds as follows:  Increase chlorthalidone to 1 tablet (25 mg) once daily at lunchtime  Continue with all other medications  Bring all of your meds, your BP cuff and your record of home blood pressures to your next appointment.  Exercise as you're able, try to walk approximately 30 minutes per day.  Keep salt intake to a minimum, especially watch canned and prepared boxed foods.  Eat more fresh fruits and vegetables and fewer canned items.  Avoid eating in fast food restaurants.    HOW TO TAKE YOUR BLOOD PRESSURE: . Rest 5 minutes before taking your blood pressure. .  Don't smoke or drink caffeinated beverages for at least 30 minutes before. . Take your blood pressure before (not after) you eat. . Sit comfortably with your back supported and both feet on the floor (don't cross your legs). . Elevate your arm to heart level on a table or a desk. . Use the proper sized cuff. It should fit smoothly and snugly around your bare upper arm. There should be enough room to slip a fingertip under the cuff. The bottom edge of the cuff should be 1 inch above the crease of the elbow. . Ideally, take 3 measurements at one sitting and record the average.

## 2021-03-26 NOTE — Assessment & Plan Note (Signed)
Patient with essential hypertension, good control in the mornings, but still quite elevated in the evenings.  Will have him increase the chlorthalidone to 25 mg daily (he takes at noon) and repeat metabolic panel in 2 weeks.  He should continue with regular home BP monitoring and we will see him back in the office in 6 weeks for follow up.

## 2021-03-26 NOTE — Progress Notes (Signed)
Patient ID: Edward [name]                 DOB: [birth year]                      MRN: [id number]     HPI: Edward [name] is a 87 y.o. male referred by Dr. Gwenlyn Found to HTN clinic. PMH hypertension, hyperlipidemia, renal artery stenting in 06/18/2006, bilateral subclavian artery disease, and moderate pulmonary hypertension. During last OV with Dr.Berry, his BP was 126/58 ,but reported elevated BP at home every evening. Noted renal stent still patent per renal US performed 02/12/2021.  At a follow up visit with [name] his pressure was noted to be 196/82.  She asked that he divide the clonidine dose from 0.2 mg once daily to 0.1 mg bid.    He returns today for follow up.  He notes that he has some congestion in the evenings now, when he took the clonidine 0.2 mg previously, it kept him "dried out'.  Otherwise he has no complaints today, and states compliance with all medications.    Current HTN meds:  Chlorthalidone 25mg  daily - 1/2 tablet (lunch time) clonidine 0.1mg  bid (lunch and 5 pm) Metoprolol tartrate 50mg  twice daily (9am and 5pm) Valsartan 320mg  1/2 tablets twice daily (9am & 5pm)  Previously tried:  Amlodipine - LEE at any dose above 2.5mg  daily  BP goal: 130/80 (140/90 if unable to tolerate lower goal)  Family History: no significant family history  Social History: no tobacco (despite working at [employer] for much of his career); no alcohol, no caffeine  Diet:avoid adding salt to his food Cereal for breakfast, cran/pomegranet w/ orange juice for breakfast; [employer] for lunch (avoids fried foods), small meal - some meat and hominy, likes fruit salad  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 Atherosclerosis of abdominal aorta (HCC)    CT/abd and pelvis  . Carotid artery occlusion    bilateral carotid bruit, right greater then left -bilateral 40-50% stenosis, 12/26/09- no change  . Chronic anxiety   . Coronary artery disease    s/p BM stent, prox and mid RCA, 2001, cutting ballon RCA stenosis, 2002  . Dysphagia    from esophageal dysmotility-tx with careful eating.   Marland Kitchen History of echocardiogram    2/11 echo EF 70%, mild MR, mildly elevated pulmonary pressures 42 mmHg  . History of renal angiogram    9/10, showed patent renal stents, 30-40% instent restenosis ws the most severe lesion  . Hyperlipidemia   . Left kidney mass    lower pole, observing by urology- Dr. Reece Agar  . Peripheral neuropathy    in both feet from nerve compression   . Renovascular hypertension    s./p. bilateral RA stent implant  . Subclavian artery stenosis [facility])     Current Outpatient Medications on  File Prior to Visit  Medication Sig Dispense Refill  . aspirin EC 81 MG tablet Take 1 tablet (81 mg total) by mouth daily. 90 tablet 3  . atorvastatin (LIPITOR) 40 MG tablet Take 40 mg by mouth daily.    . Calcium Carbonate-Vitamin D (CALCIUM PLUS VITAMIN D PO) Take 1 tablet by mouth daily.     . cloNIDine (CATAPRES) 0.1 MG tablet TAKE 2 TABLETS BY MOUTH EVERY DAY 180 tablet 3  . Coenzyme Q10 (CO Q-10) 100 MG CAPS Take 100 mg by mouth daily.    . finasteride (PROSCAR) 5 MG tablet Take 1 tablet by mouth daily.    . folic acid (FOLVITE) 683 MCG tablet Take 400 mcg by mouth daily.    Marland Kitchen LORazepam (ATIVAN) 1 MG tablet Take 1 mg by mouth 2 (two) times daily.     . metoprolol tartrate (LOPRESSOR) 50 MG tablet TAKE 1 TABLET  BY MOUTH TWICE A DAY 180 tablet 3  . Multiple Vitamins-Minerals (PRESERVISION AREDS 2+MULTI VIT PO) Take by mouth.    . Omega-3 Fatty Acids (FISH OIL) 1000 MG CAPS Take 1,000 mg by mouth daily.     Marland Kitchen omeprazole (PRILOSEC) 20 MG capsule Take 20 mg by mouth daily.    Vladimir Faster Glycol-Propyl Glycol 0.4-0.3 % SOLN Apply to eye at bedtime.    . tamsulosin (FLOMAX) 0.4 MG CAPS capsule Take 1 capsule by mouth daily.    Marland Kitchen triamcinolone cream (KENALOG) 0.1 % Apply 1 application topically as needed.     . valsartan (DIOVAN) 320 MG tablet TAKE 1/2 TABLET BY MOUTH 2 TIMES DAILY. PLEASE SCHEDULE AN APPT FOR FUTURE REFILLS 90 tablet 2   No current facility-administered medications on file prior to visit.    Allergies  Allergen Reactions  . Amlodipine Other (See Comments)    Tolerates 2.5 mg, higher doses cause LEE with blistering  . Erythromycin   . Penicillins   . Prednisone     Blood pressure (!) 162/74, pulse (!) 52, resp. rate 17, height 5' 9.25" (1.759 m), weight 169 lb 6.4 oz (76.8 kg), SpO2 98 %.  Essential hypertension, benign Patient with essential hypertension, good control in the mornings, but still quite elevated in the evenings.  Will have him increase the chlorthalidone to 25 mg daily (he takes at noon) and repeat metabolic panel in 2 weeks.  He should continue with regular home BP monitoring and we will see him back in the office in 6 weeks for follow up.     [name] PharmD CPP [facility] [address] [place] [address] 03/26/2021 10:14 AM

## 2021-04-08 DIAGNOSIS — I10 Essential (primary) hypertension: Secondary | ICD-10-CM | POA: Diagnosis not present

## 2021-04-08 LAB — BASIC METABOLIC PANEL
BUN/Creatinine Ratio: 23 (ref 10–24)
BUN: 40 mg/dL — ABNORMAL HIGH (ref 8–27)
CO2: 20 mmol/L (ref 20–29)
Calcium: 9.2 mg/dL (ref 8.6–10.2)
Chloride: 92 mmol/L — ABNORMAL LOW (ref 96–106)
Creatinine, Ser: 1.74 mg/dL — ABNORMAL HIGH (ref 0.76–1.27)
Glucose: 142 mg/dL — ABNORMAL HIGH (ref 65–99)
Potassium: 5 mmol/L (ref 3.5–5.2)
Sodium: 128 mmol/L — ABNORMAL LOW (ref 134–144)
eGFR: 37 mL/min/{1.73_m2} — ABNORMAL LOW (ref >59)

## 2021-04-11 ENCOUNTER — Telehealth: Payer: Self-pay

## 2021-04-11 DIAGNOSIS — R002 Palpitations: Secondary | ICD-10-CM

## 2021-04-11 DIAGNOSIS — I499 Cardiac arrhythmia, unspecified: Secondary | ICD-10-CM

## 2021-04-11 NOTE — Telephone Encounter (Signed)
Spoke with pt regarding results of wearing cardiac zio monitor for 2 weeks. Explained 380 runs of SVT, with cause for concern. Per Dr. Gwenlyn Found would like to consult EP for expert opinion for best way to move forward. Pt is slightly apprehensive about the consultation. Pt states that he feels good and recently has felt like he is having an irregular rate or rhythm like in the past. Explained to pt that this rhythm is concerning especially since he occurred frequently and at least one episode lasting for 12 minutes. Pt is open to consult. Pt verbalizes understanding.  Orders placed.

## 2021-04-15 ENCOUNTER — Encounter: Payer: Self-pay | Admitting: Cardiology

## 2021-04-15 ENCOUNTER — Ambulatory Visit: Payer: Medicare Other | Admitting: Cardiology

## 2021-04-15 ENCOUNTER — Other Ambulatory Visit: Payer: Self-pay

## 2021-04-15 VITALS — BP 148/84 | HR 51 | Ht 70.0 in | Wt 166.0 lb

## 2021-04-15 DIAGNOSIS — I471 Supraventricular tachycardia: Secondary | ICD-10-CM

## 2021-04-15 NOTE — Progress Notes (Signed)
Electrophysiology Office Note   Date:  04/15/2021   ID:  Edward [name], DOB 1930-07-09, MRN [id number]  PCP:  Mayra Neer, MD  Cardiologist:  Gwenlyn Found Primary Electrophysiologist:  Jaidyn Kuhl Meredith Leeds, MD    Chief Complaint: SVT   History of Present Illness: Edward [name] is a 87 y.o. male who is being seen today for the evaluation of SVT at the request of Lorretta Harp, MD. Presenting today for electrophysiology evaluation.  He has a history of aortic atherosclerosis, carotid artery disease, subclavian artery stenosis, coronary artery disease, hypertension, hyperlipidemia, renal artery stenosis.  Today when he went to go, he had an episode of syncope for unclear reasons.  He had an echo which was unrevealing.  He wore a 2-week ZIO that showed frequent supraventricular ectopy with no obvious cause.  Today, he denies symptoms of palpitations, chest pain, shortness of breath, orthopnea, PND, lower extremity edema, claudication, dizziness, presyncope, syncope, bleeding, or neurologic sequela. The patient is tolerating medications without difficulties.  He currently feels well.  When he was wearing the monitor, he had no complaints.  He has been unaware of palpitations and has been able to do all of his daily activities without restriction from arrhythmia.  He does complain of what sounds to be chronic shortness of breath and GI issues.   Past Medical History:  Diagnosis Date   Atherosclerosis of abdominal aorta (HCC)    CT/abd and pelvis   Carotid artery occlusion    bilateral carotid bruit, right greater then left -bilateral 40-50% stenosis, 12/26/09- no change   Chronic anxiety    Coronary artery disease    s/p BM stent, prox and mid RCA, 2001, cutting ballon RCA stenosis, 2002   Dysphagia    from esophageal dysmotility-tx with careful eating.    History of echocardiogram    2/11 echo EF 70%, mild MR, mildly elevated pulmonary pressures 42 mmHg   History of renal angiogram     9/10, showed patent renal stents, 30-40% instent restenosis ws the most severe lesion   Hyperlipidemia    Left kidney mass    lower pole, observing by urology- Dr. Reece Agar   Peripheral neuropathy    in both feet from nerve compression    Renovascular hypertension    s./p. bilateral RA stent implant   Subclavian artery stenosis [facility])    Past Surgical History:  Procedure Laterality Date   CARDIAC CATHETERIZATION  2001   BM stent, prox and mid RCA   cataract surgery Bilateral 04/14   COLONOSCOPY     every 10 years   left foot surgery     RENAL ARTERY STENT  08/07     Current Outpatient Medications  Medication Sig Dispense Refill   aspirin EC 81 MG tablet Take 1 tablet (81 mg total) by mouth daily. 90 tablet 3   atorvastatin (LIPITOR) 40 MG tablet Take 40 mg by mouth daily.     Calcium Carbonate-Vitamin D (CALCIUM PLUS VITAMIN D PO) Take 1 tablet by mouth daily.      chlorthalidone (HYGROTON) 25 MG tablet Take 1 tablet (25 mg total) by mouth daily. 90 tablet 3   cloNIDine (CATAPRES) 0.1 MG tablet TAKE 2 TABLETS BY MOUTH EVERY DAY 180 tablet 3   Coenzyme Q10 (CO Q-10) 100 MG CAPS Take 100 mg by mouth daily.     finasteride (PROSCAR) 5 MG tablet Take 1 tablet by mouth daily.     folic acid (FOLVITE) 765 MCG tablet Take 400 mcg  by mouth daily.     LORazepam (ATIVAN) 1 MG tablet Take 1 mg by mouth 2 (two) times daily.      metoprolol tartrate (LOPRESSOR) 50 MG tablet TAKE 1 TABLET BY MOUTH TWICE A DAY 180 tablet 3   Multiple Vitamins-Minerals (PRESERVISION AREDS 2+MULTI VIT PO) Take by mouth.     Omega-3 Fatty Acids (FISH OIL) 1000 MG CAPS Take 1,000 mg by mouth daily.      omeprazole (PRILOSEC) 20 MG capsule Take 20 mg by mouth daily.     Polyethyl Glycol-Propyl Glycol 0.4-0.3 % SOLN Apply to eye at bedtime.     tamsulosin (FLOMAX) 0.4 MG CAPS capsule Take 1 capsule by mouth daily.     triamcinolone cream (KENALOG) 0.1 % Apply 1 application topically as needed.      valsartan  (DIOVAN) 320 MG tablet TAKE 1/2 TABLET BY MOUTH 2 TIMES DAILY. PLEASE SCHEDULE AN APPT FOR FUTURE REFILLS 90 tablet 2   No current facility-administered medications for this visit.    Allergies:   Amlodipine, Erythromycin, Penicillins, and Prednisone   Social History:  The patient  reports that he has never smoked. He has never used smokeless tobacco. He reports that he does not drink alcohol and does not use drugs.   Family History:  The patient's family history includes Hypertension in his father and mother.    ROS:  Please see the history of present illness.   Otherwise, review of systems is positive for none.   All other systems are reviewed and negative.    PHYSICAL EXAM: VS:  BP (!) 148/84   Pulse (!) 51   Ht 5\' 10"  (1.778 m)   Wt 166 lb (75.3 kg)   BMI 23.82 kg/m  , BMI Body mass index is 23.82 kg/m. GEN: Well nourished, well developed, in no acute distress  HEENT: normal  Neck: no JVD, carotid bruits, or masses Cardiac: RRR; no murmurs, rubs, or gallops,no edema  Respiratory:  clear to auscultation bilaterally, normal work of breathing GI: soft, nontender, nondistended, + BS MS: no deformity or atrophy  Skin: warm and dry Neuro:  Strength and sensation are intact Psych: euthymic mood, full affect  EKG:  EKG is ordered today. Personal review of the ekg ordered shows sinus rhythm, right bundle branch block, rate 51  Recent Labs: 04/08/2021: BUN 40; Creatinine, Ser 1.74; Potassium 5.0; Sodium 128    Lipid Panel     Component Value Date/Time   CHOL 110 03/13/2014 0824   TRIG 91.0 03/13/2014 0824   HDL 40.50 03/13/2014 0824   CHOLHDL 3 03/13/2014 0824   VLDL 18.2 03/13/2014 0824   LDLCALC 51 03/13/2014 0824     Wt Readings from Last 3 Encounters:  04/15/21 166 lb (75.3 kg)  03/26/21 169 lb 6.4 oz (76.8 kg)  03/01/21 168 lb 12.8 oz (76.6 kg)      Other studies Reviewed: Additional studies/ records that were reviewed today include: TTE 01/10/21  Review of  the above records today demonstrates:   1. Very frequent PACs during acquisition.   2. Left ventricular ejection fraction, by estimation, is 65 to 70%. The  left ventricle has normal function. The left ventricle has no regional  wall motion abnormalities. Left ventricular diastolic parameters are  consistent with Grade II diastolic  dysfunction (pseudonormalization). Elevated left ventricular end-diastolic  pressure.   3. Right ventricular systolic function is normal. The right ventricular  size is normal. There is moderately elevated pulmonary artery systolic  pressure. The  estimated right ventricular systolic pressure is 84.1 mmHg.   4. Left atrial size was moderately dilated.   5. Right atrial size was moderately dilated.   6. The mitral valve is normal in structure. Moderate mitral valve  regurgitation. No evidence of mitral stenosis.   7. Tricuspid valve regurgitation is severe.   8. The aortic valve is normal in structure. Aortic valve regurgitation is  not visualized. No aortic stenosis is present.   9. The inferior vena cava is normal in size with greater than 50%  respiratory variability, suggesting right atrial pressure of 3 mmHg.   Cardiac monitor 03/28/2021 personally reviewed Patient had a min HR of 40 bpm, max HR of 190 bpm, and avg HR of 51 bpm. Predominant underlying rhythm was Sinus Rhythm. 1 run of Ventricular Tachycardia occurred lasting 4 beats with a max rate of 190 bpm (avg 138 bpm). 380 Supraventricular Tachycardia runs occurred, the run with the fastest interval lasting 5 beats with a max rate of 190 bpm, the longest lasting 12 mins 13 secs with an avg rate of 146 bpm. Isolated SVEs were frequent (7.8%, 66063). Isolated VEs were rare (<1.0%). There were no patient triggered events.  ASSESSMENT AND PLAN:  1.  SVT: Has had multiple episodes, longest 12 minutes.  His heart rate was 146 bpm during his longest episode.  In discussing with him, he was unaware of his SVT,  able do all his daily duties and was not restricted while wearing the monitor.  As his SVT episodes appear to be short-lived and he is asymptomatic, we Kleigh Hoelzer continue to monitor.  We did gust worrisome signs and symptoms and he may call back if needed.  2.  Hypertension: Mildly elevated today.  Plan per primary cardiology  3.  Coronary artery disease: No current chest pain.  4.  Peripheral arterial disease: Currently followed by Dr. Gwenlyn Found.  Case discussed with primary cardiology  Current medicines are reviewed at length with the patient today.   The patient does not have concerns regarding his medicines.  The following changes were made today:  none  Labs/ tests ordered today include:  Orders Placed This Encounter  Procedures   EKG 12-Lead     Disposition:   FU with Hamp Moreland as needed  Signed, Samayra Hebel Meredith Leeds, MD  04/15/2021 2:11 PM     [place] [address] [place] [address] [phone] (office) [phone] (fax)

## 2021-04-22 DIAGNOSIS — C44311 Basal cell carcinoma of skin of nose: Secondary | ICD-10-CM | POA: Diagnosis not present

## 2021-05-07 ENCOUNTER — Other Ambulatory Visit: Payer: Self-pay

## 2021-05-07 ENCOUNTER — Ambulatory Visit (INDEPENDENT_AMBULATORY_CARE_PROVIDER_SITE_OTHER): Payer: Medicare Other | Admitting: Pharmacist

## 2021-05-07 VITALS — BP 166/70 | HR 56 | Resp 14 | Ht 69.5 in | Wt 167.4 lb

## 2021-05-07 DIAGNOSIS — I10 Essential (primary) hypertension: Secondary | ICD-10-CM | POA: Diagnosis not present

## 2021-05-07 NOTE — Patient Instructions (Addendum)
It was nice meeting you today  We would like to keep your blood pressure to less than 130/80  Continue your chlorthalidone 25 mg once a day Continue metoprolol 50 mg twice a day Continue valsartan 320 mg (1/2 tablet) twice a day We can change your clonidine dose back to 2 tablets at 5 PM since you think it was controlling your blood pressure better  Continue to monitor your blood pressure at home  Continue watching your salt and caffeine intake  Please call with any questions!  Karren Cobble, PharmD, BCACP, [place], [place] [address] [place], [place] [address] Phone: [phone]; Fax: [phone] 05/07/2021 9:01 AM

## 2021-05-07 NOTE — Progress Notes (Signed)
Patient ID: Edward [name]                 DOB: May 11, 1930                      MRN: [id number]     HPI: Edward [name] is a 87 y.o. male referred by Dr. Gwenlyn Found to HTN clinic. PMH is significant for CAD, HTN, HLD, renal artery stenosis, and aortic atherosclerosis.   During last OV with Dr.Berry, his BP was 126/58 ,but reported elevated BP at home every evening. Noted renal stent still patent per renal US performed 02/12/2021.  At a follow up visit with Edward [name] his pressure was noted to be 196/82.  She asked that he divide the clonidine dose from 0.2 mg once daily to 0.1 mg bid.  At last visit with Edward [name], chlorthalidone was increased to 25mg  daily and patient was instrcuted to take 0.1mg  clonidine at noon and 5 pm rather than 0.2mg  at 5pm.  Patient and wife present today in good spirits.  Brought BP log:  05/07/21: 144/61 AM 05/06/21: 149/59 PM, 122/53 AM 05/05/21: 183/74 PM, 134/59 AM 05/04/21: 150/63 PM, 120/57 AM 05/03/21: 146/59 PM, 111/53 AM  Patient's evening blood pressure reading elevated compared to morning.  Believes it is higher since changing clonidine regimen.  Also reports it is higher on days when he feels stressed or anxious.  Believes he is having more urinary hesitancy since increasing chlorthalidone.  Current HTN meds: chlorthalidone 25mg  daily, clinidine 0.1 mg BID, valsartan 160mg  BID, metoprolol 50mg  BID BP goal: <130/80  Diet History: Does not add salt to food.  Does not drink coffee.  Drinks decaf sweet tea at home.     Wt Readings from Last 3 Encounters:  04/15/21 166 lb (75.3 kg)  03/26/21 169 lb 6.4 oz (76.8 kg)  03/01/21 168 lb 12.8 oz (76.6 kg)   BP Readings from Last 3 Encounters:  04/15/21 (!) 148/84  03/26/21 (!) 162/74  03/01/21 (!) 196/82   Pulse Readings from Last 3 Encounters:  04/15/21 (!) 51  03/26/21 (!) 52  03/01/21 (!) 52    Renal function: CrCl cannot be calculated (Patient's most recent lab result is older than the  maximum 21 days allowed.).  Past Medical History:  Diagnosis Date   Atherosclerosis of abdominal aorta (HCC)    CT/abd and pelvis   Carotid artery occlusion    bilateral carotid bruit, right greater then left -bilateral 40-50% stenosis, 12/26/09- no change   Chronic anxiety    Coronary artery disease    s/p BM stent, prox and mid RCA, 2001, cutting ballon RCA stenosis, 2002   Dysphagia    from esophageal dysmotility-tx with careful eating.    History of echocardiogram    2/11 echo EF 70%, mild MR, mildly elevated pulmonary pressures 42 mmHg   History of renal angiogram    9/10, showed patent renal stents, 30-40% instent restenosis ws the most severe lesion   Hyperlipidemia    Left kidney mass    lower pole, observing by urology- Dr. Reece Agar   Peripheral neuropathy    in both feet from nerve compression    Renovascular hypertension    s./p. bilateral RA stent implant   Subclavian artery stenosis (HCC)     Current Outpatient Medications on File Prior to Visit  Medication Sig Dispense Refill   aspirin EC 81 MG tablet Take 1 tablet (81 mg total) by mouth daily. 90 tablet 3  atorvastatin (LIPITOR) 40 MG tablet Take 40 mg by mouth daily.     Calcium Carbonate-Vitamin D (CALCIUM PLUS VITAMIN D PO) Take 1 tablet by mouth daily.      chlorthalidone (HYGROTON) 25 MG tablet Take 1 tablet (25 mg total) by mouth daily. 90 tablet 3   cloNIDine (CATAPRES) 0.1 MG tablet TAKE 2 TABLETS BY MOUTH EVERY DAY 180 tablet 3   Coenzyme Q10 (CO Q-10) 100 MG CAPS Take 100 mg by mouth daily.     finasteride (PROSCAR) 5 MG tablet Take 1 tablet by mouth daily.     folic acid (FOLVITE) 518 MCG tablet Take 400 mcg by mouth daily.     LORazepam (ATIVAN) 1 MG tablet Take 1 mg by mouth 2 (two) times daily.      metoprolol tartrate (LOPRESSOR) 50 MG tablet TAKE 1 TABLET BY MOUTH TWICE A DAY 180 tablet 3   Multiple Vitamins-Minerals (PRESERVISION AREDS 2+MULTI VIT PO) Take by mouth.     Omega-3 Fatty Acids  (FISH OIL) 1000 MG CAPS Take 1,000 mg by mouth daily.      omeprazole (PRILOSEC) 20 MG capsule Take 20 mg by mouth daily.     Polyethyl Glycol-Propyl Glycol 0.4-0.3 % SOLN Apply to eye at bedtime.     tamsulosin (FLOMAX) 0.4 MG CAPS capsule Take 1 capsule by mouth daily.     triamcinolone cream (KENALOG) 0.1 % Apply 1 application topically as needed.      valsartan (DIOVAN) 320 MG tablet TAKE 1/2 TABLET BY MOUTH 2 TIMES DAILY. PLEASE SCHEDULE AN APPT FOR FUTURE REFILLS 90 tablet 2   No current facility-administered medications on file prior to visit.    Allergies  Allergen Reactions   Amlodipine Other (See Comments)    Tolerates 2.5 mg, higher doses cause LEE with blistering   Erythromycin    Penicillins    Prednisone      Assessment/Plan:  1. Hypertension -  Patient BP in room today 166/70 which is above goal of <130/80.  Morning blood pressures have been at goal most days, PM readings are elevated.  Since patient believes his readings have increased since changing clonidine regimen, will switch back to 0.2mg  at 5PM due to patient preference.  Instructed patient to continue to monitor BP at home and if morning readings begin to increase, will likely need to change regimen again.  Patient voiced understanding.  Recheck in 6 weeks.  Recommended patient follow up with urologist regarding urinary hesitancy.  Continue valsartan 160mg  BID Continue metoprolol 50mg  BID Continue chlorthalidone 25mg  daily Change clonidine to 0.2mg  @ 5PM Recheck in 6 weeks  Karren Cobble, PharmD, BCACP, [place], [place] [address], [place], [place] [address] Phone: [phone]; Fax: [phone] 05/07/2021 9:25 AM

## 2021-06-06 DIAGNOSIS — H353131 Nonexudative age-related macular degeneration, bilateral, early dry stage: Secondary | ICD-10-CM | POA: Diagnosis not present

## 2021-06-06 DIAGNOSIS — H3530 Unspecified macular degeneration: Secondary | ICD-10-CM | POA: Diagnosis not present

## 2021-06-18 ENCOUNTER — Other Ambulatory Visit: Payer: Self-pay

## 2021-06-18 ENCOUNTER — Ambulatory Visit (INDEPENDENT_AMBULATORY_CARE_PROVIDER_SITE_OTHER): Payer: Medicare Other | Admitting: Pharmacist Clinician (PhC)/ Clinical Pharmacy Specialist

## 2021-06-18 DIAGNOSIS — I10 Essential (primary) hypertension: Secondary | ICD-10-CM | POA: Diagnosis not present

## 2021-06-18 NOTE — Progress Notes (Signed)
Patient ID: RAHEIM KOSMATKA                 DOB: 1930/01/29                      MRN: [id number]     HPI: Edward [name] is a 87 y.o. male referred by Dr. Gwenlyn Found to HTN clinic. PMH hypertension, hyperlipidemia, renal artery stenting in 06/18/2006, bilateral subclavian artery disease, and moderate pulmonary hypertension. During OV with Dr.Berry in March his BP was 126/58, but he reported elevated BP at home every evening. Noted renal stent still patent per renal US performed 02/12/2021.  We have bee following him for several months now and regardless of medication changes and timing, he continues to have elevated readings in the evenings.    Today he returns for follow up.  States his wife now has dementia, and although she is still able to do some cooking, he is taking on more responsibility for her medications and care.  He is feeling well overall, would like to gain a few pounds, but has no concerns.    Current HTN meds:  Chlorthalidone '25mg'$  daily - (lunch time)  clonidine 0.2 mg bid (5 pm) Metoprolol tartrate '50mg'$  twice daily (9am and 5pm) Valsartan '320mg'$  1/2 tablets twice daily (9am & 5pm)  Previously tried:  Amlodipine - LEE at any dose above 2.'5mg'$  daily  BP goal: 130/80 (140/90 if unable to tolerate lower goal)  Family History: no significant family history   Social History: no tobacco (despite working at [employer] for much of his career); no alcohol, no caffeine   Diet: avoid adding salt to his food Cereal for breakfast, cran/pomegranet w/ orange juice for breakfast; [employer] for lunch (avoids fried foods), small meal - some meat and hominy, likes fruit salad   Exercise: 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), does edging with weed-eater  Home BP readings:  16 am readings average 122/59, (previous visits 122/56, 128/58 and 144/64) 15 pm readings average 154/63, (previous visits 157/64, 166/68 and 171/68   Wt Readings from Last 3 Encounters:  06/18/21 166 lb  9.6 oz (75.6 kg)  05/07/21 167 lb 6.4 oz (75.9 kg)  04/15/21 166 lb (75.3 kg)   BP Readings from Last 3 Encounters:  05/07/21 (!) 166/70  04/15/21 (!) 148/84  03/26/21 (!) 162/74   Pulse Readings from Last 3 Encounters:  05/07/21 (!) 56  04/15/21 (!) 51  03/26/21 (!) 52    Past Medical History:  Diagnosis Date   Atherosclerosis of abdominal aorta (HCC)    CT/abd and pelvis   Carotid artery occlusion    bilateral carotid bruit, right greater then left -bilateral 40-50% stenosis, 12/26/09- no change   Chronic anxiety    Coronary artery disease    s/p BM stent, prox and mid RCA, 2001, cutting ballon RCA stenosis, 2002   Dysphagia    from esophageal dysmotility-tx with careful eating.    History of echocardiogram    2/11 echo EF 70%, mild MR, mildly elevated pulmonary pressures 42 mmHg   History of renal angiogram    9/10, showed patent renal stents, 30-40% instent restenosis ws the most severe lesion   Hyperlipidemia    Left kidney mass    lower pole, observing by urology- Dr. Reece Agar   Peripheral neuropathy    in both feet from nerve compression    Renovascular hypertension    s./p. bilateral RA stent implant   Subclavian  artery stenosis (HCC)     Current Outpatient Medications on File Prior to Visit  Medication Sig Dispense Refill   aspirin EC 81 MG tablet Take 1 tablet (81 mg total) by mouth daily. 90 tablet 3   atorvastatin (LIPITOR) 40 MG tablet Take 40 mg by mouth daily.     Calcium Carbonate-Vitamin D (CALCIUM PLUS VITAMIN D PO) Take 1 tablet by mouth daily.      chlorthalidone (HYGROTON) 25 MG tablet Take 1 tablet (25 mg total) by mouth daily. 90 tablet 3   cloNIDine (CATAPRES) 0.1 MG tablet TAKE 2 TABLETS BY MOUTH EVERY DAY 180 tablet 3   Coenzyme Q10 (CO Q-10) 100 MG CAPS Take 100 mg by mouth daily.     finasteride (PROSCAR) 5 MG tablet Take 1 tablet by mouth daily.     folic acid (FOLVITE) A999333 MCG tablet Take 400 mcg by mouth daily.     LORazepam (ATIVAN) 1  MG tablet Take 1 mg by mouth 2 (two) times daily.      metoprolol tartrate (LOPRESSOR) 50 MG tablet TAKE 1 TABLET BY MOUTH TWICE A DAY 180 tablet 3   Multiple Vitamins-Minerals (PRESERVISION AREDS 2+MULTI VIT PO) Take by mouth.     Omega-3 Fatty Acids (FISH OIL) 1000 MG CAPS Take 1,000 mg by mouth daily.      omeprazole (PRILOSEC) 20 MG capsule Take 20 mg by mouth daily.     Polyethyl Glycol-Propyl Glycol 0.4-0.3 % SOLN Apply to eye at bedtime.     tamsulosin (FLOMAX) 0.4 MG CAPS capsule Take 1 capsule by mouth daily.     triamcinolone cream (KENALOG) 0.1 % Apply 1 application topically as needed.      valsartan (DIOVAN) 320 MG tablet TAKE 1/2 TABLET BY MOUTH 2 TIMES DAILY. PLEASE SCHEDULE AN APPT FOR FUTURE REFILLS 90 tablet 2   No current facility-administered medications on file prior to visit.    Allergies  Allergen Reactions   Amlodipine Other (See Comments)    Tolerates 2.5 mg, higher doses cause LEE with blistering   Erythromycin    Penicillins    Prednisone     Height 5' 9.5" (1.765 m), weight 166 lb 9.6 oz (75.6 kg).  Essential hypertension, benign Patient with essential hypertension, continues to have excellent morning readings with some elevation as the day goes on.  Evening average has dropped over the past 4-5 months from 171/68 to 154/63.  Changes in timing of medications have had only a small impact on decreasing these higher readings.  Suspect is more related to stress and activity at home.  His wife has dementia and he is primary caregiver.  He is tolerating current medications without issue, and with his advanced age, want to be sure we don't overmedicate and cause concern for hypotension.  He is agreeable to continue with current regimen and is scheduled to see Dr. Gwenlyn Found in about 6 weeks.     [name] PharmD CPP [facility] [address] [place] [address] 85/16/2022 9:41 AM

## 2021-06-18 NOTE — Patient Instructions (Signed)
Return for a a follow up appointment with Dr. Gwenlyn Found in September  Check your blood pressure at home daily and keep record of the readings.  Take your BP meds as follows:  Continue with your current medications  Bring all of your meds, your BP cuff and your record of home blood pressures to your next appointment.  Exercise as you're able, try to walk approximately 30 minutes per day.  Keep salt intake to a minimum, especially watch canned and prepared boxed foods.  Eat more fresh fruits and vegetables and fewer canned items.  Avoid eating in fast food restaurants.    HOW TO TAKE YOUR BLOOD PRESSURE: Rest 5 minutes before taking your blood pressure.  Don't smoke or drink caffeinated beverages for at least 30 minutes before. Take your blood pressure before (not after) you eat. Sit comfortably with your back supported and both feet on the floor (don't cross your legs). Elevate your arm to heart level on a table or a desk. Use the proper sized cuff. It should fit smoothly and snugly around your bare upper arm. There should be enough room to slip a fingertip under the cuff. The bottom edge of the cuff should be 1 inch above the crease of the elbow. Ideally, take 3 measurements at one sitting and record the average.

## 2021-06-18 NOTE — Assessment & Plan Note (Signed)
Patient with essential hypertension, continues to have excellent morning readings with some elevation as the day goes on.  Evening average has dropped over the past 4-5 months from 171/68 to 154/63.  Changes in timing of medications have had only a small impact on decreasing these higher readings.  Suspect is more related to stress and activity at home.  His wife has dementia and he is primary caregiver.  He is tolerating current medications without issue, and with his advanced age, want to be sure we don't overmedicate and cause concern for hypotension.  He is agreeable to continue with current regimen and is scheduled to see Dr. Gwenlyn Found in about 6 weeks.

## 2021-08-02 ENCOUNTER — Encounter: Payer: Self-pay | Admitting: Cardiovascular Disease

## 2021-08-02 ENCOUNTER — Other Ambulatory Visit: Payer: Self-pay

## 2021-08-02 ENCOUNTER — Ambulatory Visit: Payer: Medicare Other | Admitting: Cardiovascular Disease

## 2021-08-02 DIAGNOSIS — I2510 Atherosclerotic heart disease of native coronary artery without angina pectoris: Secondary | ICD-10-CM

## 2021-08-02 DIAGNOSIS — E782 Mixed hyperlipidemia: Secondary | ICD-10-CM

## 2021-08-02 DIAGNOSIS — I6523 Occlusion and stenosis of bilateral carotid arteries: Secondary | ICD-10-CM | POA: Diagnosis not present

## 2021-08-02 DIAGNOSIS — I10 Essential (primary) hypertension: Secondary | ICD-10-CM | POA: Diagnosis not present

## 2021-08-02 DIAGNOSIS — I771 Stricture of artery: Secondary | ICD-10-CM

## 2021-08-02 DIAGNOSIS — R002 Palpitations: Secondary | ICD-10-CM

## 2021-08-02 DIAGNOSIS — I701 Atherosclerosis of renal artery: Secondary | ICD-10-CM | POA: Diagnosis not present

## 2021-08-02 NOTE — Patient Instructions (Signed)
Medication Instructions:  Your physician recommends that you continue on your current medications as directed. Please refer to the Current Medication list given to you today.  *If you need a refill on your cardiac medications before your next appointment, please call your pharmacy*   Follow-Up: At [facility], you and your health needs are our priority.  As part of our continuing mission to provide you with exceptional heart care, we have created designated Provider Care Teams.  These Care Teams include your primary Cardiologist (physician) and Advanced Practice Providers (APPs -  Physician Assistants and Nurse Practitioners) who all work together to provide you with the care you need, when you need it.  We recommend signing up for the patient portal called "MyChart".  Sign up information is provided on this After Visit Summary.  MyChart is used to connect with patients for Virtual Visits (Telemedicine).  Patients are able to view lab/test results, encounter notes, upcoming appointments, etc.  Non-urgent messages can be sent to your provider as well.   To learn more about what you can do with MyChart, go to [email or web address].    Your next appointment:   6 month(s)  The format for your next appointment:   In Person  Provider:   You will see one of the following Advanced Practice Providers on your designated Care Team:   Sande Rives, PA-C Coletta Memos, FNP  Then, Dr. Quay Burow will plan to see you again in 12 month(s).

## 2021-08-02 NOTE — Assessment & Plan Note (Signed)
History of carotid artery disease with bilateral ICA stenosis by duplex ultrasound 11/14/2020 along with bilateral subclavian artery stenosis.  He does not complain of upper extremity claudication or subclavian steal symptoms at this time.

## 2021-08-02 NOTE — Assessment & Plan Note (Signed)
History of CAD status post myocardial infarction 08/20/2000.  He had 2 NIR bare-metal stents placed in his RCA by Dr. Quay Burow.  He denies chest pain or shortness of breath.

## 2021-08-02 NOTE — Progress Notes (Signed)
08/02/2021 SMAYAN HACKBART   20-Nov-1929  [id number]  Primary Physician Mayra Neer, MD Primary Cardiologist: Lorretta Harp MD FACP, [place], [place], [place]  HPI:  Edward [name] is a 87 y.o.  married Caucasian male father of 2 children, grandfather of 5 grandchildren who worked at [employer]  in the past. I last saw him in the office 02/08/2021.  His wife Edward [name] is also a patient of mine who unfortunately has recent been diagnosed with Alzheimer's.  She accompanies him today.  His primary care physician is Dr. Serita Grammes. He was referred by Dr. Casandra Doffing for peripheral vascular evaluation but has expressed a desire to follow up with me for his cardiology care as well in the future. He has a history of treated hypertension and hyperlipidemia. He has never smoked. Her father did have A. Myocardial infarction in his 47s. He had an myocardial function in October of 2001 and had 2 NIR bare metal stents placed in his RCA by Dr. Quay Burow 08/20/00. He has had renal artery stenting bilaterally performed by Dr. Rolm Baptise 06/18/06. He had carotid Dopplers performed recently that showed moderate bilateral internal carotid artery stenosis with incidentally noted bilateral subclavian artery disease. His vertebral antegrade on the left and apparent on the right. There was a 40-50 mm pressure differential with the right arm being less than the left.  He does measure his blood pressure twice a day and has noticed evening hypertension.    He has lost hearing in his left ear on 05/08/2018 for unclear reasons.  Otherwise, he denies chest pain or shortness of breath.  He has had a UTI as well treated with ciprofloxacin seen in the ER because of this.  His major complaints are palpitations however.     He has had a witnessed syncopal episode for unclear reasons.  A 2D echo was unrevealing with normal LV systolic function and grade 2 diastolic dysfunction.  He had moderate pulmonary hypertension.  There is no evidence  of valvular heart disease.  A 2-week Zio patch was ordered as well and was mailed in earlier this week.  He did have carotid Dopplers performed in January that showed moderate bilateral ICA stenosis with bilateral subclavian artery stenosis.  There was retrograde right vertebral filling with a 70 mm upper extremity blood pressure differential right lower than left.  Since I saw him 6 months ago he continues to do well.  Unfortunately his wife Edward [name] continues to deteriorate with regards to short-term memory and dementia.  He is fairly active.  He denies chest pain or shortness of breath.  He has had no recurrent syncopal episodes.     Current Meds  Medication Sig   aspirin EC 81 MG tablet Take 1 tablet (81 mg total) by mouth daily.   atorvastatin (LIPITOR) 40 MG tablet Take 40 mg by mouth daily.   Calcium Carbonate-Vitamin D (CALCIUM PLUS VITAMIN D PO) Take 1 tablet by mouth daily.    chlorthalidone (HYGROTON) 25 MG tablet Take 1 tablet (25 mg total) by mouth daily.   cloNIDine (CATAPRES) 0.1 MG tablet TAKE 2 TABLETS BY MOUTH EVERY DAY   Coenzyme Q10 (CO Q-10) 100 MG CAPS Take 100 mg by mouth daily.   finasteride (PROSCAR) 5 MG tablet Take 1 tablet by mouth daily.   folic acid (FOLVITE) 270 MCG tablet Take 400 mcg by mouth daily.   LORazepam (ATIVAN) 1 MG tablet Take 1 mg by mouth 2 (two) times daily.  metoprolol tartrate (LOPRESSOR) 50 MG tablet TAKE 1 TABLET BY MOUTH TWICE A DAY   Multiple Vitamins-Minerals (PRESERVISION AREDS 2+MULTI VIT PO) Take by mouth.   Omega-3 Fatty Acids (FISH OIL) 1000 MG CAPS Take 1,000 mg by mouth daily.    omeprazole (PRILOSEC) 20 MG capsule Take 20 mg by mouth daily.   Polyethyl Glycol-Propyl Glycol 0.4-0.3 % SOLN Apply to eye at bedtime.   tamsulosin (FLOMAX) 0.4 MG CAPS capsule Take 1 capsule by mouth daily.   triamcinolone cream (KENALOG) 0.1 % Apply 1 application topically as needed.    valsartan (DIOVAN) 320 MG tablet TAKE 1/2 TABLET BY MOUTH 2 TIMES DAILY.  PLEASE SCHEDULE AN APPT FOR FUTURE REFILLS     Allergies  Allergen Reactions   Amlodipine Other (See Comments)    Tolerates 2.5 mg, higher doses cause LEE with blistering   Erythromycin    Penicillins    Prednisone     Social History   Socioeconomic History   Marital status: Married    Spouse name: Not on file   Number of children: Not on file   Years of education: Not on file   Highest education level: Not on file  Occupational History   Not on file  Tobacco Use   Smoking status: Never   Smokeless tobacco: Never  Vaping Use   Vaping Use: Never used  Substance and Sexual Activity   Alcohol use: No   Drug use: No   Sexual activity: Not on file  Other Topics Concern   Not on file  Social History Narrative   Not on file   Social Determinants of Health   Financial Resource Strain: Not on file  Food Insecurity: Not on file  Transportation Needs: Not on file  Physical Activity: Not on file  Stress: Not on file  Social Connections: Not on file  Intimate Partner Violence: Not on file     Review of Systems: General: negative for chills, fever, night sweats or weight changes.  Cardiovascular: negative for chest pain, dyspnea on exertion, edema, orthopnea, palpitations, paroxysmal nocturnal dyspnea or shortness of breath Dermatological: negative for rash Respiratory: negative for cough or wheezing Urologic: negative for hematuria Abdominal: negative for nausea, vomiting, diarrhea, bright red blood per rectum, melena, or hematemesis Neurologic: negative for visual changes, syncope, or dizziness All other systems reviewed and are otherwise negative except as noted above.    Blood pressure (!) 162/62, pulse 74, height 5' 9.5" (1.765 m), weight 168 lb 3.2 oz (76.3 kg), SpO2 98 %.  General appearance: alert and no distress Neck: no adenopathy, no JVD, supple, symmetrical, trachea midline, thyroid not enlarged, symmetric, no tenderness/mass/nodules, and bilateral carotid  and subclavian bruits Lungs: clear to auscultation bilaterally Heart: regular rate and rhythm, S1, S2 normal, no murmur, click, rub or gallop Extremities: extremities normal, atraumatic, no cyanosis or edema Pulses: 2+ and symmetric Skin: Skin color, texture, turgor normal. No rashes or lesions Neurologic: Grossly normal  EKG not performed today  ASSESSMENT AND PLAN:   Coronary atherosclerosis of native coronary artery History of CAD status post myocardial infarction 08/20/2000.  He had 2 NIR bare-metal stents placed in his RCA by Dr. Quay Burow.  He denies chest pain or shortness of breath.  Mixed hyperlipidemia History of hyperlipidemia on atorvastatin with lipid profile performed 02/01/2021 revealing total cholesterol 142, LDL 66 and HDL 46.  Carotid artery stenosis History of carotid artery disease with bilateral ICA stenosis by duplex ultrasound 11/14/2020 along with bilateral subclavian artery stenosis.  He does not complain of upper extremity claudication or subclavian steal symptoms at this time.  Essential hypertension, benign History of essential hypertension blood pressure measured today at 162/62.  He says he checks his blood pressure at home and it usually somewhat lower.  He is on chlorthalidone, metoprolol and valsartan.  Renal artery stenosis (HCC) History of bilateral renal artery stenosis status post stenting by Dr. Rolm Baptise 06/18/2006.  His most recent renal Doppler studies performed 02/12/2021 revealed widely patent renal artery stents with preserved renal dimensions and renal aortic ratios of 2.3 on the right and 1.9 on the left.  Palpitations History of palpitations in the past with an event monitor performed 04/11/2021 that showed PACs, PVCs and runs of SVT.  Subclavian artery stenosis (HCC) History of bilateral subclavian artery stenosis with an 60 to 80 mm blood pressure differential in the upper extremities right less than left with disturbed vertebral flow.  He  denies symptoms of upper extremity claudication or subclavian steal.     Lorretta Harp MD [facility], [facility] 08/02/2021 9:30 AM

## 2021-08-02 NOTE — Assessment & Plan Note (Signed)
History of bilateral renal artery stenosis status post stenting by Dr. Rolm Baptise 06/18/2006.  His most recent renal Doppler studies performed 02/12/2021 revealed widely patent renal artery stents with preserved renal dimensions and renal aortic ratios of 2.3 on the right and 1.9 on the left.

## 2021-08-02 NOTE — Assessment & Plan Note (Signed)
History of palpitations in the past with an event monitor performed 04/11/2021 that showed PACs, PVCs and runs of SVT.

## 2021-08-02 NOTE — Assessment & Plan Note (Signed)
History of essential hypertension blood pressure measured today at 162/62.  He says he checks his blood pressure at home and it usually somewhat lower.  He is on chlorthalidone, metoprolol and valsartan.

## 2021-08-02 NOTE — Assessment & Plan Note (Signed)
History of hyperlipidemia on atorvastatin with lipid profile performed 02/01/2021 revealing total cholesterol 142, LDL 66 and HDL 46.

## 2021-08-02 NOTE — Assessment & Plan Note (Signed)
History of bilateral subclavian artery stenosis with an 60 to 80 mm blood pressure differential in the upper extremities right less than left with disturbed vertebral flow.  He denies symptoms of upper extremity claudication or subclavian steal.

## 2021-08-14 ENCOUNTER — Telehealth: Payer: Self-pay | Admitting: Cardiovascular Disease

## 2021-08-14 NOTE — Telephone Encounter (Signed)
*  STAT* If patient is at the pharmacy, call can be transferred to refill team.   1. Which medications need to be refilled? (please list name of each medication and dose if known) cloNIDine (CATAPRES) 0.1 MG tablet  2. Which pharmacy/location (including street and city if local pharmacy) is medication to be sent to?[id number] - [place], [place] - [address].  3. Do they need a 30 day or 90 day supply? 90 day

## 2021-08-16 ENCOUNTER — Other Ambulatory Visit (HOSPITAL_BASED_OUTPATIENT_CLINIC_OR_DEPARTMENT_OTHER): Payer: Self-pay | Admitting: Cardiovascular Disease

## 2021-08-16 DIAGNOSIS — I700 Atherosclerosis of aorta: Secondary | ICD-10-CM | POA: Diagnosis not present

## 2021-08-16 DIAGNOSIS — I150 Renovascular hypertension: Secondary | ICD-10-CM | POA: Diagnosis not present

## 2021-08-16 DIAGNOSIS — N183 Chronic kidney disease, stage 3 unspecified: Secondary | ICD-10-CM | POA: Diagnosis not present

## 2021-08-16 DIAGNOSIS — Z23 Encounter for immunization: Secondary | ICD-10-CM | POA: Diagnosis not present

## 2021-08-16 DIAGNOSIS — E782 Mixed hyperlipidemia: Secondary | ICD-10-CM | POA: Diagnosis not present

## 2021-08-16 DIAGNOSIS — G458 Other transient cerebral ischemic attacks and related syndromes: Secondary | ICD-10-CM

## 2021-08-16 DIAGNOSIS — I701 Atherosclerosis of renal artery: Secondary | ICD-10-CM | POA: Diagnosis not present

## 2021-08-16 DIAGNOSIS — I25119 Atherosclerotic heart disease of native coronary artery with unspecified angina pectoris: Secondary | ICD-10-CM | POA: Diagnosis not present

## 2021-08-16 DIAGNOSIS — Z0000 Encounter for general adult medical examination without abnormal findings: Secondary | ICD-10-CM | POA: Diagnosis not present

## 2021-08-16 DIAGNOSIS — G629 Polyneuropathy, unspecified: Secondary | ICD-10-CM | POA: Diagnosis not present

## 2021-08-16 DIAGNOSIS — I6523 Occlusion and stenosis of bilateral carotid arteries: Secondary | ICD-10-CM

## 2021-08-16 DIAGNOSIS — G4700 Insomnia, unspecified: Secondary | ICD-10-CM | POA: Diagnosis not present

## 2021-08-16 DIAGNOSIS — I779 Disorder of arteries and arterioles, unspecified: Secondary | ICD-10-CM | POA: Diagnosis not present

## 2021-09-30 ENCOUNTER — Telehealth: Payer: Self-pay | Admitting: Cardiovascular Disease

## 2021-09-30 NOTE — Telephone Encounter (Signed)
Called patient, he states that since November 19th he has started having a racing heart rate- that he can feel. He can hear it in his ears, he denies SOB, denies chest pains, but states he is fatigued (sleeping a lot), and is just feeling "unwell". He states that he has had some coughing episodes that has caused him to get sick, and he has not been sleeping good the last few nights. I did advise at times coughing and not sleeping can increase symptoms. Looks like previous history has been having SVT episodes. Patient states that anytime his HR gets over 100 he can be walking and feel lightheaded. He can have these episodes at anytime (sitting, standing) he states he finally did get something for his cough, and was able to rest last night- I did advise to continue to monitor these episodes and if they occur to let us know, and monitor HR. Patient aware I will let Dr.Berry know- would you like me to send this to [facility] as well- was seen in June due to SVT seen on monitor, but patient was not having symptoms, he was feeling well at this visit.   Patient aware I will call back with recommendations.

## 2021-09-30 NOTE — Telephone Encounter (Signed)
STAT if HR is under 50 or over 120 (normal HR is 60-100 beats per minute)  What is your heart rate? This have been going on for 97, 94, 96, 123, 110, 112, this morning it is 106  Do you have a log of your heart rate readings (document readings)? yes  Do you have any other symptoms?  No sleep for 2 night, coughing, digestive tract tore u patient wants to be seen asap

## 2021-10-01 DIAGNOSIS — I25119 Atherosclerotic heart disease of native coronary artery with unspecified angina pectoris: Secondary | ICD-10-CM | POA: Diagnosis not present

## 2021-10-01 DIAGNOSIS — Z03818 Encounter for observation for suspected exposure to other biological agents ruled out: Secondary | ICD-10-CM | POA: Diagnosis not present

## 2021-10-01 DIAGNOSIS — R509 Fever, unspecified: Secondary | ICD-10-CM | POA: Diagnosis not present

## 2021-10-01 DIAGNOSIS — I150 Renovascular hypertension: Secondary | ICD-10-CM | POA: Diagnosis not present

## 2021-10-01 DIAGNOSIS — J069 Acute upper respiratory infection, unspecified: Secondary | ICD-10-CM | POA: Diagnosis not present

## 2021-10-01 DIAGNOSIS — J40 Bronchitis, not specified as acute or chronic: Secondary | ICD-10-CM | POA: Diagnosis not present

## 2021-10-01 DIAGNOSIS — N1832 Chronic kidney disease, stage 3b: Secondary | ICD-10-CM | POA: Diagnosis not present

## 2021-10-09 ENCOUNTER — Other Ambulatory Visit: Payer: Self-pay

## 2021-10-09 ENCOUNTER — Emergency Department (HOSPITAL_COMMUNITY): Payer: Medicare Other

## 2021-10-09 ENCOUNTER — Inpatient Hospital Stay (HOSPITAL_COMMUNITY)
Admission: EM | Admit: 2021-10-09 | Discharge: 2021-10-13 | DRG: 641 | Disposition: A | Discharge status: Home / Self Care | Payer: Medicare Other | Attending: Internal Medicine | Admitting: Internal Medicine

## 2021-10-09 ENCOUNTER — Encounter (HOSPITAL_COMMUNITY): Payer: Self-pay | Admitting: Oncology

## 2021-10-09 DIAGNOSIS — N400 Enlarged prostate without lower urinary tract symptoms: Secondary | ICD-10-CM | POA: Diagnosis present

## 2021-10-09 DIAGNOSIS — E861 Hypovolemia: Secondary | ICD-10-CM | POA: Diagnosis not present

## 2021-10-09 DIAGNOSIS — N179 Acute kidney failure, unspecified: Secondary | ICD-10-CM | POA: Diagnosis present

## 2021-10-09 DIAGNOSIS — Z8249 Family history of ischemic heart disease and other diseases of the circulatory system: Secondary | ICD-10-CM | POA: Diagnosis not present

## 2021-10-09 DIAGNOSIS — E860 Dehydration: Secondary | ICD-10-CM | POA: Diagnosis not present

## 2021-10-09 DIAGNOSIS — J101 Influenza due to other identified influenza virus with other respiratory manifestations: Secondary | ICD-10-CM | POA: Diagnosis present

## 2021-10-09 DIAGNOSIS — R0602 Shortness of breath: Secondary | ICD-10-CM

## 2021-10-09 DIAGNOSIS — I4819 Other persistent atrial fibrillation: Secondary | ICD-10-CM | POA: Diagnosis not present

## 2021-10-09 DIAGNOSIS — I471 Supraventricular tachycardia: Secondary | ICD-10-CM | POA: Diagnosis not present

## 2021-10-09 DIAGNOSIS — Z955 Presence of coronary angioplasty implant and graft: Secondary | ICD-10-CM

## 2021-10-09 DIAGNOSIS — Y929 Unspecified place or not applicable: Secondary | ICD-10-CM

## 2021-10-09 DIAGNOSIS — R9431 Abnormal electrocardiogram [ECG] [EKG]: Secondary | ICD-10-CM | POA: Diagnosis not present

## 2021-10-09 DIAGNOSIS — I771 Stricture of artery: Secondary | ICD-10-CM | POA: Diagnosis present

## 2021-10-09 DIAGNOSIS — N1832 Chronic kidney disease, stage 3b: Secondary | ICD-10-CM | POA: Diagnosis not present

## 2021-10-09 DIAGNOSIS — E1122 Type 2 diabetes mellitus with diabetic chronic kidney disease: Secondary | ICD-10-CM | POA: Diagnosis not present

## 2021-10-09 DIAGNOSIS — R6889 Other general symptoms and signs: Secondary | ICD-10-CM | POA: Diagnosis not present

## 2021-10-09 DIAGNOSIS — I2510 Atherosclerotic heart disease of native coronary artery without angina pectoris: Secondary | ICD-10-CM | POA: Diagnosis present

## 2021-10-09 DIAGNOSIS — R112 Nausea with vomiting, unspecified: Secondary | ICD-10-CM | POA: Diagnosis not present

## 2021-10-09 DIAGNOSIS — Z880 Allergy status to penicillin: Secondary | ICD-10-CM

## 2021-10-09 DIAGNOSIS — I10 Essential (primary) hypertension: Secondary | ICD-10-CM | POA: Diagnosis present

## 2021-10-09 DIAGNOSIS — F419 Anxiety disorder, unspecified: Secondary | ICD-10-CM | POA: Diagnosis present

## 2021-10-09 DIAGNOSIS — Z888 Allergy status to other drugs, medicaments and biological substances status: Secondary | ICD-10-CM

## 2021-10-09 DIAGNOSIS — I701 Atherosclerosis of renal artery: Secondary | ICD-10-CM | POA: Diagnosis not present

## 2021-10-09 DIAGNOSIS — I708 Atherosclerosis of other arteries: Secondary | ICD-10-CM | POA: Diagnosis present

## 2021-10-09 DIAGNOSIS — K219 Gastro-esophageal reflux disease without esophagitis: Secondary | ICD-10-CM | POA: Diagnosis not present

## 2021-10-09 DIAGNOSIS — I129 Hypertensive chronic kidney disease with stage 1 through stage 4 chronic kidney disease, or unspecified chronic kidney disease: Secondary | ICD-10-CM | POA: Diagnosis present

## 2021-10-09 DIAGNOSIS — Z7982 Long term (current) use of aspirin: Secondary | ICD-10-CM

## 2021-10-09 DIAGNOSIS — I4891 Unspecified atrial fibrillation: Secondary | ICD-10-CM | POA: Diagnosis not present

## 2021-10-09 DIAGNOSIS — J9811 Atelectasis: Secondary | ICD-10-CM | POA: Diagnosis not present

## 2021-10-09 DIAGNOSIS — E782 Mixed hyperlipidemia: Secondary | ICD-10-CM | POA: Diagnosis present

## 2021-10-09 DIAGNOSIS — Z20822 Contact with and (suspected) exposure to covid-19: Secondary | ICD-10-CM | POA: Diagnosis not present

## 2021-10-09 DIAGNOSIS — E871 Hypo-osmolality and hyponatremia: Principal | ICD-10-CM | POA: Diagnosis present

## 2021-10-09 DIAGNOSIS — T502X5A Adverse effect of carbonic-anhydrase inhibitors, benzothiadiazides and other diuretics, initial encounter: Secondary | ICD-10-CM | POA: Diagnosis present

## 2021-10-09 DIAGNOSIS — Z743 Need for continuous supervision: Secondary | ICD-10-CM | POA: Diagnosis not present

## 2021-10-09 DIAGNOSIS — R059 Cough, unspecified: Secondary | ICD-10-CM | POA: Diagnosis not present

## 2021-10-09 DIAGNOSIS — I700 Atherosclerosis of aorta: Secondary | ICD-10-CM | POA: Diagnosis present

## 2021-10-09 DIAGNOSIS — Z79899 Other long term (current) drug therapy: Secondary | ICD-10-CM

## 2021-10-09 LAB — CBC
HCT: 29.3 % — ABNORMAL LOW (ref 39.0–52.0)
Hemoglobin: 10.8 g/dL — ABNORMAL LOW (ref 13.0–17.0)
MCH: 31.8 pg (ref 26.0–34.0)
MCHC: 36.9 g/dL — ABNORMAL HIGH (ref 30.0–36.0)
MCV: 86.2 fL (ref 80.0–100.0)
Platelets: 244 10*3/uL (ref 150–400)
RBC: 3.4 MIL/uL — ABNORMAL LOW (ref 4.22–5.81)
RDW: 11.9 % (ref 11.5–15.5)
WBC: 7.2 10*3/uL (ref 4.0–10.5)
nRBC: 0 % (ref 0.0–0.2)

## 2021-10-09 LAB — CBC WITH DIFFERENTIAL/PLATELET
Abs Immature Granulocytes: 0.12 10*3/uL — ABNORMAL HIGH (ref 0.00–0.07)
Basophils Absolute: 0 10*3/uL (ref 0.0–0.1)
Basophils Relative: 0 %
Eosinophils Absolute: 0 10*3/uL (ref 0.0–0.5)
Eosinophils Relative: 0 %
HCT: 32.5 % — ABNORMAL LOW (ref 39.0–52.0)
Hemoglobin: 11.8 g/dL — ABNORMAL LOW (ref 13.0–17.0)
Immature Granulocytes: 2 %
Lymphocytes Relative: 14 %
Lymphs Abs: 1.2 10*3/uL (ref 0.7–4.0)
MCH: 31.1 pg (ref 26.0–34.0)
MCHC: 36.3 g/dL — ABNORMAL HIGH (ref 30.0–36.0)
MCV: 85.5 fL (ref 80.0–100.0)
Monocytes Absolute: 0.8 10*3/uL (ref 0.1–1.0)
Monocytes Relative: 10 %
Neutro Abs: 6.1 10*3/uL (ref 1.7–7.7)
Neutrophils Relative %: 74 %
Platelets: 245 10*3/uL (ref 150–400)
RBC: 3.8 MIL/uL — ABNORMAL LOW (ref 4.22–5.81)
RDW: 11.9 % (ref 11.5–15.5)
WBC: 8.2 10*3/uL (ref 4.0–10.5)
nRBC: 0 % (ref 0.0–0.2)

## 2021-10-09 LAB — URINALYSIS, ROUTINE W REFLEX MICROSCOPIC
Bacteria, UA: NONE SEEN
Bilirubin Urine: NEGATIVE
Glucose, UA: NEGATIVE mg/dL
Hgb urine dipstick: NEGATIVE
Ketones, ur: NEGATIVE mg/dL
Leukocytes,Ua: NEGATIVE
Nitrite: NEGATIVE
Protein, ur: 100 mg/dL — AB
Specific Gravity, Urine: 1.015 (ref 1.005–1.030)
pH: 7.5 (ref 5.0–8.0)

## 2021-10-09 LAB — COMPREHENSIVE METABOLIC PANEL
ALT: 29 U/L (ref 0–44)
AST: 30 U/L (ref 15–41)
Albumin: 3.2 g/dL — ABNORMAL LOW (ref 3.5–5.0)
Alkaline Phosphatase: 92 U/L (ref 38–126)
Anion gap: 10 (ref 5–15)
BUN: 27 mg/dL — ABNORMAL HIGH (ref 8–23)
CO2: 24 mmol/L (ref 22–32)
Calcium: 8.6 mg/dL — ABNORMAL LOW (ref 8.9–10.3)
Chloride: 84 mmol/L — ABNORMAL LOW (ref 98–111)
Creatinine, Ser: 1.29 mg/dL — ABNORMAL HIGH (ref 0.61–1.24)
GFR, Estimated: 53 mL/min — ABNORMAL LOW (ref >60)
Glucose, Bld: 100 mg/dL — ABNORMAL HIGH (ref 70–99)
Potassium: 4.1 mmol/L (ref 3.5–5.1)
Sodium: 118 mmol/L — CL (ref 135–145)
Total Bilirubin: 1 mg/dL (ref 0.3–1.2)
Total Protein: 7 g/dL (ref 6.5–8.1)

## 2021-10-09 LAB — BASIC METABOLIC PANEL
Anion gap: 9 (ref 5–15)
Anion gap: 9 (ref 5–15)
BUN: 25 mg/dL — ABNORMAL HIGH (ref 8–23)
BUN: 26 mg/dL — ABNORMAL HIGH (ref 8–23)
CO2: 24 mmol/L (ref 22–32)
CO2: 23 mmol/L (ref 22–32)
Calcium: 8.1 mg/dL — ABNORMAL LOW (ref 8.9–10.3)
Calcium: 8.1 mg/dL — ABNORMAL LOW (ref 8.9–10.3)
Chloride: 85 mmol/L — ABNORMAL LOW (ref 98–111)
Chloride: 87 mmol/L — ABNORMAL LOW (ref 98–111)
Creatinine, Ser: 1.27 mg/dL — ABNORMAL HIGH (ref 0.61–1.24)
Creatinine, Ser: 1.43 mg/dL — ABNORMAL HIGH (ref 0.61–1.24)
GFR, Estimated: 54 mL/min — ABNORMAL LOW (ref >60)
GFR, Estimated: 47 mL/min — ABNORMAL LOW (ref >60)
Glucose, Bld: 132 mg/dL — ABNORMAL HIGH (ref 70–99)
Glucose, Bld: 125 mg/dL — ABNORMAL HIGH (ref 70–99)
Potassium: 3.8 mmol/L (ref 3.5–5.1)
Potassium: 4.3 mmol/L (ref 3.5–5.1)
Sodium: 118 mmol/L — CL (ref 135–145)
Sodium: 119 mmol/L — CL (ref 135–145)

## 2021-10-09 LAB — SODIUM, URINE, RANDOM: Sodium, Ur: 90 mmol/L

## 2021-10-09 LAB — OSMOLALITY: Osmolality: 252 mOsm/kg — ABNORMAL LOW (ref 275–295)

## 2021-10-09 LAB — RESP PANEL BY RT-PCR (FLU A&B, COVID) ARPGX2
Influenza A by PCR: POSITIVE — AB
Influenza B by PCR: NEGATIVE
SARS Coronavirus 2 by RT PCR: NEGATIVE

## 2021-10-09 LAB — CBG MONITORING, ED: Glucose-Capillary: 117 mg/dL — ABNORMAL HIGH (ref 70–99)

## 2021-10-09 LAB — OSMOLALITY, URINE: Osmolality, Ur: 430 mOsm/kg (ref 300–900)

## 2021-10-09 MED ORDER — PANTOPRAZOLE SODIUM 40 MG PO TBEC
40.0000 mg | DELAYED_RELEASE_TABLET | Freq: Every day | ORAL | Status: DC
  Administered 2021-10-09 – 2021-10-13 (×5): 40 mg via ORAL
  Filled 2021-10-09 (×4): qty 1

## 2021-10-09 MED ORDER — ACETAMINOPHEN 325 MG PO TABS: 650.0000 mg | ORAL_TABLET | Freq: Four times a day (QID) | ORAL | Status: DC | PRN

## 2021-10-09 MED ORDER — ONDANSETRON HCL 4 MG/2ML IJ SOLN: 4.0000 mg | Freq: Four times a day (QID) | INTRAMUSCULAR | Status: DC | PRN

## 2021-10-09 MED ORDER — DOCUSATE SODIUM 100 MG PO CAPS
100.0000 mg | ORAL_CAPSULE | Freq: Two times a day (BID) | ORAL | Status: DC
  Administered 2021-10-09 – 2021-10-11 (×6): 100 mg via ORAL
  Filled 2021-10-09 (×6): qty 1

## 2021-10-09 MED ORDER — LORAZEPAM 1 MG PO TABS
1.0000 mg | ORAL_TABLET | Freq: Two times a day (BID) | ORAL | Status: DC
  Administered 2021-10-09 – 2021-10-13 (×8): 1 mg via ORAL
  Filled 2021-10-09 (×8): qty 1

## 2021-10-09 MED ORDER — ASPIRIN EC 81 MG PO TBEC
81.0000 mg | DELAYED_RELEASE_TABLET | Freq: Every day | ORAL | Status: DC
  Administered 2021-10-09 – 2021-10-13 (×5): 81 mg via ORAL
  Filled 2021-10-09 (×5): qty 1

## 2021-10-09 MED ORDER — ATORVASTATIN CALCIUM 40 MG PO TABS
40.0000 mg | ORAL_TABLET | Freq: Every day | ORAL | Status: DC
  Administered 2021-10-09 – 2021-10-13 (×5): 40 mg via ORAL
  Filled 2021-10-09 (×5): qty 1

## 2021-10-09 MED ORDER — TAMSULOSIN HCL 0.4 MG PO CAPS
0.4000 mg | ORAL_CAPSULE | Freq: Every day | ORAL | Status: DC
  Administered 2021-10-09 – 2021-10-13 (×5): 0.4 mg via ORAL
  Filled 2021-10-09 (×5): qty 1

## 2021-10-09 MED ORDER — ONDANSETRON HCL 4 MG/2ML IJ SOLN
4.0000 mg | Freq: Once | INTRAMUSCULAR | Status: AC
  Administered 2021-10-09: 4 mg via INTRAVENOUS
  Filled 2021-10-09: qty 2

## 2021-10-09 MED ORDER — CLONIDINE HCL 0.1 MG PO TABS
0.1000 mg | ORAL_TABLET | Freq: Two times a day (BID) | ORAL | Status: DC
  Administered 2021-10-10 – 2021-10-13 (×7): 0.1 mg via ORAL
  Filled 2021-10-09 (×8): qty 1

## 2021-10-09 MED ORDER — FINASTERIDE 5 MG PO TABS
5.0000 mg | ORAL_TABLET | Freq: Every day | ORAL | Status: DC
  Administered 2021-10-10 – 2021-10-13 (×4): 5 mg via ORAL
  Filled 2021-10-09 (×4): qty 1

## 2021-10-09 MED ORDER — GUAIFENESIN ER 600 MG PO TB12
600.0000 mg | ORAL_TABLET | Freq: Two times a day (BID) | ORAL | Status: DC
  Administered 2021-10-09 – 2021-10-13 (×9): 600 mg via ORAL
  Filled 2021-10-09 (×9): qty 1

## 2021-10-09 MED ORDER — ONDANSETRON HCL 4 MG PO TABS: 4.0000 mg | ORAL_TABLET | Freq: Four times a day (QID) | ORAL | Status: DC | PRN

## 2021-10-09 MED ORDER — ENOXAPARIN SODIUM 40 MG/0.4ML IJ SOSY
40.0000 mg | PREFILLED_SYRINGE | INTRAMUSCULAR | Status: DC
  Administered 2021-10-09: 40 mg via SUBCUTANEOUS
  Filled 2021-10-09: qty 0.4

## 2021-10-09 MED ORDER — SODIUM CHLORIDE 0.9 % IV BOLUS
500.0000 mL | Freq: Once | INTRAVENOUS | Status: AC
  Administered 2021-10-09: 500 mL via INTRAVENOUS

## 2021-10-09 MED ORDER — FOLIC ACID 1 MG PO TABS
1.0000 mg | ORAL_TABLET | Freq: Every day | ORAL | Status: DC
  Administered 2021-10-09 – 2021-10-13 (×5): 1 mg via ORAL
  Filled 2021-10-09 (×5): qty 1

## 2021-10-09 MED ORDER — ACETAMINOPHEN 650 MG RE SUPP: 650.0000 mg | Freq: Four times a day (QID) | RECTAL | Status: DC | PRN

## 2021-10-09 MED ORDER — SODIUM CHLORIDE 0.9 % IV SOLN: INTRAVENOUS | Status: DC

## 2021-10-09 MED ORDER — METOPROLOL TARTRATE 50 MG PO TABS
50.0000 mg | ORAL_TABLET | Freq: Two times a day (BID) | ORAL | Status: DC
  Administered 2021-10-10 – 2021-10-11 (×4): 50 mg via ORAL
  Filled 2021-10-09: qty 2
  Filled 2021-10-09 (×2): qty 1
  Filled 2021-10-09: qty 2
  Filled 2021-10-09: qty 1

## 2021-10-09 MED ORDER — IRBESARTAN 75 MG PO TABS: 37.5000 mg | ORAL_TABLET | Freq: Every day | ORAL | Status: DC

## 2021-10-09 MED ORDER — METOPROLOL TARTRATE 25 MG PO TABS
50.0000 mg | ORAL_TABLET | Freq: Once | ORAL | Status: AC
  Administered 2021-10-09: 50 mg via ORAL
  Filled 2021-10-09: qty 2

## 2021-10-09 MED ORDER — CLONIDINE HCL 0.1 MG PO TABS
0.2000 mg | ORAL_TABLET | Freq: Once | ORAL | Status: AC
  Administered 2021-10-09: 0.2 mg via ORAL
  Filled 2021-10-09: qty 2

## 2021-10-09 NOTE — ED Notes (Signed)
Sodium 119. Dr. Dwyane Dee informed via epic secure chat.

## 2021-10-09 NOTE — ED Notes (Addendum)
Pt in lobby. 

## 2021-10-09 NOTE — ED Triage Notes (Signed)
Pt bib PTAR from home d/t congestion, N/V poor po intake x 5 days. Wife w/ alzheimer's at bedside as there was no one at the home to care for her.  Daughter en route from [place].

## 2021-10-09 NOTE — ED Notes (Signed)
Pt able to ambulate to restroom with standby assist 

## 2021-10-09 NOTE — ED Provider Notes (Signed)
[facility] DEPT Provider Note   CSN: [id number] Arrival date & time: 10/09/21  4097     History Chief Complaint  Patient presents with   Flu Like Sx    Edward [name] is a 87 y.o. male.  HPI 87 year old male presents with vomiting.  Patient has been dealing with flu and bronchitis symptoms since 11/25.  He was diagnosed with the flu last week by his PCP but he was out of the window to get Tamiflu.  He continues to have a cough that is actually improving with supportive care such as Mucinex.  He denies any shortness of breath or chest pain.  He had a fever 1 day but none since.  However last night he developed vomiting and he is not had his great of p.o. intake.  He has not vomited today but has not tried to eat or drink.  He has not taken his meds. No abdominal pain or urinary symptoms.   Past Medical History:  Diagnosis Date   Atherosclerosis of abdominal aorta (HCC)    CT/abd and pelvis   Carotid artery occlusion    bilateral carotid bruit, right greater then left -bilateral 40-50% stenosis, 12/26/09- no change   Chronic anxiety    Coronary artery disease    s/p BM stent, prox and mid RCA, 2001, cutting ballon RCA stenosis, 2002   Dysphagia    from esophageal dysmotility-tx with careful eating.    History of echocardiogram    2/11 echo EF 70%, mild MR, mildly elevated pulmonary pressures 42 mmHg   History of renal angiogram    9/10, showed patent renal stents, 30-40% instent restenosis ws the most severe lesion   Hyperlipidemia    Left kidney mass    lower pole, observing by urology- Dr. Reece Agar   Peripheral neuropathy    in both feet from nerve compression    Renovascular hypertension    s./p. bilateral RA stent implant   Subclavian artery stenosis [facility])     Patient Active Problem List   Diagnosis Date Noted   Hyponatremia 10/09/2021   Subclavian artery stenosis (HCC) 01/30/2021   Syncope and collapse 01/30/2021   Palpitations  05/27/2017   Renal artery stenosis ([place]) 03/10/2014   Coronary atherosclerosis of native coronary artery 01/11/2014   Atherosclerosis of native arteries of the extremities with ulceration(440.23) 01/11/2014   Impotence of organic origin 01/11/2014   Unspecified hereditary and idiopathic peripheral neuropathy 01/11/2014   Mixed hyperlipidemia 01/11/2014   Sleep disturbance, unspecified 01/11/2014   Carotid artery stenosis 01/11/2014   Anxiety state, unspecified 01/11/2014   Allergic rhinitis, cause unspecified 01/11/2014   Essential hypertension, benign 01/11/2014   Edema 01/11/2014   Pain in joint, ankle and foot 09/08/2013   Metatarsalgia of both feet 09/08/2013   Bunionette 09/08/2013    Past Surgical History:  Procedure Laterality Date   CARDIAC CATHETERIZATION  2001   BM stent, prox and mid RCA   cataract surgery Bilateral 04/14   COLONOSCOPY     every 10 years   left foot surgery     RENAL ARTERY STENT  08/07       Family History  Problem Relation Age of Onset   Hypertension Mother    Hypertension Father     Social History   Tobacco Use   Smoking status: Never   Smokeless tobacco: Never  Vaping Use   Vaping Use: Never used  Substance Use Topics   Alcohol use: No   Drug use: No  Home Medications Prior to Admission medications   Medication Sig Start Date End Date Taking? Authorizing Provider  aspirin EC 81 MG tablet Take 1 tablet (81 mg total) by mouth daily. 06/01/18  Yes Lorretta Harp, MD  atorvastatin (LIPITOR) 40 MG tablet Take 40 mg by mouth daily.   Yes [provider]  Calcium Carbonate-Vitamin D (CALCIUM PLUS VITAMIN D PO) Take 1 tablet by mouth daily.    Yes [provider]  chlorthalidone (HYGROTON) 25 MG tablet Take 1 tablet (25 mg total) by mouth daily. 03/26/21 10/09/21 Yes Lorretta Harp, MD  cloNIDine (CATAPRES) 0.1 MG tablet TAKE 2 TABLETS BY MOUTH EVERY DAY Patient taking differently: Take 0.2 mg by mouth daily.  02/04/21  Yes Lorretta Harp, MD  Coenzyme Q10 (CO Q-10) 100 MG CAPS Take 100 mg by mouth daily.   Yes [provider]  finasteride (PROSCAR) 5 MG tablet Take 5 mg by mouth daily. 12/16/16  Yes [provider]  fluticasone (FLONASE) 50 MCG/ACT nasal spray Place 2 sprays into both nostrils daily as needed for allergies.   Yes [provider]  fluticasone (FLOVENT HFA) 44 MCG/ACT inhaler Inhale 2 puffs into the lungs in the morning and at bedtime. 10/01/21  Yes [provider]  folic acid (FOLVITE) 387 MCG tablet Take 400 mcg by mouth daily.   Yes [provider]  LORazepam (ATIVAN) 1 MG tablet Take 1 mg by mouth in the morning and at bedtime. 08/05/13  Yes [provider]  metoprolol tartrate (LOPRESSOR) 50 MG tablet TAKE 1 TABLET BY MOUTH TWICE A DAY Patient taking differently: Take 50 mg by mouth 2 (two) times daily. 12/06/20  Yes Lorretta Harp, MD  Multiple Vitamins-Minerals (PRESERVISION AREDS 2+MULTI VIT PO) Take 1 tablet by mouth daily.   Yes [provider]  Omega-3 Fatty Acids (FISH OIL) 1000 MG CAPS Take 1,200 mg by mouth daily.   Yes [provider]  omeprazole (PRILOSEC) 20 MG capsule Take 20 mg by mouth daily.   Yes [provider]  Polyethyl Glycol-Propyl Glycol 0.4-0.3 % SOLN Apply 1 drop to eye at bedtime as needed (dryness).   Yes [provider]  tamsulosin (FLOMAX) 0.4 MG CAPS capsule Take 0.4 mg by mouth daily. 07/13/13  Yes [provider]  triamcinolone cream (KENALOG) 0.1 % Apply 1 application topically as needed (irritation). Apply to both legs 08/01/13  Yes [provider]  valsartan (DIOVAN) 320 MG tablet TAKE 1/2 TABLET BY MOUTH 2 TIMES DAILY. PLEASE SCHEDULE AN APPT FOR FUTURE REFILLS Patient taking differently: Take 160 mg by mouth in the morning and at bedtime. Schedule an appointment for future refills 12/31/20  Yes Lorretta Harp, MD    Allergies     Amlodipine, Erythromycin, Penicillins, and Prednisone  Review of Systems   Review of Systems  Constitutional:  Negative for fever.  HENT:  Positive for congestion.   Respiratory:  Positive for cough. Negative for shortness of breath.   Cardiovascular:  Negative for chest pain.  Gastrointestinal:  Positive for vomiting. Negative for abdominal pain, diarrhea and nausea.  All other systems reviewed and are negative.  Physical Exam Updated Vital Signs BP (!) 168/106   Pulse (!) 105   Temp (!) 97.5 F (36.4 C) (Oral)   Resp (!) 25   Ht 5\' 10"  (1.778 m)   Wt 74.8 kg   SpO2 97%   BMI 23.68 kg/m   Physical Exam Vitals and nursing note reviewed.  Constitutional:      Appearance: He is well-developed.  HENT:     Head: Normocephalic and atraumatic.     Right Ear: External ear normal.     Left Ear: External ear normal.     Nose: Nose normal.  Eyes:     General:        Right eye: No discharge.        Left eye: No discharge.  Cardiovascular:     Rate and Rhythm: Normal rate and regular rhythm.     Heart sounds: Normal heart sounds.  Pulmonary:     Effort: Pulmonary effort is normal. No tachypnea or accessory muscle usage.     Breath sounds: Examination of the right-lower field reveals rhonchi. Examination of the left-lower field reveals rhonchi. Wheezing and rhonchi present.  Abdominal:     General: There is no distension.     Palpations: Abdomen is soft.     Tenderness: There is no abdominal tenderness.  Musculoskeletal:     Cervical back: Neck supple.  Skin:    General: Skin is warm and dry.  Neurological:     Mental Status: He is alert.  Psychiatric:        Mood and Affect: Mood is not anxious.    ED Results / Procedures / Treatments   Labs (all labs ordered are listed, but only abnormal results are displayed) Labs Reviewed  RESP PANEL BY RT-PCR (FLU A&B, COVID) ARPGX2 - Abnormal; Notable for the following components:      Result Value   Influenza A by PCR  POSITIVE (*)    All other components within normal limits  COMPREHENSIVE METABOLIC PANEL - Abnormal; Notable for the following components:   Sodium 118 (*)    Chloride 84 (*)    Glucose, Bld 100 (*)    BUN 27 (*)    Creatinine, Ser 1.29 (*)    Calcium 8.6 (*)    Albumin 3.2 (*)    GFR, Estimated 53 (*)    All other components within normal limits  CBC WITH DIFFERENTIAL/PLATELET - Abnormal; Notable for the following components:   RBC 3.80 (*)    Hemoglobin 11.8 (*)    HCT 32.5 (*)    MCHC 36.3 (*)    Abs Immature Granulocytes 0.12 (*)    All other components within normal limits  URINALYSIS, ROUTINE W REFLEX MICROSCOPIC - Abnormal; Notable for the following components:   Protein, ur 100 (*)    All other components within normal limits  CBC - Abnormal; Notable for the following components:   RBC 3.40 (*)    Hemoglobin 10.8 (*)    HCT 29.3 (*)    MCHC 36.9 (*)    All other components within normal limits  CBG MONITORING, ED - Abnormal; Notable for the following components:   Glucose-Capillary 117 (*)    All other components within normal limits  SODIUM, URINE, RANDOM  OSMOLALITY  OSMOLALITY, URINE  BASIC METABOLIC PANEL  BASIC METABOLIC PANEL  BASIC METABOLIC PANEL  COMPREHENSIVE METABOLIC PANEL  CBC  MAGNESIUM  PHOSPHORUS    EKG EKG Interpretation  Date/Time:  Wednesday October 09 2021 12:36:04 EST Ventricular Rate:  92 PR Interval:    QRS Duration: 137 QT Interval:  386 QTC Calculation: 478 R Axis:   51 Text Interpretation: Atrial fibrillation Right bundle branch block Confirmed by Sherwood Gambler [phone]) on 10/09/2021 12:41:57 PM  Radiology DG Chest 2 View  Result Date: 10/09/2021 CLINICAL DATA:  Cough EXAM: CHEST -  2 VIEW COMPARISON:  2010 FINDINGS: Small right suprahilar opacity. No pleural effusion or pneumothorax. Normal heart size. No acute osseous abnormality. IMPRESSION: Small right suprahilar opacity. Follow-up recommended to ensure resolution.  Electronically Signed   By: Macy Mis M.D.   On: 10/09/2021 13:24    Procedures .Critical Care Performed by: Sherwood Gambler, MD Authorized by: Sherwood Gambler, MD   Critical care provider statement:    Critical care time (minutes):  30   Critical care time was exclusive of:  Separately billable procedures and treating other patients   Critical care was necessary to treat or prevent imminent or life-threatening deterioration of the following conditions:  Metabolic crisis   Critical care was time spent personally by me on the following activities:  Development of treatment plan with patient or surrogate, discussions with consultants, evaluation of patient's response to treatment, examination of patient, ordering and review of laboratory studies, ordering and review of radiographic studies, ordering and performing treatments and interventions, pulse oximetry, re-evaluation of patient's condition and review of old charts   Medications Ordered in ED Medications  aspirin EC tablet 81 mg (has no administration in time range)  atorvastatin (LIPITOR) tablet 40 mg (has no administration in time range)  cloNIDine (CATAPRES) tablet 0.1 mg (has no administration in time range)  metoprolol tartrate (LOPRESSOR) tablet 50 mg (has no administration in time range)  LORazepam (ATIVAN) tablet 1 mg (has no administration in time range)  pantoprazole (PROTONIX) EC tablet 40 mg (has no administration in time range)  finasteride (PROSCAR) tablet 5 mg (has no administration in time range)  tamsulosin (FLOMAX) capsule 0.4 mg (has no administration in time range)  folic acid (FOLVITE) tablet 1 mg (has no administration in time range)  enoxaparin (LOVENOX) injection 40 mg (has no administration in time range)  0.9 %  sodium chloride infusion ( Intravenous New Bag/Given 10/09/21 1634)  acetaminophen (TYLENOL) tablet 650 mg (has no administration in time range)    Or  acetaminophen (TYLENOL) suppository 650 mg  (has no administration in time range)  docusate sodium (COLACE) capsule 100 mg (has no administration in time range)  ondansetron (ZOFRAN) tablet 4 mg (has no administration in time range)    Or  ondansetron (ZOFRAN) injection 4 mg (has no administration in time range)  guaiFENesin (MUCINEX) 12 hr tablet 600 mg (has no administration in time range)  sodium chloride 0.9 % bolus 500 mL (0 mLs Intravenous Stopped 10/09/21 1445)  ondansetron (ZOFRAN) injection 4 mg (4 mg Intravenous Given 10/09/21 1238)  cloNIDine (CATAPRES) tablet 0.2 mg (0.2 mg Oral Given 10/09/21 1454)  metoprolol tartrate (LOPRESSOR) tablet 50 mg (50 mg Oral Given 10/09/21 1455)    ED Course  I have reviewed the triage vital signs and the nursing notes.  Pertinent labs & imaging results that were available during my care of the patient were reviewed by me and considered in my medical decision making (see chart for details).    MDM Rules/Calculators/A&P                           Patient's lab work is unfortunately positive for significant hyponatremia down to 118.  Unclear exact cause though decreased p.o. intake and being on the chlorthalidone could be contributing.  He has been feeling generally weak but not altered.  I have discussed with Dr. Dwyane Dee who will admit.  He was originally given a 500 cc IV fluid bolus so we will hold off on  further fluids.  We will add on some extra testing and get him admitted to the hospital service.  There is a questionable pneumonia on chest x-ray but on my view this is pretty small and his cough seems to be improving so I think bacterial pneumonia is unlikely, especially with no fever or leukocytosis. Final Clinical Impression(s) / ED Diagnoses Final diagnoses:  Hyponatremia    Rx / DC Orders ED Discharge Orders     None        Sherwood Gambler, MD 10/09/21 1710

## 2021-10-09 NOTE — ED Notes (Signed)
Urine sample in triage

## 2021-10-09 NOTE — H&P (Addendum)
History and Physical    Edward [name] KDX:[id number] DOB: [birth year] DOA: 10/09/2021  PCP: Mayra Neer, MD   Patient coming from:  Home  I have personally briefly reviewed patient's old medical records in [place]  Chief Complaint: Cough, nausea and vomiting, generalized weakness.Marland Kitchen  HPI: Edward [name] is a 87 y.o. male with PMH significant for Essential hypertension, GERD, BPH, presented in the ED with complaints of vomiting since last night.  Patient reports he has been dealing with flulike symptoms since Thanksgiving (runny nose, fever, headache, cough, body ache, sore throat).  He was diagnosed with flu by his PCP last week but he was out of window to get Tamiflu.  He was advised supportive care.  Patient reports he continues to have cough which is getting worse and since last night he started throwing up,  he has vomited several times , denies any blood in the vomitus.  Patient has been using Mucinex which is not helping.  Patient reports feeling very weak, unable to get out of bed.  Patient denies any chest pain, shortness of breath, dizziness, palpitations.  He denies any recent travel or sick contacts.  He decided to come to the hospital and checked.  ED Course: He is hypertensive and tachycardic other vitals were stable. HR 105, RR 17, BP 168/106, SPO2 97% on room air, temp 97.5 Labs include sodium 118, potassium 4.1, chloride 84, bicarb 24, glucose 108, BUN 27, creatinine 1.29, calcium 8.6, anion gap 10, alkaline phosphatase 92, albumin 3.2, AST 30, ALT 29, total protein 7.0, total bilirubin 1.0, WBC 8.2, hemoglobin 11.8, hematocrit 32.5, MCV 85.5, platelet 245, influenza+, COVID-, UA: Normal serum sodium 90.  Chest x-ray; Small right suprahilar opacity. Follow-up recommended to ensure resolution.  Review of Systems: Review of Systems  Constitutional:  Positive for chills, fever and malaise/fatigue.  HENT:  Positive for congestion and sore throat.   Eyes: Negative.    Respiratory:  Positive for cough and shortness of breath.   Cardiovascular: Negative.   Gastrointestinal:  Positive for nausea and vomiting.  Genitourinary: Negative.   Musculoskeletal:  Positive for myalgias.  Skin: Negative.   Neurological:  Positive for weakness.  Endo/Heme/Allergies: Negative.   Psychiatric/Behavioral: Negative.     Past Medical History:  Diagnosis Date   Atherosclerosis of abdominal aorta (HCC)    CT/abd and pelvis   Carotid artery occlusion    bilateral carotid bruit, right greater then left -bilateral 40-50% stenosis, 12/26/09- no change   Chronic anxiety    Coronary artery disease    s/p BM stent, prox and mid RCA, 2001, cutting ballon RCA stenosis, 2002   Dysphagia    from esophageal dysmotility-tx with careful eating.    History of echocardiogram    2/11 echo EF 70%, mild MR, mildly elevated pulmonary pressures 42 mmHg   History of renal angiogram    9/10, showed patent renal stents, 30-40% instent restenosis ws the most severe lesion   Hyperlipidemia    Left kidney mass    lower pole, observing by urology- Dr. Reece Agar   Peripheral neuropathy    in both feet from nerve compression    Renovascular hypertension    s./p. bilateral RA stent implant   Subclavian artery stenosis [facility])     Past Surgical History:  Procedure Laterality Date   CARDIAC CATHETERIZATION  2001   BM stent, prox and mid RCA   cataract surgery Bilateral 04/14   COLONOSCOPY     every 10 years  left foot surgery     RENAL ARTERY STENT  08/07     reports that he has never smoked. He has never used smokeless tobacco. He reports that he does not drink alcohol and does not use drugs.  Allergies  Allergen Reactions   Amlodipine Other (See Comments)    Tolerates 2.5 mg, higher doses cause LEE with blistering   Erythromycin    Penicillins    Prednisone     Family History  Problem Relation Age of Onset   Hypertension Mother    Hypertension Father     Family history  reviewed and not pertinent.  Prior to Admission medications   Medication Sig Start Date End Date Taking? Authorizing Provider  aspirin EC 81 MG tablet Take 1 tablet (81 mg total) by mouth daily. 06/01/18   Lorretta Harp, MD  atorvastatin (LIPITOR) 40 MG tablet Take 40 mg by mouth daily.    [provider]  Calcium Carbonate-Vitamin D (CALCIUM PLUS VITAMIN D PO) Take 1 tablet by mouth daily.     [provider]  chlorthalidone (HYGROTON) 25 MG tablet Take 1 tablet (25 mg total) by mouth daily. 03/26/21 08/02/21  Lorretta Harp, MD  cloNIDine (CATAPRES) 0.1 MG tablet TAKE 2 TABLETS BY MOUTH EVERY DAY 02/04/21   Lorretta Harp, MD  Coenzyme Q10 (CO Q-10) 100 MG CAPS Take 100 mg by mouth daily.    [provider]  finasteride (PROSCAR) 5 MG tablet Take 1 tablet by mouth daily. 12/16/16   [provider]  folic acid (FOLVITE) 416 MCG tablet Take 400 mcg by mouth daily.    [provider]  LORazepam (ATIVAN) 1 MG tablet Take 1 mg by mouth 2 (two) times daily.  08/05/13   [provider]  metoprolol tartrate (LOPRESSOR) 50 MG tablet TAKE 1 TABLET BY MOUTH TWICE A DAY 12/06/20   Lorretta Harp, MD  Multiple Vitamins-Minerals (PRESERVISION AREDS 2+MULTI VIT PO) Take by mouth.    [provider]  Omega-3 Fatty Acids (FISH OIL) 1000 MG CAPS Take 1,000 mg by mouth daily.     [provider]  omeprazole (PRILOSEC) 20 MG capsule Take 20 mg by mouth daily.    [provider]  Polyethyl Glycol-Propyl Glycol 0.4-0.3 % SOLN Apply to eye at bedtime.    [provider]  tamsulosin (FLOMAX) 0.4 MG CAPS capsule Take 1 capsule by mouth daily. 07/13/13   [provider]  triamcinolone cream (KENALOG) 0.1 % Apply 1 application topically as needed.  08/01/13   [provider]  valsartan (DIOVAN) 320 MG tablet TAKE 1/2 TABLET BY MOUTH 2 TIMES DAILY. PLEASE SCHEDULE AN APPT FOR FUTURE REFILLS 12/31/20   Lorretta Harp, MD    Physical Exam: Vitals:   10/09/21 1403 10/09/21 1430 10/09/21 1454 10/09/21 1643  BP: (!) 181/89 (!) 168/106 (!) 168/106   Pulse: 100 (!) 103 (!) 105   Resp: 14 (!) 25    Temp:      TempSrc:      SpO2: 97% 97%    Weight:    74.8 kg  Height:    5\' 10"  (1.778 m)    Constitutional: Appears comfortable, not in any acute distress. Vitals:   10/09/21 1403 10/09/21 1430 10/09/21 1454 10/09/21 1643  BP: (!) 181/89 (!) 168/106 (!) 168/106   Pulse: 100 (!) 103 (!) 105   Resp: 14 (!) 25    Temp:      TempSrc:  SpO2: 97% 97%    Weight:    74.8 kg  Height:    5\' 10"  (1.778 m)   Eyes: PERRL, lids and conjunctivae normal ENMT: Mucous membranes are moist.  Posterior pharynx without exudate.Normal dentition.  Neck: normal, supple, no masses, no thyromegaly Respiratory: Clear to auscultation bilaterally, no wheezing, no crackles, no accessory muscle use. Cardiovascular: S1-S2 heard, regular rate and rhythm, no murmur.   Abdomen:Abdomen is soft, nontender, nondistended, BS+.  Musculoskeletal: No edema, no cyanosis, no clubbing. Good ROM, no contractures. Normal muscle tone.  Skin: no rashes, lesions, ulcers. No induration Neurologic: CN 2-12 grossly intact. Sensation intact, DTR normal. Strength 5/5 in all 4.  Psychiatric: Normal judgment and insight. Alert and oriented x 3. Normal mood.     Labs on Admission: I have personally reviewed following labs and imaging studies  CBC: Recent Labs  Lab 10/09/21 1231 10/09/21 1633  WBC 8.2 7.2  NEUTROABS 6.1  --   HGB 11.8* 10.8*  HCT 32.5* 29.3*  MCV 85.5 86.2  PLT 245 701   Basic Metabolic Panel: Recent Labs  Lab 10/09/21 1231  NA 118*  K 4.1  CL 84*  CO2 24  GLUCOSE 100*  BUN 27*  CREATININE 1.29*  CALCIUM 8.6*   GFR: Estimated Creatinine Clearance: 40.1 mL/min (A) (by C-G formula based on SCr of 1.29 mg/dL (H)). Liver Function Tests: Recent Labs  Lab 10/09/21 1231  AST 30  ALT 29  ALKPHOS 92   BILITOT 1.0  PROT 7.0  ALBUMIN 3.2*   No results for input(s): LIPASE, AMYLASE in the last 168 hours. No results for input(s): AMMONIA in the last 168 hours. Coagulation Profile: No results for input(s): INR, PROTIME in the last 168 hours. Cardiac Enzymes: No results for input(s): CKTOTAL, CKMB, CKMBINDEX, TROPONINI in the last 168 hours. BNP (last 3 results) No results for input(s): PROBNP in the last 8760 hours. HbA1C: No results for input(s): HGBA1C in the last 72 hours. CBG: Recent Labs  Lab 10/09/21 1231  GLUCAP 117*   Lipid Profile: No results for input(s): CHOL, HDL, LDLCALC, TRIG, CHOLHDL, LDLDIRECT in the last 72 hours. Thyroid Function Tests: No results for input(s): TSH, T4TOTAL, FREET4, T3FREE, THYROIDAB in the last 72 hours. Anemia Panel: No results for input(s): VITAMINB12, FOLATE, FERRITIN, TIBC, IRON, RETICCTPCT in the last 72 hours. Urine analysis:    Component Value Date/Time   COLORURINE YELLOW 10/09/2021 1329   APPEARANCEUR CLEAR 10/09/2021 1329   LABSPEC 1.015 10/09/2021 1329   PHURINE 7.5 10/09/2021 1329   GLUCOSEU NEGATIVE 10/09/2021 1329   HGBUR NEGATIVE 10/09/2021 1329   BILIRUBINUR NEGATIVE 10/09/2021 1329   KETONESUR NEGATIVE 10/09/2021 1329   PROTEINUR 100 (A) 10/09/2021 1329   NITRITE NEGATIVE 10/09/2021 1329   LEUKOCYTESUR NEGATIVE 10/09/2021 1329    Radiological Exams on Admission: DG Chest 2 View  Result Date: 10/09/2021 CLINICAL DATA:  Cough EXAM: CHEST - 2 VIEW COMPARISON:  2010 FINDINGS: Small right suprahilar opacity. No pleural effusion or pneumothorax. Normal heart size. No acute osseous abnormality. IMPRESSION: Small right suprahilar opacity. Follow-up recommended to ensure resolution. Electronically Signed   By: Macy Mis M.D.   On: 10/09/2021 13:24    EKG: Independently reviewed.  EKG shows right bundle branch block,  Atrial fibrillation.  Assessment/Plan Principal Problem:   Hyponatremia Active Problems:   Coronary  atherosclerosis of native coronary artery   Mixed hyperlipidemia   Essential hypertension, benign   Renal artery stenosis (HCC)   Subclavian artery stenosis ([place])  Generalized weakness secondary to hyponatremia.: Patient presented with generalized weakness, in the setting of influenza+. Patient has been dealing with flu like symptoms for last 10 days. He is found to have sodium 118, baseline sodium remains between 128-130. Seems slightly dehydrated,  There is no focal neurological deficits. Continue IV gentle hydration, monitor BMP every 6 hours. PT and OT evaluation.  Hyponatremia : Baseline sodium 128-130, presented with Na 118. It seems hypovolumic hyponatremia. Continue NS @75cc /hr Monitor BMP q6h  AKI: Baseline serum creatinine normal.  Presented with serum creatinine 1.29 Suspect prerenal due to decreased p.o. intake, nausea, vomiting Continue IV gentle hydration, avoid nephrotoxic medications  CAD: Continue cardioprotective medications,  Continue aspirin, Lipitor, metoprolol. Patient denies any chest pain.  Essential hypertension: Continue clonidine and metoprolol. Hold chlorthalidone, Valsartan.  BPH: Continue Flomax and Proscar.  Nausea and vomiting: Continue Zofran as needed. Continue gentle IV hydration  Anxiety disorder: Continue lorazepam as needed.  GERD: Continue pantoprazole.  History of PSVT / A.fibrillation: Continue metoprolol. HR controlled. Prior EKGs shows evidence of SVT,  PACs PVC. EKG in ED shows A. fibrillation. Patient denies any palpitations.  Obtain 2D echocardiogram. Cardio consulted. HR controlled.   DVT prophylaxis: Lovenox Code Status: Full code. Family Communication: No family at bed side. Disposition Plan:  Status is: Inpatient  Remains inpatient appropriate because: Hyponatremia, needs workup and management.  Consults called:  Cardiology Admission status: Inpatient   Shawna Clamp MD [facility]  If  7PM-7AM, please contact night-coverage   10/09/2021, 5:01 PM

## 2021-10-09 NOTE — ED Notes (Signed)
Pt given crackers and ginger ale for Fluid/PO challenge.

## 2021-10-09 NOTE — ED Notes (Signed)
Pt stated he has not taken his BP meds today.

## 2021-10-10 ENCOUNTER — Inpatient Hospital Stay (HOSPITAL_COMMUNITY): Payer: Medicare Other

## 2021-10-10 DIAGNOSIS — I2510 Atherosclerotic heart disease of native coronary artery without angina pectoris: Secondary | ICD-10-CM

## 2021-10-10 DIAGNOSIS — E871 Hypo-osmolality and hyponatremia: Principal | ICD-10-CM

## 2021-10-10 DIAGNOSIS — R9431 Abnormal electrocardiogram [ECG] [EKG]: Secondary | ICD-10-CM

## 2021-10-10 DIAGNOSIS — I10 Essential (primary) hypertension: Secondary | ICD-10-CM

## 2021-10-10 DIAGNOSIS — I4819 Other persistent atrial fibrillation: Secondary | ICD-10-CM

## 2021-10-10 DIAGNOSIS — J101 Influenza due to other identified influenza virus with other respiratory manifestations: Secondary | ICD-10-CM

## 2021-10-10 LAB — BASIC METABOLIC PANEL
Anion gap: 8 (ref 5–15)
BUN: 27 mg/dL — ABNORMAL HIGH (ref 8–23)
CO2: 23 mmol/L (ref 22–32)
Calcium: 7.6 mg/dL — ABNORMAL LOW (ref 8.9–10.3)
Chloride: 88 mmol/L — ABNORMAL LOW (ref 98–111)
Creatinine, Ser: 1.29 mg/dL — ABNORMAL HIGH (ref 0.61–1.24)
GFR, Estimated: 53 mL/min — ABNORMAL LOW (ref >60)
Glucose, Bld: 92 mg/dL (ref 70–99)
Potassium: 4.1 mmol/L (ref 3.5–5.1)
Sodium: 119 mmol/L — CL (ref 135–145)

## 2021-10-10 LAB — COMPREHENSIVE METABOLIC PANEL
ALT: 24 U/L (ref 0–44)
AST: 28 U/L (ref 15–41)
Albumin: 2.6 g/dL — ABNORMAL LOW (ref 3.5–5.0)
Alkaline Phosphatase: 76 U/L (ref 38–126)
Anion gap: 7 (ref 5–15)
BUN: 26 mg/dL — ABNORMAL HIGH (ref 8–23)
CO2: 24 mmol/L (ref 22–32)
Calcium: 7.8 mg/dL — ABNORMAL LOW (ref 8.9–10.3)
Chloride: 87 mmol/L — ABNORMAL LOW (ref 98–111)
Creatinine, Ser: 1.24 mg/dL (ref 0.61–1.24)
GFR, Estimated: 56 mL/min — ABNORMAL LOW (ref >60)
Glucose, Bld: 95 mg/dL (ref 70–99)
Potassium: 4.2 mmol/L (ref 3.5–5.1)
Sodium: 118 mmol/L — CL (ref 135–145)
Total Bilirubin: 0.8 mg/dL (ref 0.3–1.2)
Total Protein: 5.7 g/dL — ABNORMAL LOW (ref 6.5–8.1)

## 2021-10-10 LAB — CBC
HCT: 27.9 % — ABNORMAL LOW (ref 39.0–52.0)
Hemoglobin: 10.1 g/dL — ABNORMAL LOW (ref 13.0–17.0)
MCH: 32 pg (ref 26.0–34.0)
MCHC: 36.2 g/dL — ABNORMAL HIGH (ref 30.0–36.0)
MCV: 88.3 fL (ref 80.0–100.0)
Platelets: 240 10*3/uL (ref 150–400)
RBC: 3.16 MIL/uL — ABNORMAL LOW (ref 4.22–5.81)
RDW: 12.1 % (ref 11.5–15.5)
WBC: 5.9 10*3/uL (ref 4.0–10.5)
nRBC: 0 % (ref 0.0–0.2)

## 2021-10-10 LAB — ECHOCARDIOGRAM COMPLETE
AR max vel: 1.71 cm2
AV Peak grad: 5.9 mmHg
Ao pk vel: 1.21 m/s
Area-P 1/2: 5.06 cm2
Calc EF: 51.4 %
Height: 70 in
S' Lateral: 2.9 cm
Single Plane A2C EF: 53.9 %
Single Plane A4C EF: 51.2 %
Weight: 2640 oz

## 2021-10-10 LAB — PHOSPHORUS: Phosphorus: 2.9 mg/dL (ref 2.5–4.6)

## 2021-10-10 LAB — MAGNESIUM: Magnesium: 1.3 mg/dL — ABNORMAL LOW (ref 1.7–2.4)

## 2021-10-10 MED ORDER — MAGNESIUM SULFATE 4 GM/100ML IV SOLN
4.0000 g | Freq: Once | INTRAVENOUS | Status: AC
  Administered 2021-10-10: 4 g via INTRAVENOUS
  Filled 2021-10-10: qty 100

## 2021-10-10 MED ORDER — IRBESARTAN 75 MG PO TABS
37.5000 mg | ORAL_TABLET | Freq: Every day | ORAL | Status: DC
  Administered 2021-10-10 – 2021-10-11 (×2): 37.5 mg via ORAL
  Filled 2021-10-10 (×2): qty 1
  Filled 2021-10-10: qty 0.5

## 2021-10-10 MED ORDER — APIXABAN 5 MG PO TABS
5.0000 mg | ORAL_TABLET | Freq: Two times a day (BID) | ORAL | Status: DC
  Administered 2021-10-10 – 2021-10-13 (×7): 5 mg via ORAL
  Filled 2021-10-10 (×8): qty 1

## 2021-10-10 NOTE — Progress Notes (Signed)
PROGRESS NOTE    AM[facility] STURTEVANT  TMH:[id number] DOB: Nov 15, 1929 DOA: 10/09/2021 PCP: Mayra Neer, MD    Brief Narrative:  MIHAILO [name] is an 87 year old male with past medical history significant for essential hypertension, CAD, PAD, history of SVT, GERD, BPH who presented to [facility] ED on 12/7 with complaints of vomiting since last night.  Patient reports is dealing with flulike symptoms since Thanksgiving with associated runny nose, fever, headache, cough, body aches and sore throat.  He was diagnosed with influenza A by his PCP last week but was out of the window to get Tamiflu.  He was then advised supportive care.  He reports continues with cough that is getting worse.  Denies any blood in his vomitus.  Has been using Mucinex which is not helping.  Also reports feeling very weak and unable to get out of bed.  Denies chest pain, no shortness of breath, no dizziness, no palpitations.  No recent travel or sick contacts.  In the ED, temperature 97.5 F, HR 91, RR 17, BP 203/120, SPO2 99% on room air.  Sodium 118, potassium 4.1, chloride 84, CO2 24, glucose 100, BUN 27, creatinine 1.29, AST 30, ALT 29, serum osmolality 252.  WBC 8.2, hemoglobin 11.8, platelets 245.  Influenza A PCR positive.  Influenza B PCR negative.  COVID-19 PCR negative.  Urinalysis unrevealing.  Chest x-ray with small right suprahilar opacity; no pleural effusion or pneumothorax.  EDP consulted [facility] for further evaluation and management of generalized weakness in the setting of hyponatremia with poor oral intake, AKI, poorly controlled hypertension and new onset atrial fibrillation.   Assessment & Plan:   Principal Problem:   Hyponatremia Active Problems:   Coronary atherosclerosis of native coronary artery   Mixed hyperlipidemia   Essential hypertension, benign   Renal artery stenosis (HCC)   Subclavian artery stenosis (HCC)   Hypovolemic hyponatremia 2/2 hypovolemia versus medication side effect Patient presenting  to ED with poor oral intake, recent diagnosis of influenza a outpatient with associated nausea and vomiting.  Sodium 118 on arrival.  Serum osmolality low, urine sodium 90, urine osmolality 430.  Suspect hypovolemic hyponatremia in the setting of poor oral intake and vomiting, also consideration of chlorthalidone use outpatient. --Continue IV fluid hydration with NS at 100 mL/h --Discontinue chlorthalidone --Supportive care, antiemetics; encourage increased oral intake --Repeat BMP in a.m.  Atrial fibrillation, new onset Patient with previous history of SVT, follows with electrophysiology outpatient, Dr. Massie Maroon. Curt Bears.  EKG now with atrial fibrillation, likely related to acute illness from positive influenza versus electrolyte derangement.  Seen by cardiology and recommends initiation of anticoagulation with Eliquis and continue rate control strategy with metoprolol; and plan outpatient follow-up for consideration of cardioversion in 3-4 weeks if remains in A. fib.  CHADS2 VASc Score = 4.  TTE with LVEF 60 to 65%, no LV regional wall motion normalities, mild concentric LVH, diastolic function indeterminant, LA moderately dilated, RA mildly dilated, moderate TR, mild MR, IVC normal in size. --Metoprolol tartrate 50mg  PO BID --Eliquis 5 mg p.o. twice daily --Continue monitor on telemetry  Hypomagnesemia Magnesium 1.3, will replete. --Repeat electrolytes in the a.m.  Essential hypertension --Clonidine 0.1 mg twice daily --Metoprolol tartrate 50 mg p.o. twice daily --Irbesartan 37.5 mg Daily (substituted for home valsartan) --Holding home chlorthalidone due to hyponatremia --Continue monitor BP closely  Hx SVT --Continue beta-blocker as above --Monitor on telemetry  CAD s/p PCI RCA Hyperlipidemia --Continue aspirin 81 mg p.o. daily, atorvastatin 40 mg p.o. daily  CKD  stage IIIb Baseline creatinine 1.4-1.7.  Currently at baseline. --Cr 1.29>1.27>1.43>1.29>1.24 --Avoid nephrotoxins,  renal dose all medications --Repeat BMP in a.m.  BPH: --Tamsulosin 0.4 mg p.o. daily  Weakness/debility/deconditioning: --PT/OT evaluation pending   DVT prophylaxis:  apixaban (ELIQUIS) tablet 5 mg    Code Status: Full Code Family Communication: No family present at bedside this morning  Disposition Plan:  Level of care: Telemetry Status is: Inpatient  Remains inpatient appropriate because: Pending PT/OT evaluation, close monitoring of new onset atrial fibrillation.    Consultants:  Cardiology - signed off 12/8  Procedures:  TTE  Antimicrobials:  None   Subjective: Patient seen examined bedside, resting comfortably.  Continues with global weakness, fatigue and myalgias.  Poor oral intake.  No other specific complaints or concerns at this time.  No family present at bedside.  Remains in ED holding area.  Seen by cardiology this morning with recommendations of initiation of anticoagulation and rate control for A. fib with outpatient follow-up.  Patient with no other questions at this time.  Denies headache, no dizziness, no chest pain, no shortness of breath, no abdominal pain.  No acute events overnight per nursing staff.  Objective: Vitals:   10/10/21 0700 10/10/21 0730 10/10/21 1055 10/10/21 1330  BP: (!) 147/94 129/75 (!) 176/91 (!) 153/87  Pulse: 79 82 (!) 105 97  Resp: 19 16 (!) 27 17  Temp:      TempSrc:      SpO2: 97% 97% 98% 98%  Weight:      Height:       No intake or output data in the 24 hours ending 10/10/21 1508 Filed Weights   10/09/21 1643  Weight: 74.8 kg    Examination:  General exam: Appears calm and comfortable; chronically ill in appearance Respiratory system: Clear to auscultation. Respiratory effort normal.  On room air Cardiovascular system: S1 & S2 heard, irregularly irregular rhythm, normal rate. No JVD, murmurs, rubs, gallops or clicks. No pedal edema. Gastrointestinal system: Abdomen is nondistended, soft and nontender. No  organomegaly or masses felt. Normal bowel sounds heard. Central nervous system: Alert and oriented. No focal neurological deficits. Extremities: Symmetric 5 x 5 power. Skin: No rashes, lesions or ulcers Psychiatry: Judgement and insight appear normal. Mood & affect appropriate.     Data Reviewed: I have personally reviewed following labs and imaging studies  CBC: Recent Labs  Lab 10/09/21 1231 10/09/21 1633 10/10/21 0533  WBC 8.2 7.2 5.9  NEUTROABS 6.1  --   --   HGB 11.8* 10.8* 10.1*  HCT 32.5* 29.3* 27.9*  MCV 85.5 86.2 88.3  PLT 245 244 546   Basic Metabolic Panel: Recent Labs  Lab 10/09/21 1231 10/09/21 1633 10/09/21 2129 10/10/21 0315 10/10/21 0533  NA 118* 118* 119* 119* 118*  K 4.1 3.8 4.3 4.1 4.2  CL 84* 85* 87* 88* 87*  CO2 24 24 23 23 24   GLUCOSE 100* 132* 125* 92 95  BUN 27* 25* 26* 27* 26*  CREATININE 1.29* 1.27* 1.43* 1.29* 1.24  CALCIUM 8.6* 8.1* 8.1* 7.6* 7.8*  MG  --   --   --   --  1.3*  PHOS  --   --   --   --  2.9   GFR: Estimated Creatinine Clearance: 41.7 mL/min (by C-G formula based on SCr of 1.24 mg/dL). Liver Function Tests: Recent Labs  Lab 10/09/21 1231 10/10/21 0533  AST 30 28  ALT 29 24  ALKPHOS 92 76  BILITOT 1.0 0.8  PROT  7.0 5.7*  ALBUMIN 3.2* 2.6*   No results for input(s): LIPASE, AMYLASE in the last 168 hours. No results for input(s): AMMONIA in the last 168 hours. Coagulation Profile: No results for input(s): INR, PROTIME in the last 168 hours. Cardiac Enzymes: No results for input(s): CKTOTAL, CKMB, CKMBINDEX, TROPONINI in the last 168 hours. BNP (last 3 results) No results for input(s): PROBNP in the last 8760 hours. HbA1C: No results for input(s): HGBA1C in the last 72 hours. CBG: Recent Labs  Lab 10/09/21 1231  GLUCAP 117*   Lipid Profile: No results for input(s): CHOL, HDL, LDLCALC, TRIG, CHOLHDL, LDLDIRECT in the last 72 hours. Thyroid Function Tests: No results for input(s): TSH, T4TOTAL, FREET4,  T3FREE, THYROIDAB in the last 72 hours. Anemia Panel: No results for input(s): VITAMINB12, FOLATE, FERRITIN, TIBC, IRON, RETICCTPCT in the last 72 hours. Sepsis Labs: No results for input(s): PROCALCITON, LATICACIDVEN in the last 168 hours.  Recent Results (from the past 240 hour(s))  Resp Panel by RT-PCR (Flu A&B, Covid) Nasopharyngeal Swab     Status: Abnormal   Collection Time: 10/09/21 10:05 AM   Specimen: Nasopharyngeal Swab; Nasopharyngeal(NP) swabs in vial transport medium  Result Value Ref Range Status   SARS Coronavirus 2 by RT PCR NEGATIVE NEGATIVE Final    Comment: (NOTE) SARS-CoV-2 target nucleic acids are NOT DETECTED.  The SARS-CoV-2 RNA is generally detectable in upper respiratory specimens during the acute phase of infection. The lowest concentration of SARS-CoV-2 viral copies this assay can detect is 138 copies/mL. A negative result does not preclude SARS-Cov-2 infection and should not be used as the sole basis for treatment or other patient management decisions. A negative result may occur with  improper specimen collection/handling, submission of specimen other than nasopharyngeal swab, presence of viral mutation(s) within the areas targeted by this assay, and inadequate number of viral copies(<138 copies/mL). A negative result must be combined with clinical observations, patient history, and epidemiological information. The expected result is Negative.  Fact Sheet for Patients:  EntrepreneurPulse.com.au  Fact Sheet for Healthcare Providers:  IncredibleEmployment.be  This test is no t yet approved or cleared by the [place] FDA and  has been authorized for detection and/or diagnosis of SARS-CoV-2 by FDA under an Emergency Use Authorization (EUA). This EUA will remain  in effect (meaning this test can be used) for the duration of the COVID-19 declaration under Section 564(b)(1) of the Act, 21 U.S.C.section  360bbb-3(b)(1), unless the authorization is terminated  or revoked sooner.       Influenza A by PCR POSITIVE (A) NEGATIVE Final   Influenza B by PCR NEGATIVE NEGATIVE Final    Comment: (NOTE) The Xpert Xpress SARS-CoV-2/FLU/RSV plus assay is intended as an aid in the diagnosis of influenza from Nasopharyngeal swab specimens and should not be used as a sole basis for treatment. Nasal washings and aspirates are unacceptable for Xpert Xpress SARS-CoV-2/FLU/RSV testing.  Fact Sheet for Patients: EntrepreneurPulse.com.au  Fact Sheet for Healthcare Providers: IncredibleEmployment.be  This test is not yet approved or cleared by the [place] FDA and has been authorized for detection and/or diagnosis of SARS-CoV-2 by FDA under an Emergency Use Authorization (EUA). This EUA will remain in effect (meaning this test can be used) for the duration of the COVID-19 declaration under Section 564(b)(1) of the Act, 21 U.S.C. section 360bbb-3(b)(1), unless the authorization is terminated or revoked.  Performed at [facility], [place] [address] [place], [place] [address]  Radiology Studies: DG Chest 2 View  Result Date: 10/09/2021 CLINICAL DATA:  Cough EXAM: CHEST - 2 VIEW COMPARISON:  2010 FINDINGS: Small right suprahilar opacity. No pleural effusion or pneumothorax. Normal heart size. No acute osseous abnormality. IMPRESSION: Small right suprahilar opacity. Follow-up recommended to ensure resolution. Electronically Signed   By: Macy Mis M.D.   On: 10/09/2021 13:24   ECHOCARDIOGRAM COMPLETE  Result Date: 10/10/2021    ECHOCARDIOGRAM REPORT   Patient Name:   CEM KOSMAN [facility] Date of Exam: 10/10/2021 Medical Rec #:  [id number]      Height:       70.0 in Accession #:    [id number]     Weight:       165.0 lb Date of Birth:  08-25-1930     BSA:          1.923 m Patient Age:    89 years       BP:           157/88 mmHg Patient  Gender: M              HR:           101 bpm. Exam Location:  Inpatient Procedure: 2D Echo, Color Doppler and Cardiac Doppler Indications:    Abnormal EKG  History:        Patient has prior history of Echocardiogram examinations. Risk                 Factors:Hypertension.  Sonographer:    Jyl Heinz Referring Phys: [place]  1. Left ventricular ejection fraction, by estimation, is 60 to 65%. The left ventricle has normal function. The left ventricle has no regional wall motion abnormalities. There is mild concentric left ventricular hypertrophy. Diastolic function is indeterminant due to atrial fibrillation.  2. Right ventricular systolic function is normal. The right ventricular size is normal. There is mildly elevated pulmonary artery systolic pressure. The estimated right ventricular systolic pressure is 29.9 mmHg.  3. Left atrial size was moderately dilated.  4. Right atrial size was mildly dilated.  5. The mitral valve is normal in structure. Mild mitral valve regurgitation.  6. Tricuspid valve regurgitation is moderate.  7. The aortic valve is tricuspid. There is mild calcification of the aortic valve. There is mild thickening of the aortic valve. Aortic valve regurgitation is trivial.  8. The inferior vena cava is normal in size with greater than 50% respiratory variability, suggesting right atrial pressure of 3 mmHg. Comparison(s): Compared to prior TTE in 01/2021, the patient is now in AFib. Otherwise, there is no significant change. FINDINGS  Left Ventricle: Left ventricular ejection fraction, by estimation, is 60 to 65%. The left ventricle has normal function. The left ventricle has no regional wall motion abnormalities. The left ventricular internal cavity size was normal in size. There is  mild concentric left ventricular hypertrophy. Diastolic function is indeterminant due to atrial fibrillation. Right Ventricle: The right ventricular size is normal. No increase in right  ventricular wall thickness. Right ventricular systolic function is normal. There is mildly elevated pulmonary artery systolic pressure. The tricuspid regurgitant velocity is 3.24  m/s, and with an assumed right atrial pressure of 3 mmHg, the estimated right ventricular systolic pressure is 37.1 mmHg. Left Atrium: Left atrial size was moderately dilated. Right Atrium: Right atrial size was mildly dilated. Pericardium: There is no evidence of pericardial effusion. Mitral Valve: The mitral valve is normal in structure. There is mild thickening of the mitral  valve leaflet(s). There is mild calcification of the mitral valve leaflet(s). Mild mitral annular calcification. Mild mitral valve regurgitation. Tricuspid Valve: The tricuspid valve is normal in structure. Tricuspid valve regurgitation is moderate. Aortic Valve: The aortic valve is tricuspid. There is mild calcification of the aortic valve. There is mild thickening of the aortic valve. Aortic valve regurgitation is trivial. Aortic valve peak gradient measures 5.9 mmHg. Pulmonic Valve: The pulmonic valve was normal in structure. Pulmonic valve regurgitation is trivial. Aorta: The aortic root and ascending aorta are structurally normal, with no evidence of dilitation. Venous: The inferior vena cava is normal in size with greater than 50% respiratory variability, suggesting right atrial pressure of 3 mmHg. IAS/Shunts: No atrial level shunt detected by color flow Doppler.  LEFT VENTRICLE PLAX 2D LVIDd:         4.50 cm     Diastology LVIDs:         2.90 cm     LV e' medial:    6.31 cm/s LV PW:         1.20 cm     LV E/e' medial:  15.1 LV IVS:        1.40 cm     LV e' lateral:   8.27 cm/s LVOT diam:     2.00 cm     LV E/e' lateral: 11.5 LV SV:         39 LV SV Index:   20 LVOT Area:     3.14 cm  LV Volumes (MOD) LV vol d, MOD A2C: 56.2 ml LV vol d, MOD A4C: 62.3 ml LV vol s, MOD A2C: 25.9 ml LV vol s, MOD A4C: 30.4 ml LV SV MOD A2C:     30.3 ml LV SV MOD A4C:     62.3  ml LV SV MOD BP:      30.9 ml RIGHT VENTRICLE            IVC RV Basal diam:  3.90 cm    IVC diam: 1.50 cm RV Mid diam:    2.70 cm RV S prime:     7.40 cm/s TAPSE (M-mode): 1.9 cm LEFT ATRIUM             Index        RIGHT ATRIUM           Index LA diam:        4.30 cm 2.24 cm/m   RA Area:     22.80 cm LA Vol (A2C):   79.8 ml 41.49 ml/m  RA Volume:   66.50 ml  34.57 ml/m LA Vol (A4C):   69.5 ml 36.13 ml/m LA Biplane Vol: 77.3 ml 40.19 ml/m  AORTIC VALVE AV Area (Vmax): 1.71 cm AV Vmax:        121.00 cm/s AV Peak Grad:   5.9 mmHg LVOT Vmax:      65.90 cm/s LVOT Vmean:     47.800 cm/s LVOT VTI:       0.124 m  AORTA Ao Root diam: 2.70 cm Ao Asc diam:  2.80 cm MITRAL VALVE               TRICUSPID VALVE MV Area (PHT): 5.06 cm    TR Peak grad:   42.0 mmHg MV Decel Time: 150 msec    TR Vmax:        324.00 cm/s MV E velocity: 95.50 cm/s MV A velocity: 33.00 cm/s  SHUNTS MV E/A ratio:  2.89  Systemic VTI:  0.12 m                            Systemic Diam: 2.00 cm Gwyndolyn Kaufman MD Electronically signed by Gwyndolyn Kaufman MD Signature Date/Time: 10/10/2021/1:09:35 PM    Final         Scheduled Meds:  apixaban  5 mg Oral BID   aspirin EC  81 mg Oral Daily   atorvastatin  40 mg Oral Daily   cloNIDine  0.1 mg Oral BID   docusate sodium  100 mg Oral BID   finasteride  5 mg Oral Daily   folic acid  1 mg Oral Daily   guaiFENesin  600 mg Oral BID   LORazepam  1 mg Oral BID   metoprolol tartrate  50 mg Oral BID   pantoprazole  40 mg Oral Daily   tamsulosin  0.4 mg Oral Daily   Continuous Infusions:  sodium chloride 75 mL/hr at 10/10/21 0243     LOS: 1 day    Time spent: 46 minutes spent on chart review, discussion with nursing staff, consultants, updating family and interview/physical exam; more than 50% of that time was spent in counseling and/or coordination of care.    Chonte Ricke J [place] DO [facility] Available via Epic secure chat 7am-7pm After these hours, please refer to  coverage provider listed on [email or web address] 10/10/2021, 3:08 PM

## 2021-10-10 NOTE — ED Notes (Signed)
Paged Dr Olena Heckle for critical result after no response from [facility]

## 2021-10-10 NOTE — ED Notes (Signed)
Paged Alcario Drought for critical, instructed to page Olena Heckle

## 2021-10-10 NOTE — Discharge Instructions (Addendum)

## 2021-10-10 NOTE — Consult Note (Addendum)
Cardiology Consultation:   Patient ID: Edward [name] MRN: [id number]; DOB: 01-30-1930  Admit date: 10/09/2021 Date of Consult: 10/10/2021  PCP:  Edward Neer, MD   [facility] Providers Cardiologist:  Edward Burow, MD  Electrophysiologist:  Edward Haw, MD  {   Patient Profile:   Edward [name] is a 87 y.o. male with a hx of aortic atherosclerosis, SVT, carotid artery disease, subclavian artery stenosis, CAD s/p PCI to RCA, HTN, HLD, renal artery stenosis, and  who is being seen 10/10/2021 for the evaluation of new onset Afib at the request of Dr. [place]  History of Present Illness:   [name] follows with Edward [name] and is monitored for carotid artery stenosis and renal artery stenosis.  BMS to RCA in 2001.  Heart catheterization in 2002 showed known RCA disease with ISR of previously placed stent.  RCA stenosis treated with Cutting Balloon.  Renal artery stenosis previously treated with bilateral stenting in 2007.  Subclavian artery disease noted incidentally on [place] for carotid artery stenosis surveillance. He had a nonischemic nuclear stress test in January 2017.  He suffered a syncopal event in March 2022 leading to placement of a heart monitor.  Zio patch showed 380 episodes of SVT, the longest episode was 12 minutes.  The patient was asymptomatic for all of these.  He was referred to EP and saw Dr. Curt [name] who opted to continue monitoring given that he had been asymptomatic.  He presented to [facility] on 10/09/2021 with flulike symptoms, including fever and vomiting.  He was [name] to be significantly hyponatremic with sodium 118.  CXR with questionable pneumonia.  He was diagnosed with flu by PCP last week but was outside of the window for Tamiflu. EKG in ER with Afib and known RBBB. Cardiology was consulted. Respiratory panel positive for flu A.   During my interview, he is unaware of SVT or Afib. He is very hard of hearing and history is a bit difficult. He is generally  asymptomatic. He lives at home and is independent in ADLs. He does not fall frequently. No complaints of chest pain or palpitations. No recent syncope.    Past Medical History:  Diagnosis Date   Atherosclerosis of abdominal aorta (HCC)    CT/abd and pelvis   Carotid artery occlusion    bilateral carotid bruit, right greater then left -bilateral 40-50% stenosis, 12/26/09- no change   Chronic anxiety    Coronary artery disease    s/p BM stent, prox and mid RCA, 2001, cutting ballon RCA stenosis, 2002   Dysphagia    from esophageal dysmotility-tx with careful eating.    History of echocardiogram    2/11 echo EF 70%, mild MR, mildly elevated pulmonary pressures 42 mmHg   History of renal angiogram    9/10, showed patent renal stents, 30-40% instent restenosis ws the most severe lesion   Hyperlipidemia    Left kidney mass    lower pole, observing by urology- Dr. Reece [name]   Peripheral neuropathy    in both feet from nerve compression    Renovascular hypertension    s./p. bilateral RA stent implant   Subclavian artery stenosis [facility])     Past Surgical History:  Procedure Laterality Date   CARDIAC CATHETERIZATION  2001   BM stent, prox and mid RCA   cataract surgery Bilateral 04/14   COLONOSCOPY     every 10 years   left foot surgery     RENAL ARTERY STENT  08/07  Home Medications:  Prior to Admission medications   Medication Sig Start Date End Date Taking? Authorizing Provider  aspirin EC 81 MG tablet Take 1 tablet (81 mg total) by mouth daily. 06/01/18  Yes Edward Harp, MD  atorvastatin (LIPITOR) 40 MG tablet Take 40 mg by mouth daily.   Yes [provider]  Calcium Carbonate-Vitamin D (CALCIUM PLUS VITAMIN D PO) Take 1 tablet by mouth daily.    Yes [provider]  chlorthalidone (HYGROTON) 25 MG tablet Take 1 tablet (25 mg total) by mouth daily. 03/26/21 10/09/21 Yes Edward Harp, MD  cloNIDine (CATAPRES) 0.1 MG tablet TAKE 2 TABLETS BY MOUTH  EVERY DAY Patient taking differently: Take 0.2 mg by mouth daily. 02/04/21  Yes Edward Harp, MD  Coenzyme Q10 (CO Q-10) 100 MG CAPS Take 100 mg by mouth daily.   Yes [provider]  finasteride (PROSCAR) 5 MG tablet Take 5 mg by mouth daily. 12/16/16  Yes [provider]  fluticasone (FLONASE) 50 MCG/ACT nasal spray Place 2 sprays into both nostrils daily as needed for allergies.   Yes [provider]  fluticasone (FLOVENT HFA) 44 MCG/ACT inhaler Inhale 2 puffs into the lungs in the morning and at bedtime. 10/01/21  Yes [provider]  folic acid (FOLVITE) 782 MCG tablet Take 400 mcg by mouth daily.   Yes [provider]  LORazepam (ATIVAN) 1 MG tablet Take 1 mg by mouth in the morning and at bedtime. 08/05/13  Yes [provider]  metoprolol tartrate (LOPRESSOR) 50 MG tablet TAKE 1 TABLET BY MOUTH TWICE A DAY Patient taking differently: Take 50 mg by mouth 2 (two) times daily. 12/06/20  Yes Edward Harp, MD  Multiple Vitamins-Minerals (PRESERVISION AREDS 2+MULTI VIT PO) Take 1 tablet by mouth daily.   Yes [provider]  Omega-3 Fatty Acids (FISH OIL) 1000 MG CAPS Take 1,200 mg by mouth daily.   Yes [provider]  omeprazole (PRILOSEC) 20 MG capsule Take 20 mg by mouth daily.   Yes [provider]  Polyethyl Glycol-Propyl Glycol 0.4-0.3 % SOLN Apply 1 drop to eye at bedtime as needed (dryness).   Yes [provider]  tamsulosin (FLOMAX) 0.4 MG CAPS capsule Take 0.4 mg by mouth daily. 07/13/13  Yes [provider]  triamcinolone cream (KENALOG) 0.1 % Apply 1 application topically as needed (irritation). Apply to both legs 08/01/13  Yes [provider]  valsartan (DIOVAN) 320 MG tablet TAKE 1/2 TABLET BY MOUTH 2 TIMES DAILY. PLEASE SCHEDULE AN APPT FOR FUTURE REFILLS Patient taking differently: Take 160 mg by mouth in the morning and at bedtime. Schedule an appointment for future  refills 12/31/20  Yes Edward Harp, MD    Inpatient Medications: Scheduled Meds:  aspirin EC  81 mg Oral Daily   atorvastatin  40 mg Oral Daily   cloNIDine  0.1 mg Oral BID   docusate sodium  100 mg Oral BID   enoxaparin (LOVENOX) injection  40 mg Subcutaneous Q24H   finasteride  5 mg Oral Daily   folic acid  1 mg Oral Daily   guaiFENesin  600 mg Oral BID   LORazepam  1 mg Oral BID   metoprolol tartrate  50 mg Oral BID   pantoprazole  40 mg Oral Daily   tamsulosin  0.4 mg Oral Daily   Continuous Infusions:  sodium chloride 75 mL/hr at 10/10/21 0243   PRN Meds: acetaminophen **OR** acetaminophen, ondansetron **OR** ondansetron (ZOFRAN) IV  Allergies:    Allergies  Allergen Reactions   Amlodipine Other (See Comments)    Tolerates 2.5 mg, higher doses cause LEE with blistering   Erythromycin    Penicillins    Prednisone     Social History:   Social History   Socioeconomic History   Marital status: Married    Spouse name: Not on file   Number of children: Not on file   Years of education: Not on file   Highest education level: Not on file  Occupational History   Not on file  Tobacco Use   Smoking status: Never   Smokeless tobacco: Never  Vaping Use   Vaping Use: Never used  Substance and Sexual Activity   Alcohol use: No   Drug use: No   Sexual activity: Not on file  Other Topics Concern   Not on file  Social History Narrative   Not on file   Social Determinants of Health   Financial Resource Strain: Not on file  Food Insecurity: Not on file  Transportation Needs: Not on file  Physical Activity: Not on file  Stress: Not on file  Social Connections: Not on file  Intimate Partner Violence: Not on file    Family History:    Family History  Problem Relation Age of Onset   Hypertension Mother    Hypertension Father      ROS:  Please see the history of present illness.   All other ROS reviewed and negative.     Physical Exam/Data:    Vitals:   10/10/21 0500 10/10/21 0530 10/10/21 0600 10/10/21 0630  BP: 99/70 (!) 158/88 (!) 164/96 (!) 157/88  Pulse: 83 82 85 82  Resp: 14 18 17 18   Temp:      TempSrc:      SpO2: 97% 98% 97% 97%  Weight:      Height:       No intake or output data in the 24 hours ending 10/10/21 0953 Last 3 Weights 10/09/2021 08/02/2021 06/18/2021  Weight (lbs) 165 lb 168 lb 3.2 oz 166 lb 9.6 oz  Weight (kg) 74.844 kg 76.295 kg 75.569 kg     Body mass index is 23.68 kg/m.  General:  elderly male in NAD, HOH HEENT: normal Neck: no JVD Vascular: No carotid bruits; Distal pulses 2+ bilaterally Cardiac:  irregular rhythm, regular rate Lungs:  clear to auscultation bilaterally, no wheezing, rhonchi or rales  Abd: soft, nontender, no hepatomegaly  Ext: no edema Musculoskeletal:  No deformities, BUE and BLE strength normal and equal Skin: warm and dry  Neuro:  CNs 2-12 intact, no focal abnormalities noted Psych:  Normal affect   EKG:  The EKG was personally reviewed and demonstrates:  atrial fibrillation with ventricular rate 92 with RBBB (old) Telemetry:  Telemetry was personally reviewed and demonstrates:  atrial fibrillation in the 90-110s  Relevant CV Studies:  Zio patch 02/05/21: Patch Wear Time:  13 days and 21 hours   Patient had a min HR of 40 bpm, max HR of 190 bpm, and avg HR of 51 bpm. Predominant underlying rhythm was Sinus Rhythm. 1 run of Ventricular Tachycardia occurred lasting 4 beats with a max rate of 190 bpm (avg 138 bpm). 380 Supraventricular Tachycardia  runs occurred, the run with the fastest interval lasting 5 beats with a max rate of 190 bpm, the longest lasting 12 mins 13 secs with an avg rate of 146 bpm. Isolated SVEs were frequent (7.8%, 36144). Isolated VEs were rare (<1.0%).  There were no patient triggered events.  Laboratory Data:  High Sensitivity Troponin:  No results for input(s): TROPONINIHS in the last 720 hours.   Chemistry Recent Labs  Lab 10/09/21 2129  10/10/21 0315 10/10/21 0533  NA 119* 119* 118*  K 4.3 4.1 4.2  CL 87* 88* 87*  CO2 23 23 24   GLUCOSE 125* 92 95  BUN 26* 27* 26*  CREATININE 1.43* 1.29* 1.24  CALCIUM 8.1* 7.6* 7.8*  MG  --   --  1.3*  GFRNONAA 47* 53* 56*  ANIONGAP 9 8 7     Recent Labs  Lab 10/09/21 1231 10/10/21 0533  PROT 7.0 5.7*  ALBUMIN 3.2* 2.6*  AST 30 28  ALT 29 24  ALKPHOS 92 76  BILITOT 1.0 0.8   Lipids No results for input(s): CHOL, TRIG, HDL, LABVLDL, LDLCALC, CHOLHDL in the last 168 hours.  Hematology Recent Labs  Lab 10/09/21 1231 10/09/21 1633 10/10/21 0533  WBC 8.2 7.2 5.9  RBC 3.80* 3.40* 3.16*  HGB 11.8* 10.8* 10.1*  HCT 32.5* 29.3* 27.9*  MCV 85.5 86.2 88.3  MCH 31.1 31.8 32.0  MCHC 36.3* 36.9* 36.2*  RDW 11.9 11.9 12.1  PLT 245 244 240   Thyroid No results for input(s): TSH, FREET4 in the last 168 hours.  BNPNo results for input(s): BNP, PROBNP in the last 168 hours.  DDimer No results for input(s): DDIMER in the last 168 hours.   Radiology/Studies:  DG Chest 2 View  Result Date: 10/09/2021 CLINICAL DATA:  Cough EXAM: CHEST - 2 VIEW COMPARISON:  2010 FINDINGS: Small right suprahilar opacity. No pleural effusion or pneumothorax. Normal heart size. No acute osseous abnormality. IMPRESSION: Small right suprahilar opacity. Follow-up recommended to ensure resolution. Electronically Signed   By: Macy Mis M.D.   On: 10/09/2021 13:24     Assessment and Plan:   Atrial fibrillation - new onset this admission - hx of conduction disease - likely due to underlying influenza - moderately rate controlled  - has not received 50 mg lopressor yet this morning - monitor and titrate dose as needed - consider updating an echocardiogram - suspect Afib is related to acute illness + electrolyte derangement - will plan to rate control with BB for now - after he recovers from acute illness, may consider OP DCCV   Need for anticoagulation This patients CHA2DS2-VASc Score and  unadjusted Ischemic Stroke Rate (% per year) is equal to 4.8 % stroke rate/year from a score of 4 (2age, HTN, CAD/PAD) - he reports no history of frequent falls - given fluctuating renal disease, will need to monitor BMP for eliquis dosing - would currently qualify for 5 mg BID   Hx of PSVT - asymptomatic in the past - continue BB   Hyponatremia Hypomagnesemia - Na 118 - Mg 1.3 - replace Mg per primary - K 4.2   Renovascular hypertension - maintained at home on 0.2 mg clonidine daily (should be 0.1 mg BID), 50 mg metoprolol twice daily, 160 mg valsartan twice daily - hypertensive here, continue to monitor after morning medications   CAD s/p PCI to RCA (last PCI 2002_ Carotid artery disease, subclavian stenosis Hyperlipidemia with LDL goal < 70 - continue ASA and 40 mg lipitor, BB    CKD stage III - sCr stable, near baseline - monitor for eliquis dosing    Risk Assessment/Risk Scores:        CHA2DS2-VASc Score = 4 This indicates a 4.8% annual risk of stroke. The patient's score is  based upon: CHF History: 0 HTN History: 1 Diabetes History: 0 Stroke History: 0 Vascular Disease History: 1 Age Score: 2 Gender Score: 0       For questions or updates, please contact [facility] Please consult [email or web address] for contact info under    Signed, Ledora Bottcher, PA  10/10/2021 9:53 AM

## 2021-10-11 LAB — BASIC METABOLIC PANEL
Anion gap: 7 (ref 5–15)
BUN: 26 mg/dL — ABNORMAL HIGH (ref 8–23)
CO2: 23 mmol/L (ref 22–32)
Calcium: 7.7 mg/dL — ABNORMAL LOW (ref 8.9–10.3)
Chloride: 91 mmol/L — ABNORMAL LOW (ref 98–111)
Creatinine, Ser: 1.3 mg/dL — ABNORMAL HIGH (ref 0.61–1.24)
GFR, Estimated: 53 mL/min — ABNORMAL LOW (ref >60)
Glucose, Bld: 86 mg/dL (ref 70–99)
Potassium: 4.2 mmol/L (ref 3.5–5.1)
Sodium: 121 mmol/L — ABNORMAL LOW (ref 135–145)

## 2021-10-11 LAB — MAGNESIUM: Magnesium: 2.1 mg/dL (ref 1.7–2.4)

## 2021-10-11 LAB — TSH: TSH: 1.471 u[IU]/mL (ref 0.350–4.500)

## 2021-10-11 MED ORDER — ADULT MULTIVITAMIN W/MINERALS CH
1.0000 | ORAL_TABLET | Freq: Every day | ORAL | Status: DC
Start: 2021-10-11 — End: 2021-10-13
  Administered 2021-10-11 – 2021-10-13 (×3): 1 via ORAL
  Filled 2021-10-11 (×3): qty 1

## 2021-10-11 MED ORDER — SODIUM CHLORIDE 0.9 % IV SOLN: INTRAVENOUS | Status: AC

## 2021-10-11 MED ORDER — POLYETHYLENE GLYCOL 3350 17 G PO PACK
17.0000 g | PACK | Freq: Once | ORAL | Status: AC
  Administered 2021-10-11: 17 g via ORAL
  Filled 2021-10-11: qty 1

## 2021-10-11 MED ORDER — LEVALBUTEROL HCL 0.63 MG/3ML IN NEBU: 0.6300 mg | INHALATION_SOLUTION | Freq: Three times a day (TID) | RESPIRATORY_TRACT | Status: DC | PRN

## 2021-10-11 MED ORDER — ENSURE ENLIVE PO LIQD
237.0000 mL | Freq: Two times a day (BID) | ORAL | Status: DC
Start: 2021-10-11 — End: 2021-10-13
  Administered 2021-10-11 – 2021-10-13 (×3): 237 mL via ORAL

## 2021-10-11 NOTE — Progress Notes (Signed)
Initial Nutrition Assessment  INTERVENTION:   -Ensure Enlive po BID, each supplement provides 350 kcal and 20 grams of protein  -Multivitamin with minerals daily  NUTRITION DIAGNOSIS:   Increased nutrient needs related to acute illness as evidenced by estimated needs.  GOAL:   Patient will meet greater than or equal to 90% of their needs  MONITOR:   PO intake, Supplement acceptance, Labs, Weight trends, I & O's  REASON FOR ASSESSMENT:   Malnutrition Screening Tool    ASSESSMENT:   87 year old male with past medical history significant for essential hypertension, CAD, PAD, history of SVT, GERD, BPH who presented to [facility] ED on 12/7 with complaints of vomiting since last night.  Patient reports is dealing with flulike symptoms since Thanksgiving with associated runny nose, fever, headache, cough, body aches and sore throat.  He was diagnosed with influenza A by his PCP last week  Patient working with mobility specialist at time of visit. Will speak with pt at later date for history. Per chart review, pt was diagnosed with the flu last week, has had worsening cough and N/V PTA for 5 days.  PO has been poor as a result of symptoms.  Will order protein supplements.  Per weight records, pt weighed 167 lbs on 9/30. Current weight: 165 lbs.   Medications: Colace, Folic acid  Labs reviewed:  Low Na  NUTRITION - FOCUSED PHYSICAL EXAM:  Unable to complete, will attempt at follow-up  Diet Order:   Diet Order             DIET SOFT Room service appropriate? Yes; Fluid consistency: Thin  Diet effective now                   EDUCATION NEEDS:   No education needs have been identified at this time  Skin:  Skin Assessment: Reviewed RN Assessment  Last BM:  PTA  Height:   Ht Readings from Last 1 Encounters:  10/09/21 5\' 10"  (1.778 m)    Weight:   Wt Readings from Last 1 Encounters:  10/09/21 74.8 kg    BMI:  Body mass index is 23.68 kg/m.  Estimated  Nutritional Needs:   Kcal:  1850-2050  Protein:  100-110g  Fluid:  2L/day  Clayton Bibles, MS, RD, LDN Inpatient Clinical Dietitian Contact information available via Amion

## 2021-10-11 NOTE — Evaluation (Signed)
Physical Therapy One Time Evaluation Patient Details Name: Edward [name] MRN: [id number] DOB: 1929/12/27 Today's Date: 10/11/2021  History of Present Illness  87 year old male with past medical history significant for essential hypertension, CAD, PAD, history of SVT, GERD, BPH who presented to [facility] ED on 12/7 with complaints of vomiting since last night.  Pt admitted for Hypovolemic hyponatremia 2/2 hypovolemia versus medication side effect and Atrial fibrillation, new onset  Clinical Impression  Patient evaluated by Physical Therapy with no further acute PT needs identified. All education has been completed and the patient has no further questions.  Pt ambulated to bathroom and able to wash hands at sink with supervision. Pt ambulated good distance in hallway.  Pt typically very independent at baseline.  Pt encouraged to ambulate with nursing staff and/or mobility specialist during remainder of hospitalization however he is hopeful for d/c back to [facility] as soon as possible.  See below for any follow-up Physical Therapy or equipment needs. PT is signing off. Thank you for this referral.         Recommendations for follow up therapy are one component of a multi-disciplinary discharge planning process, led by the attending physician.  Recommendations may be updated based on patient status, additional functional criteria and insurance authorization.  Follow Up Recommendations No PT follow up    Assistance Recommended at Discharge None  Functional Status Assessment Patient has not had a recent decline in their functional status  Equipment Recommendations  None recommended by PT    Recommendations for Other Services       Precautions / Restrictions Precautions Precautions: Fall      Mobility  Bed Mobility Overal bed mobility: Modified Independent                  Transfers Overall transfer level: Needs assistance Equipment used: None Transfers: Sit to/from Stand Sit to Stand:  Min guard;Supervision           General transfer comment: verbal cues for hand placement    Ambulation/Gait Ambulation/Gait assistance: Min guard;Supervision Gait Distance (Feet): 240 Feet Assistive device: IV Pole Gait Pattern/deviations: Step-through pattern;Decreased stride length       General Gait Details: pt pushed IV pole, declines need for SPC upon d/c, slow but steady gait, HR 102 bpm in afib per telemetry upon return to room  Stairs            Wheelchair Mobility    Modified Rankin (Stroke Patients Only)       Balance Overall balance assessment: No apparent balance deficits (not formally assessed)                                           Pertinent Vitals/Pain Pain Assessment: No/denies pain    Home Living Family/patient expects to be discharged to:: Private residence Living Arrangements: Spouse/significant other   Type of Home: House Home Access: Stairs to enter Entrance Stairs-Rails: Right Entrance Stairs-Number of Steps: 2-3   Home Layout: One level Home Equipment: None      Prior Function Prior Level of Function : Independent/Modified Independent             Mobility Comments: pt very independent at baseline, 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       Hand Dominance        Extremity/Trunk Assessment  Lower Extremity Assessment Lower Extremity Assessment: Generalized weakness       Communication   Communication: HOH  Cognition Arousal/Alertness: Awake/alert Behavior During Therapy: WFL for tasks assessed/performed Overall Cognitive Status: Within Functional Limits for tasks assessed                                          General Comments      Exercises     Assessment/Plan    PT Assessment Patient does not need any further PT services  PT Problem List         PT Treatment Interventions      PT Goals (Current goals can be found in the  Care Plan section)  Acute Rehab PT Goals PT Goal Formulation: All assessment and education complete, DC therapy    Frequency     Barriers to discharge        Co-evaluation               AM-PAC PT "6 Clicks" Mobility  Outcome Measure Help needed turning from your back to your side while in a flat bed without using bedrails?: None Help needed moving from lying on your back to sitting on the side of a flat bed without using bedrails?: None Help needed moving to and from a bed to a chair (including a wheelchair)?: None Help needed standing up from a chair using your arms (e.g., wheelchair or bedside chair)?: None Help needed to walk in hospital room?: A Little Help needed climbing 3-5 steps with a railing? : A Little 6 Click Score: 22    End of Session   Activity Tolerance: Patient tolerated treatment well Patient left: with call bell/phone within reach;in bed;with family/visitor present Nurse Communication: Mobility status PT Visit Diagnosis: Difficulty in walking, not elsewhere classified (R26.2)    Time: 8099-8338 PT Time Calculation (min) (ACUTE ONLY): 19 min   Jannette Spanner PT, DPT Acute Rehabilitation Services Pager: [phone] Office: [place] 10/11/2021, 12:40 PM

## 2021-10-11 NOTE — Progress Notes (Signed)
PROGRESS NOTE    Edward [name]  IPJ:[id number] DOB: May 17, 1930 DOA: 10/09/2021 PCP: Mayra Neer, MD    Brief Narrative:  Edward [name] is an 87 year old male with past medical history significant for essential hypertension, CAD, PAD, history of SVT, GERD, BPH who presented to [facility] ED on 12/7 with complaints of vomiting since last night.  Patient reports is dealing with flulike symptoms since Thanksgiving with associated runny nose, fever, headache, cough, body aches and sore throat.  He was diagnosed with influenza A by his PCP last week but was out of the window to get Tamiflu.  He was then advised supportive care.  He reports continues with cough that is getting worse.  Denies any blood in his vomitus.  Has been using Mucinex which is not helping.  Also reports feeling very weak and unable to get out of bed.  Denies chest pain, no shortness of breath, no dizziness, no palpitations.  No recent travel or sick contacts.  In the ED, temperature 97.5 F, HR 91, RR 17, BP 203/120, SPO2 99% on room air.  Sodium 118, potassium 4.1, chloride 84, CO2 24, glucose 100, BUN 27, creatinine 1.29, AST 30, ALT 29, serum osmolality 252.  WBC 8.2, hemoglobin 11.8, platelets 245.  Influenza A PCR positive.  Influenza B PCR negative.  COVID-19 PCR negative.  Urinalysis unrevealing.  Chest x-ray with small right suprahilar opacity; no pleural effusion or pneumothorax.  EDP consulted [facility] for further evaluation and management of generalized weakness in the setting of hyponatremia with poor oral intake, AKI, poorly controlled hypertension and new onset atrial fibrillation.   Assessment & Plan:   Principal Problem:   Hyponatremia Active Problems:   Coronary atherosclerosis of native coronary artery   Mixed hyperlipidemia   Essential hypertension, benign   Renal artery stenosis (HCC)   Subclavian artery stenosis (HCC)   Hypovolemic hyponatremia 2/2 hypovolemia versus medication side effect Patient presenting  to ED with poor oral intake, recent diagnosis of influenza a outpatient with associated nausea and vomiting.  Sodium 118 on arrival.  Serum osmolality low, urine sodium 90, urine osmolality 430.  Suspect hypovolemic hyponatremia in the setting of poor oral intake and vomiting, also consideration of chlorthalidone use outpatient. --Na 118>>121 --Continue IV fluid hydration with NS at 125 mL/h --Discontinued chlorthalidone --Supportive care, antiemetics; encourage increased oral intake --Repeat BMP in a.m.  Atrial fibrillation, new onset Patient with previous history of SVT, follows with electrophysiology outpatient, Dr. Massie Maroon. Curt Bears.  EKG now with atrial fibrillation, likely related to acute illness from positive influenza versus electrolyte derangement.  Seen by cardiology and recommends initiation of anticoagulation with Eliquis and continue rate control strategy with metoprolol; and plan outpatient follow-up for consideration of cardioversion in 3-4 weeks if remains in A. fib.  CHADS2 VASc Score = 4.  TTE with LVEF 60 to 65%, no LV regional wall motion normalities, mild concentric LVH, diastolic function indeterminant, LA moderately dilated, RA mildly dilated, moderate TR, mild MR, IVC normal in size.  TSH 1.471, within normal limits. --Metoprolol tartrate 50mg  PO BID --Eliquis 5 mg p.o. twice daily --Continue monitor on telemetry  Hypomagnesemia: resolved Magnesium 2.1 this am --Repeat electrolytes in the a.m.  Recent influenza A viral infection Diagnosed by his PCP outpatient, was deemed out side the treatment window for Tamiflu.  Oxygenating well on room air. --Continue droplet precautions --Supportive care  Essential hypertension --Clonidine 0.1 mg twice daily --Metoprolol tartrate 50 mg p.o. twice daily --Irbesartan 37.5 mg Daily (substituted for home  valsartan) --Holding home chlorthalidone due to hyponatremia --Continue monitor BP closely  Hx SVT --Continue beta-blocker as  above --Monitor on telemetry  CAD s/p PCI RCA Hyperlipidemia --Continue aspirin 81 mg p.o. daily, atorvastatin 40 mg p.o. daily  CKD stage IIIb Baseline creatinine 1.4-1.7.  Currently at baseline. --Cr 1.29>1.27>1.43>1.29>1.24>1.30  --Avoid nephrotoxins, renal dose all medications --Repeat BMP in a.m.  BPH: --Tamsulosin 0.4 mg p.o. daily --Finasteride 5 mg p.o. daily  Weakness/debility/deconditioning: Seen by PT/OT with no follow-up recommended.   DVT prophylaxis:  apixaban (ELIQUIS) tablet 5 mg    Code Status: Full Code Family Communication: No family present at bedside this morning  Disposition Plan:  Level of care: Telemetry Status is: Inpatient  Remains inpatient appropriate because: Pending PT/OT evaluation, close monitoring of new onset atrial fibrillation.    Consultants:  Cardiology - signed off 12/8  Procedures:  TTE  Antimicrobials:  None   Subjective: Patient seen examined bedside, resting comfortably.  Sitting in bedside chair.  No specific complaints this morning.  Sodium slightly improved to 121 today.  No family present.  Continues on IV fluid hydration.  Patient with no questions or concerns at this time.  Denies headache, no dizziness, no chest pain, no shortness of breath, no abdominal pain.  No acute events overnight per nursing staff.  Objective: Vitals:   10/11/21 0109 10/11/21 0505 10/11/21 1050 10/11/21 1318  BP: 134/71 (!) 151/94 (!) 177/71 (!) 148/79  Pulse: 78 84 80 79  Resp: 14 16  18   Temp: (!) 97.3 F (36.3 C) 97.9 F (36.6 C)  97.9 F (36.6 C)  TempSrc: Oral Oral  Oral  SpO2: 98% 97% 97% 97%  Weight:      Height:        Intake/Output Summary (Last 24 hours) at 10/11/2021 1336 Last data filed at 10/11/2021 1104 Gross per 24 hour  Intake 2047.5 ml  Output 2000 ml  Net 47.5 ml   Filed Weights   10/09/21 1643  Weight: 74.8 kg    Examination:  General exam: Appears calm and comfortable; chronically ill in  appearance Respiratory system: Clear to auscultation. Respiratory effort normal.  On room air Cardiovascular system: S1 & S2 heard, irregularly irregular rhythm, normal rate. No JVD, murmurs, rubs, gallops or clicks. No pedal edema. Gastrointestinal system: Abdomen is nondistended, soft and nontender. No organomegaly or masses felt. Normal bowel sounds heard. Central nervous system: Alert and oriented. No focal neurological deficits. Extremities: Symmetric 5 x 5 power. Skin: No rashes, lesions or ulcers Psychiatry: Judgement and insight appear normal. Mood & affect appropriate.     Data Reviewed: I have personally reviewed following labs and imaging studies  CBC: Recent Labs  Lab 10/09/21 1231 10/09/21 1633 10/10/21 0533  WBC 8.2 7.2 5.9  NEUTROABS 6.1  --   --   HGB 11.8* 10.8* 10.1*  HCT 32.5* 29.3* 27.9*  MCV 85.5 86.2 88.3  PLT 245 244 563   Basic Metabolic Panel: Recent Labs  Lab 10/09/21 1633 10/09/21 2129 10/10/21 0315 10/10/21 0533 10/11/21 0725  NA 118* 119* 119* 118* 121*  K 3.8 4.3 4.1 4.2 4.2  CL 85* 87* 88* 87* 91*  CO2 24 23 23 24 23   GLUCOSE 132* 125* 92 95 86  BUN 25* 26* 27* 26* 26*  CREATININE 1.27* 1.43* 1.29* 1.24 1.30*  CALCIUM 8.1* 8.1* 7.6* 7.8* 7.7*  MG  --   --   --  1.3* 2.1  PHOS  --   --   --  2.9  --    GFR: Estimated Creatinine Clearance: 39.8 mL/min (A) (by C-G formula based on SCr of 1.3 mg/dL (H)). Liver Function Tests: Recent Labs  Lab 10/09/21 1231 10/10/21 0533  AST 30 28  ALT 29 24  ALKPHOS 92 76  BILITOT 1.0 0.8  PROT 7.0 5.7*  ALBUMIN 3.2* 2.6*   No results for input(s): LIPASE, AMYLASE in the last 168 hours. No results for input(s): AMMONIA in the last 168 hours. Coagulation Profile: No results for input(s): INR, PROTIME in the last 168 hours. Cardiac Enzymes: No results for input(s): CKTOTAL, CKMB, CKMBINDEX, TROPONINI in the last 168 hours. BNP (last 3 results) No results for input(s): PROBNP in the last 8760  hours. HbA1C: No results for input(s): HGBA1C in the last 72 hours. CBG: Recent Labs  Lab 10/09/21 1231  GLUCAP 117*   Lipid Profile: No results for input(s): CHOL, HDL, LDLCALC, TRIG, CHOLHDL, LDLDIRECT in the last 72 hours. Thyroid Function Tests: Recent Labs    10/11/21 0725  TSH 1.471   Anemia Panel: No results for input(s): VITAMINB12, FOLATE, FERRITIN, TIBC, IRON, RETICCTPCT in the last 72 hours. Sepsis Labs: No results for input(s): PROCALCITON, LATICACIDVEN in the last 168 hours.  Recent Results (from the past 240 hour(s))  Resp Panel by RT-PCR (Flu A&B, Covid) Nasopharyngeal Swab     Status: Abnormal   Collection Time: 10/09/21 10:05 AM   Specimen: Nasopharyngeal Swab; Nasopharyngeal(NP) swabs in vial transport medium  Result Value Ref Range Status   SARS Coronavirus 2 by RT PCR NEGATIVE NEGATIVE Final    Comment: (NOTE) SARS-CoV-2 target nucleic acids are NOT DETECTED.  The SARS-CoV-2 RNA is generally detectable in upper respiratory specimens during the acute phase of infection. The lowest concentration of SARS-CoV-2 viral copies this assay can detect is 138 copies/mL. A negative result does not preclude SARS-Cov-2 infection and should not be used as the sole basis for treatment or other patient management decisions. A negative result may occur with  improper specimen collection/handling, submission of specimen other than nasopharyngeal swab, presence of viral mutation(s) within the areas targeted by this assay, and inadequate number of viral copies(<138 copies/mL). A negative result must be combined with clinical observations, patient history, and epidemiological information. The expected result is Negative.  Fact Sheet for Patients:  EntrepreneurPulse.com.au  Fact Sheet for Healthcare Providers:  IncredibleEmployment.be  This test is no t yet approved or cleared by the [place] FDA and  has been authorized for  detection and/or diagnosis of SARS-CoV-2 by FDA under an Emergency Use Authorization (EUA). This EUA will remain  in effect (meaning this test can be used) for the duration of the COVID-19 declaration under Section 564(b)(1) of the Act, 21 U.S.C.section 360bbb-3(b)(1), unless the authorization is terminated  or revoked sooner.       Influenza A by PCR POSITIVE (A) NEGATIVE Final   Influenza B by PCR NEGATIVE NEGATIVE Final    Comment: (NOTE) The Xpert Xpress SARS-CoV-2/FLU/RSV plus assay is intended as an aid in the diagnosis of influenza from Nasopharyngeal swab specimens and should not be used as a sole basis for treatment. Nasal washings and aspirates are unacceptable for Xpert Xpress SARS-CoV-2/FLU/RSV testing.  Fact Sheet for Patients: EntrepreneurPulse.com.au  Fact Sheet for Healthcare Providers: IncredibleEmployment.be  This test is not yet approved or cleared by the [place] FDA and has been authorized for detection and/or diagnosis of SARS-CoV-2 by FDA under an Emergency Use Authorization (EUA). This EUA will remain in effect (meaning this  test can be used) for the duration of the COVID-19 declaration under Section 564(b)(1) of the Act, 21 U.S.C. section 360bbb-3(b)(1), unless the authorization is terminated or revoked.  Performed at [facility], [place] [address], [place], [place] [address]          Radiology Studies: ECHOCARDIOGRAM COMPLETE  Result Date: 10/10/2021    ECHOCARDIOGRAM REPORT   Patient Name:   HARI CASAUS [facility] Date of Exam: 10/10/2021 Medical Rec #:  [id number]      Height:       70.0 in Accession #:    [id number]     Weight:       165.0 lb Date of Birth:  [birth year]     BSA:          1.923 m Patient Age:    73 years       BP:           157/88 mmHg Patient Gender: M              HR:           101 bpm. Exam Location:  Inpatient Procedure: 2D Echo, Color Doppler and Cardiac Doppler Indications:     Abnormal EKG  History:        Patient has prior history of Echocardiogram examinations. Risk                 Factors:Hypertension.  Sonographer:    Jyl Heinz Referring Phys: Amorita  1. Left ventricular ejection fraction, by estimation, is 60 to 65%. The left ventricle has normal function. The left ventricle has no regional wall motion abnormalities. There is mild concentric left ventricular hypertrophy. Diastolic function is indeterminant due to atrial fibrillation.  2. Right ventricular systolic function is normal. The right ventricular size is normal. There is mildly elevated pulmonary artery systolic pressure. The estimated right ventricular systolic pressure is 56.8 mmHg.  3. Left atrial size was moderately dilated.  4. Right atrial size was mildly dilated.  5. The mitral valve is normal in structure. Mild mitral valve regurgitation.  6. Tricuspid valve regurgitation is moderate.  7. The aortic valve is tricuspid. There is mild calcification of the aortic valve. There is mild thickening of the aortic valve. Aortic valve regurgitation is trivial.  8. The inferior vena cava is normal in size with greater than 50% respiratory variability, suggesting right atrial pressure of 3 mmHg. Comparison(s): Compared to prior TTE in 01/2021, the patient is now in AFib. Otherwise, there is no significant change. FINDINGS  Left Ventricle: Left ventricular ejection fraction, by estimation, is 60 to 65%. The left ventricle has normal function. The left ventricle has no regional wall motion abnormalities. The left ventricular internal cavity size was normal in size. There is  mild concentric left ventricular hypertrophy. Diastolic function is indeterminant due to atrial fibrillation. Right Ventricle: The right ventricular size is normal. No increase in right ventricular wall thickness. Right ventricular systolic function is normal. There is mildly elevated pulmonary artery systolic pressure. The  tricuspid regurgitant velocity is 3.24  m/s, and with an assumed right atrial pressure of 3 mmHg, the estimated right ventricular systolic pressure is 12.7 mmHg. Left Atrium: Left atrial size was moderately dilated. Right Atrium: Right atrial size was mildly dilated. Pericardium: There is no evidence of pericardial effusion. Mitral Valve: The mitral valve is normal in structure. There is mild thickening of the mitral valve leaflet(s). There is mild calcification of the mitral valve leaflet(s). Mild  mitral annular calcification. Mild mitral valve regurgitation. Tricuspid Valve: The tricuspid valve is normal in structure. Tricuspid valve regurgitation is moderate. Aortic Valve: The aortic valve is tricuspid. There is mild calcification of the aortic valve. There is mild thickening of the aortic valve. Aortic valve regurgitation is trivial. Aortic valve peak gradient measures 5.9 mmHg. Pulmonic Valve: The pulmonic valve was normal in structure. Pulmonic valve regurgitation is trivial. Aorta: The aortic root and ascending aorta are structurally normal, with no evidence of dilitation. Venous: The inferior vena cava is normal in size with greater than 50% respiratory variability, suggesting right atrial pressure of 3 mmHg. IAS/Shunts: No atrial level shunt detected by color flow Doppler.  LEFT VENTRICLE PLAX 2D LVIDd:         4.50 cm     Diastology LVIDs:         2.90 cm     LV e' medial:    6.31 cm/s LV PW:         1.20 cm     LV E/e' medial:  15.1 LV IVS:        1.40 cm     LV e' lateral:   8.27 cm/s LVOT diam:     2.00 cm     LV E/e' lateral: 11.5 LV SV:         39 LV SV Index:   20 LVOT Area:     3.14 cm  LV Volumes (MOD) LV vol d, MOD A2C: 56.2 ml LV vol d, MOD A4C: 62.3 ml LV vol s, MOD A2C: 25.9 ml LV vol s, MOD A4C: 30.4 ml LV SV MOD A2C:     30.3 ml LV SV MOD A4C:     62.3 ml LV SV MOD BP:      30.9 ml RIGHT VENTRICLE            IVC RV Basal diam:  3.90 cm    IVC diam: 1.50 cm RV Mid diam:    2.70 cm RV S prime:      7.40 cm/s TAPSE (M-mode): 1.9 cm LEFT ATRIUM             Index        RIGHT ATRIUM           Index LA diam:        4.30 cm 2.24 cm/m   RA Area:     22.80 cm LA Vol (A2C):   79.8 ml 41.49 ml/m  RA Volume:   66.50 ml  34.57 ml/m LA Vol (A4C):   69.5 ml 36.13 ml/m LA Biplane Vol: 77.3 ml 40.19 ml/m  AORTIC VALVE AV Area (Vmax): 1.71 cm AV Vmax:        121.00 cm/s AV Peak Grad:   5.9 mmHg LVOT Vmax:      65.90 cm/s LVOT Vmean:     47.800 cm/s LVOT VTI:       0.124 m  AORTA Ao Root diam: 2.70 cm Ao Asc diam:  2.80 cm MITRAL VALVE               TRICUSPID VALVE MV Area (PHT): 5.06 cm    TR Peak grad:   42.0 mmHg MV Decel Time: 150 msec    TR Vmax:        324.00 cm/s MV E velocity: 95.50 cm/s MV A velocity: 33.00 cm/s  SHUNTS MV E/A ratio:  2.89        Systemic VTI:  0.12 m  Systemic Diam: 2.00 cm Gwyndolyn Kaufman MD Electronically signed by Gwyndolyn Kaufman MD Signature Date/Time: 10/10/2021/1:09:35 PM    Final         Scheduled Meds:  apixaban  5 mg Oral BID   aspirin EC  81 mg Oral Daily   atorvastatin  40 mg Oral Daily   cloNIDine  0.1 mg Oral BID   docusate sodium  100 mg Oral BID   finasteride  5 mg Oral Daily   folic acid  1 mg Oral Daily   guaiFENesin  600 mg Oral BID   irbesartan  37.5 mg Oral Daily   LORazepam  1 mg Oral BID   metoprolol tartrate  50 mg Oral BID   pantoprazole  40 mg Oral Daily   tamsulosin  0.4 mg Oral Daily   Continuous Infusions:  sodium chloride 125 mL/hr at 10/11/21 1048     LOS: 2 days    Time spent: 41 minutes spent on chart review, discussion with nursing staff, consultants, updating family and interview/physical exam; more than 50% of that time was spent in counseling and/or coordination of care.    Eisa Conaway J [place] DO [facility] Available via Epic secure chat 7am-7pm After these hours, please refer to coverage provider listed on [email or web address] 10/11/2021, 1:36 PM

## 2021-10-11 NOTE — Progress Notes (Signed)
   10/11/21 1325  Mobility  Activity Ambulated in hall  Level of Assistance Contact guard assist, steadying assist  Assistive Device Other (Comment) (IV pole)  Distance Ambulated (ft) 250 ft  Mobility Ambulated with assistance in hallway  Mobility Response Tolerated well  Mobility performed by Mobility specialist  $Mobility charge 1 Mobility   Pt agreeable to mobilize this afternoon. Ambulated about 268ft with IV pole, tolerated well. No complaints. Pt left in chair with daughter present and call bell at side. NT notified of session.   [place] Specialist Acute Rehab Services Office: [phone]

## 2021-10-11 NOTE — Evaluation (Signed)
Occupational Therapy Evaluation Patient Details Name: Edward [name] MRN: [id number] DOB: 1930/07/21 Today's Date: 10/11/2021   History of Present Illness 87 year old male with past medical history significant for essential hypertension, CAD, PAD, history of SVT, GERD, BPH who presented to [facility] ED on 12/7 with complaints of vomiting since last night.  Pt admitted for Hypovolemic hyponatremia 2/2 hypovolemia versus medication side effect and Atrial fibrillation, new onset   Clinical Impression   Patient evaluated by Occupational Therapy with no further acute OT needs identified. All education has been completed and the patient has no further questions.  See below for any follow-up Occupational Therapy or equipment needs. OT is signing off. Thank you for this referral.       Recommendations for follow up therapy are one component of a multi-disciplinary discharge planning process, led by the attending physician.  Recommendations may be updated based on patient status, additional functional criteria and insurance authorization.   Follow Up Recommendations  No OT follow up    Assistance Recommended at Discharge Intermittent Supervision/Assistance  Functional Status Assessment  Patient has had a recent decline in their functional status and demonstrates the ability to make significant improvements in function in a reasonable and predictable amount of time.  Equipment Recommendations  None recommended by OT    Recommendations for Other Services       Precautions / Restrictions Precautions Precautions: Fall Restrictions Weight Bearing Restrictions: No      Mobility Bed Mobility Overal bed mobility: Modified Independent             General bed mobility comments: Pt up in recliner    Transfers Overall transfer level: Needs assistance Equipment used: None Transfers: Sit to/from Stand Sit to Stand: Supervision           General transfer comment: Min guard for shower  transfer.      Balance Overall balance assessment: Mild deficits observed, not formally tested                                         ADL either performed or assessed with clinical judgement   ADL Overall ADL's : At baseline                                       General ADL Comments: Pt able to demosntrate LE dressing, standing at sink, toileting (simulated) and toilet transfer to standard height toilet as well as shower transfer. Pt with supervision to Mod I ambulating in room without AD. Pt required 2 instances of Min guard for safety due to mild unsteadiness when exiting shower, but otherwise no hands-on assistance.  Pt's daughter present and observing all activities.     Vision Baseline Vision/History: 1 Wears glasses Ability to See in Adequate Light: 0 Adequate Vision Assessment?: No apparent visual deficits     Perception     Praxis      Pertinent Vitals/Pain Pain Assessment: No/denies pain     Hand Dominance     Extremity/Trunk Assessment Upper Extremity Assessment Upper Extremity Assessment: Overall WFL for tasks assessed   Lower Extremity Assessment Lower Extremity Assessment: Generalized weakness   Cervical / Trunk Assessment Cervical / Trunk Assessment: Normal   Communication Communication Communication: HOH   Cognition Arousal/Alertness: Awake/alert Behavior During Therapy: WFL for tasks assessed/performed Overall Cognitive  Status: Within Functional Limits for tasks assessed                                       General Comments       Exercises     Shoulder Instructions      Home Living Family/patient expects to be discharged to:: Private residence Living Arrangements: Spouse/significant other   Type of Home: House Home Access: Stairs to enter Technical brewer of Steps: 2-3 Entrance Stairs-Rails: Right Home Layout: One level     Bathroom Shower/Tub: Radiographer, therapeutic: [place]: None          Prior Functioning/Environment Prior Level of Function : Independent/Modified Independent             Mobility Comments: pt very independent at baseline, 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          OT Problem List: Decreased activity tolerance      OT Treatment/Interventions:      OT Goals(Current goals can be found in the care plan section) Acute Rehab OT Goals OT Goal Formulation: All assessment and education complete, DC therapy Potential to Achieve Goals: Good  OT Frequency:     Barriers to D/C:            Co-evaluation              AM-PAC OT "6 Clicks" Daily Activity     Outcome Measure Help from another person eating meals?: None Help from another person taking care of personal grooming?: None Help from another person toileting, which includes using toliet, bedpan, or urinal?: None Help from another person bathing (including washing, rinsing, drying)?: A Little Help from another person to put on and taking off regular upper body clothing?: None Help from another person to put on and taking off regular lower body clothing?: None 6 Click Score: 23   End of Session Equipment Utilized During Treatment: Gait belt  Activity Tolerance: Patient tolerated treatment well Patient left: in chair;with family/visitor present;with call bell/phone within reach  OT Visit Diagnosis:  (Decreased ADLs)                Time: 6468-0321 OT Time Calculation (min): 14 min Charges:  OT General Charges $OT Visit: 1 Visit OT Evaluation $OT Eval Low Complexity: 1 Low  Alycen Mack, [place] Office: [phone] 10/11/2021  Julien Girt 10/11/2021, 1:08 PM

## 2021-10-11 NOTE — Care Management Important Message (Signed)
Important Message  Patient Details IM Letter placed in Patients room. Name: Edward [name] MRN: [id number] Date of Birth: [birth year]   Medicare Important Message Given:  Yes     Kerin Salen 10/11/2021, 10:12 AM

## 2021-10-12 ENCOUNTER — Inpatient Hospital Stay (HOSPITAL_COMMUNITY): Payer: Medicare Other

## 2021-10-12 LAB — BASIC METABOLIC PANEL
Anion gap: 6 (ref 5–15)
BUN: 29 mg/dL — ABNORMAL HIGH (ref 8–23)
CO2: 24 mmol/L (ref 22–32)
Calcium: 7.8 mg/dL — ABNORMAL LOW (ref 8.9–10.3)
Chloride: 93 mmol/L — ABNORMAL LOW (ref 98–111)
Creatinine, Ser: 1.37 mg/dL — ABNORMAL HIGH (ref 0.61–1.24)
GFR, Estimated: 49 mL/min — ABNORMAL LOW (ref >60)
Glucose, Bld: 88 mg/dL (ref 70–99)
Potassium: 4.8 mmol/L (ref 3.5–5.1)
Sodium: 123 mmol/L — ABNORMAL LOW (ref 135–145)

## 2021-10-12 MED ORDER — SENNOSIDES-DOCUSATE SODIUM 8.6-50 MG PO TABS
2.0000 | ORAL_TABLET | Freq: Two times a day (BID) | ORAL | Status: DC
  Administered 2021-10-12 – 2021-10-13 (×3): 2 via ORAL
  Filled 2021-10-12 (×3): qty 2

## 2021-10-12 MED ORDER — IRBESARTAN 75 MG PO TABS
75.0000 mg | ORAL_TABLET | Freq: Every day | ORAL | Status: DC
  Administered 2021-10-12 – 2021-10-13 (×2): 75 mg via ORAL
  Filled 2021-10-12 (×2): qty 1

## 2021-10-12 MED ORDER — METOPROLOL TARTRATE 50 MG PO TABS
75.0000 mg | ORAL_TABLET | Freq: Two times a day (BID) | ORAL | Status: DC
  Administered 2021-10-12 – 2021-10-13 (×3): 75 mg via ORAL
  Filled 2021-10-12 (×3): qty 1

## 2021-10-12 MED ORDER — SODIUM CHLORIDE 0.9 % IV SOLN: INTRAVENOUS | Status: AC

## 2021-10-12 MED ORDER — POLYETHYLENE GLYCOL 3350 17 G PO PACK
17.0000 g | PACK | Freq: Two times a day (BID) | ORAL | Status: DC
  Administered 2021-10-12 – 2021-10-13 (×3): 17 g via ORAL
  Filled 2021-10-12 (×3): qty 1

## 2021-10-12 NOTE — Progress Notes (Signed)
PROGRESS NOTE    Edward [name]  WUJ:[id number] DOB: [birth year] DOA: 10/09/2021 PCP: Mayra Neer, MD    Brief Narrative:  Edward [name] is an 87 year old male with past medical history significant for essential hypertension, CAD, PAD, history of SVT, GERD, BPH who presented to [facility] ED on 12/7 with complaints of vomiting since last night.  Patient reports is dealing with flulike symptoms since Thanksgiving with associated runny nose, fever, headache, cough, body aches and sore throat.  He was diagnosed with influenza A by his PCP last week but was out of the window to get Tamiflu.  He was then advised supportive care.  He reports continues with cough that is getting worse.  Denies any blood in his vomitus.  Has been using Mucinex which is not helping.  Also reports feeling very weak and unable to get out of bed.  Denies chest pain, no shortness of breath, no dizziness, no palpitations.  No recent travel or sick contacts.  In the ED, temperature 97.5 F, HR 91, RR 17, BP 203/120, SPO2 99% on room air.  Sodium 118, potassium 4.1, chloride 84, CO2 24, glucose 100, BUN 27, creatinine 1.29, AST 30, ALT 29, serum osmolality 252.  WBC 8.2, hemoglobin 11.8, platelets 245.  Influenza A PCR positive.  Influenza B PCR negative.  COVID-19 PCR negative.  Urinalysis unrevealing.  Chest x-ray with small right suprahilar opacity; no pleural effusion or pneumothorax.  EDP consulted [facility] for further evaluation and management of generalized weakness in the setting of hyponatremia with poor oral intake, AKI, poorly controlled hypertension and new onset atrial fibrillation.   Assessment & Plan:   Principal Problem:   Hyponatremia Active Problems:   Coronary atherosclerosis of native coronary artery   Mixed hyperlipidemia   Essential hypertension, benign   Renal artery stenosis (HCC)   Subclavian artery stenosis (HCC)   Hypovolemic hyponatremia 2/2 hypovolemia versus medication side effect Patient presenting  to ED with poor oral intake, recent diagnosis of influenza a outpatient with associated nausea and vomiting.  Sodium 118 on arrival.  Serum osmolality low, urine sodium 90, urine osmolality 430.  Suspect hypovolemic hyponatremia in the setting of poor oral intake and vomiting, also consideration of chlorthalidone use outpatient. --Na 118>>121>123 --Continue IV fluid hydration with NS at 100 mL/h --Discontinued chlorthalidone --Supportive care, antiemetics; encourage increased oral intake --Repeat BMP in a.m.  Atrial fibrillation, new onset Patient with previous history of SVT, follows with electrophysiology outpatient, Dr. Massie Maroon. Curt Bears.  EKG now with atrial fibrillation, likely related to acute illness from positive influenza versus electrolyte derangement.  Seen by cardiology and recommends initiation of anticoagulation with Eliquis and continue rate control strategy with metoprolol; and plan outpatient follow-up for consideration of cardioversion in 3-4 weeks if remains in A. fib.  CHADS2 VASc Score = 4.  TTE with LVEF 60 to 65%, no LV regional wall motion normalities, mild concentric LVH, diastolic function indeterminant, LA moderately dilated, RA mildly dilated, moderate TR, mild MR, IVC normal in size.  TSH 1.471, within normal limits. --Metoprolol tartrate 75mg  PO BID --Eliquis 5 mg p.o. twice daily --Continue monitor on telemetry  Hypomagnesemia: resolved Magnesium 2.1 this am --Repeat electrolytes in the a.m.  Recent influenza A viral infection Diagnosed by his PCP outpatient, was deemed out side the treatment window for Tamiflu.  Oxygenating well on room air. --Continue droplet precautions --Supportive care  Essential hypertension --Clonidine 0.1 mg twice daily --Metoprolol tartrate 75 mg p.o. twice daily --Irbesartan 75 mg Daily (substituted for home  valsartan) --Holding home chlorthalidone due to hyponatremia --Continue monitor BP closely  Hx SVT --Continue beta-blocker  as above --Monitor on telemetry  CAD s/p PCI RCA Hyperlipidemia --Continue aspirin 81 mg p.o. daily, atorvastatin 40 mg p.o. daily  CKD stage IIIb Baseline creatinine 1.4-1.7.  Currently at baseline. --Cr 1.29>1.27>1.43>1.29>1.24>1.30>1.37  --Avoid nephrotoxins, renal dose all medications --Repeat BMP in a.m.  BPH: --Tamsulosin 0.4 mg p.o. daily --Finasteride 5 mg p.o. daily  Weakness/debility/deconditioning: Seen by PT/OT with no follow-up recommended.   DVT prophylaxis:  apixaban (ELIQUIS) tablet 5 mg    Code Status: Full Code Family Communication: No family present at bedside this morning  Disposition Plan:  Level of care: Telemetry Status is: Inpatient  Remains inpatient appropriate because: Continues on IV fluid hydration for hyponatremia, not close to his baseline as of yet; hopefully discharge home in 1-2 days  Consultants:  Cardiology - signed off 12/8  Procedures:  TTE  Antimicrobials:  None   Subjective: Patient seen examined bedside, resting comfortably.  In bed.  Upset that his breakfast is cold.  Overnight, nursing concerned about some wheezing, received neb treatments and IV fluids were titrated down.  Chest x-ray this morning looks clear and okay to continue IV fluid hydration for hypovolemic hyponatremia.  Discussed with patient hopefully can discharge in the next 1 or 2 days if sodium continues to trend up.  No family present.  Patient with no questions or concerns at this time.  Denies headache, no dizziness, no chest pain, no shortness of breath, no abdominal pain.  No acute events overnight per nursing staff.  Objective: Vitals:   10/11/21 1318 10/11/21 2119 10/12/21 0455 10/12/21 1102  BP: (!) 148/79 (!) 190/88 (!) 154/78 (!) 169/103  Pulse: 79 88 83 79  Resp: 18 18 16    Temp: 97.9 F (36.6 C) 98.2 F (36.8 C) 98.3 F (36.8 C)   TempSrc: Oral Oral Oral   SpO2: 97% 97% 97%   Weight:      Height:        Intake/Output Summary (Last 24  hours) at 10/12/2021 1202 Last data filed at 10/12/2021 0900 Gross per 24 hour  Intake 757.13 ml  Output 1200 ml  Net -442.87 ml   Filed Weights   10/09/21 1643  Weight: 74.8 kg    Examination:  General exam: Appears calm and comfortable; chronically ill in appearance Respiratory system: Clear to auscultation. Respiratory effort normal.  On room air Cardiovascular system: S1 & S2 heard, irregularly irregular rhythm, normal rate. No JVD, murmurs, rubs, gallops or clicks. No pedal edema. Gastrointestinal system: Abdomen is nondistended, soft and nontender. No organomegaly or masses felt. Normal bowel sounds heard. Central nervous system: Alert and oriented. No focal neurological deficits. Extremities: Symmetric 5 x 5 power. Skin: No rashes, lesions or ulcers Psychiatry: Judgement and insight appear normal. Mood & affect appropriate.     Data Reviewed: I have personally reviewed following labs and imaging studies  CBC: Recent Labs  Lab 10/09/21 1231 10/09/21 1633 10/10/21 0533  WBC 8.2 7.2 5.9  NEUTROABS 6.1  --   --   HGB 11.8* 10.8* 10.1*  HCT 32.5* 29.3* 27.9*  MCV 85.5 86.2 88.3  PLT 245 244 322   Basic Metabolic Panel: Recent Labs  Lab 10/09/21 2129 10/10/21 0315 10/10/21 0533 10/11/21 0725 10/12/21 0631  NA 119* 119* 118* 121* 123*  K 4.3 4.1 4.2 4.2 4.8  CL 87* 88* 87* 91* 93*  CO2 23 23 24 23 24   GLUCOSE 125*  92 95 86 88  BUN 26* 27* 26* 26* 29*  CREATININE 1.43* 1.29* 1.24 1.30* 1.37*  CALCIUM 8.1* 7.6* 7.8* 7.7* 7.8*  MG  --   --  1.3* 2.1  --   PHOS  --   --  2.9  --   --    GFR: Estimated Creatinine Clearance: 37.7 mL/min (A) (by C-G formula based on SCr of 1.37 mg/dL (H)). Liver Function Tests: Recent Labs  Lab 10/09/21 1231 10/10/21 0533  AST 30 28  ALT 29 24  ALKPHOS 92 76  BILITOT 1.0 0.8  PROT 7.0 5.7*  ALBUMIN 3.2* 2.6*   No results for input(s): LIPASE, AMYLASE in the last 168 hours. No results for input(s): AMMONIA in the last  168 hours. Coagulation Profile: No results for input(s): INR, PROTIME in the last 168 hours. Cardiac Enzymes: No results for input(s): CKTOTAL, CKMB, CKMBINDEX, TROPONINI in the last 168 hours. BNP (last 3 results) No results for input(s): PROBNP in the last 8760 hours. HbA1C: No results for input(s): HGBA1C in the last 72 hours. CBG: Recent Labs  Lab 10/09/21 1231  GLUCAP 117*   Lipid Profile: No results for input(s): CHOL, HDL, LDLCALC, TRIG, CHOLHDL, LDLDIRECT in the last 72 hours. Thyroid Function Tests: Recent Labs    10/11/21 0725  TSH 1.471   Anemia Panel: No results for input(s): VITAMINB12, FOLATE, FERRITIN, TIBC, IRON, RETICCTPCT in the last 72 hours. Sepsis Labs: No results for input(s): PROCALCITON, LATICACIDVEN in the last 168 hours.  Recent Results (from the past 240 hour(s))  Resp Panel by RT-PCR (Flu A&B, Covid) Nasopharyngeal Swab     Status: Abnormal   Collection Time: 10/09/21 10:05 AM   Specimen: Nasopharyngeal Swab; Nasopharyngeal(NP) swabs in vial transport medium  Result Value Ref Range Status   SARS Coronavirus 2 by RT PCR NEGATIVE NEGATIVE Final    Comment: (NOTE) SARS-CoV-2 target nucleic acids are NOT DETECTED.  The SARS-CoV-2 RNA is generally detectable in upper respiratory specimens during the acute phase of infection. The lowest concentration of SARS-CoV-2 viral copies this assay can detect is 138 copies/mL. A negative result does not preclude SARS-Cov-2 infection and should not be used as the sole basis for treatment or other patient management decisions. A negative result may occur with  improper specimen collection/handling, submission of specimen other than nasopharyngeal swab, presence of viral mutation(s) within the areas targeted by this assay, and inadequate number of viral copies(<138 copies/mL). A negative result must be combined with clinical observations, patient history, and epidemiological information. The expected result is  Negative.  Fact Sheet for Patients:  EntrepreneurPulse.com.au  Fact Sheet for Healthcare Providers:  IncredibleEmployment.be  This test is no t yet approved or cleared by the [place] FDA and  has been authorized for detection and/or diagnosis of SARS-CoV-2 by FDA under an Emergency Use Authorization (EUA). This EUA will remain  in effect (meaning this test can be used) for the duration of the COVID-19 declaration under Section 564(b)(1) of the Act, 21 U.S.C.section 360bbb-3(b)(1), unless the authorization is terminated  or revoked sooner.       Influenza A by PCR POSITIVE (A) NEGATIVE Final   Influenza B by PCR NEGATIVE NEGATIVE Final    Comment: (NOTE) The Xpert Xpress SARS-CoV-2/FLU/RSV plus assay is intended as an aid in the diagnosis of influenza from Nasopharyngeal swab specimens and should not be used as a sole basis for treatment. Nasal washings and aspirates are unacceptable for Xpert Xpress SARS-CoV-2/FLU/RSV testing.  Fact Sheet for  Patients: EntrepreneurPulse.com.au  Fact Sheet for Healthcare Providers: IncredibleEmployment.be  This test is not yet approved or cleared by the [place] FDA and has been authorized for detection and/or diagnosis of SARS-CoV-2 by FDA under an Emergency Use Authorization (EUA). This EUA will remain in effect (meaning this test can be used) for the duration of the COVID-19 declaration under Section 564(b)(1) of the Act, 21 U.S.C. section 360bbb-3(b)(1), unless the authorization is terminated or revoked.  Performed at [facility], [place] [address] [place], [place] [address]          Radiology Studies: DG CHEST PORT 1 VIEW  Result Date: 10/12/2021 CLINICAL DATA:  Short of breath. EXAM: PORTABLE CHEST 1 VIEW COMPARISON:  10/09/2021 FINDINGS: Right suprahilar nodular density is less apparent on today's study. Just above this are  some calcified granulomata. Heart size and vascularity normal.  No infiltrate or effusion. IMPRESSION: No acute abnormality. Improvement in right upper lobe nodular density which may have been atelectasis. Electronically Signed   By: Franchot Gallo M.D.   On: 10/12/2021 10:34        Scheduled Meds:  apixaban  5 mg Oral BID   aspirin EC  81 mg Oral Daily   atorvastatin  40 mg Oral Daily   cloNIDine  0.1 mg Oral BID   feeding supplement  237 mL Oral BID BM   finasteride  5 mg Oral Daily   folic acid  1 mg Oral Daily   guaiFENesin  600 mg Oral BID   irbesartan  75 mg Oral Daily   LORazepam  1 mg Oral BID   metoprolol tartrate  75 mg Oral BID   multivitamin with minerals  1 tablet Oral Daily   pantoprazole  40 mg Oral Daily   polyethylene glycol  17 g Oral BID   senna-docusate  2 tablet Oral BID   tamsulosin  0.4 mg Oral Daily   Continuous Infusions:  sodium chloride 100 mL/hr at 10/12/21 1115     LOS: 3 days    Time spent: 39 minutes spent on chart review, discussion with nursing staff, consultants, updating family and interview/physical exam; more than 50% of that time was spent in counseling and/or coordination of care.    Gracyn Santillanes J [place] DO [facility] Available via Epic secure chat 7am-7pm After these hours, please refer to coverage provider listed on [email or web address] 10/12/2021, 12:02 PM

## 2021-10-12 NOTE — TOC CM/SW Note (Signed)
  Transition of Care [facility]) Screening Note   Patient Details  Name: Edward [name] Date of Birth: 06-09-1930   Transition of Care [facility]) CM/SW Contact:    Ross Ludwig, LCSW Phone Number: 10/12/2021, 5:24 PM    Transition of Care Department [facility]) has reviewed patient and no TOC needs have been identified at this time. We will continue to monitor patient advancement through interdisciplinary progression rounds. If new patient transition needs arise, please place a TOC consult.

## 2021-10-12 NOTE — Progress Notes (Signed)
   10/12/21 1200  Mobility  Activity Ambulated in hall  Level of Assistance Standby assist, set-up cues, supervision of patient - no hands on  Assistive Device Other (Comment) (IV pole)  Distance Ambulated (ft) 300 ft  Mobility Ambulated with assistance in hallway  Mobility Response Tolerated well  Mobility performed by Mobility specialist  $Mobility charge 1 Mobility   Pt agreeable to mobilize this morning. Pt HOH, and completely deaf in left ear. He ambulated about 3100ft in hall with IV pole, tolerated well. Upon return, pt requested to sit up for lunch. Left in chair with call bell at side. RN notified of session.  [place] Specialist Acute Rehab Services Office: [phone]

## 2021-10-13 LAB — BASIC METABOLIC PANEL
Anion gap: 6 (ref 5–15)
BUN: 26 mg/dL — ABNORMAL HIGH (ref 8–23)
CO2: 24 mmol/L (ref 22–32)
Calcium: 8 mg/dL — ABNORMAL LOW (ref 8.9–10.3)
Chloride: 94 mmol/L — ABNORMAL LOW (ref 98–111)
Creatinine, Ser: 1.26 mg/dL — ABNORMAL HIGH (ref 0.61–1.24)
GFR, Estimated: 55 mL/min — ABNORMAL LOW (ref >60)
Glucose, Bld: 82 mg/dL (ref 70–99)
Potassium: 4.3 mmol/L (ref 3.5–5.1)
Sodium: 124 mmol/L — ABNORMAL LOW (ref 135–145)

## 2021-10-13 MED ORDER — HYDRALAZINE HCL 25 MG PO TABS: 25.0000 mg | ORAL_TABLET | Freq: Four times a day (QID) | ORAL | Status: DC | PRN

## 2021-10-13 MED ORDER — METOPROLOL TARTRATE 75 MG PO TABS: 75.0000 mg | ORAL_TABLET | Freq: Two times a day (BID) | ORAL | 2 refills | Status: DC

## 2021-10-13 MED ORDER — APIXABAN 5 MG PO TABS: 5.0000 mg | ORAL_TABLET | Freq: Two times a day (BID) | ORAL | 2 refills | Status: DC

## 2021-10-13 NOTE — Progress Notes (Signed)
Discharge paperwork reviewed with patient and patients daughter. Both verbalized full understanding of instructions including medications. Pt assisted to car via wheelchair.

## 2021-10-13 NOTE — Discharge Summary (Signed)
Physician Discharge Summary  Edward [name] VZD:[id number] DOB: 1930/08/12 DOA: 10/09/2021  PCP: Mayra Neer, MD  Admit date: 10/09/2021 Discharge date: 10/13/2021  Admitted From: Home Disposition: Home  Recommendations for Outpatient Follow-up:  Follow up with PCP in 1-2 weeks Follow-up with cardiology as scheduled on 10/23/2021 and 11/14/2021 Increase metoprolol to 75 mg p.o. twice daily for rate control of atrial fibrillation Discontinue chlorthalidone due to hyponatremia Please obtain BMP in one week to assess sodium level  Home Health: No Equipment/Devices: None  Discharge Condition: Stable CODE STATUS: Full code Diet recommendation: Regular diet  History of present illness:  Edward [name] is an 87 year old male with past medical history significant for essential hypertension, CAD, PAD, history of SVT, GERD, BPH who presented to [facility] ED on 12/7 with complaints of vomiting since last night.  Patient reports is dealing with flulike symptoms since Thanksgiving with associated runny nose, fever, headache, cough, body aches and sore throat.  He was diagnosed with influenza A by his PCP last week but was out of the window to get Tamiflu.  He was then advised supportive care.  He reports continues with cough that is getting worse.  Denies any blood in his vomitus.  Has been using Mucinex which is not helping.  Also reports feeling very weak and unable to get out of bed.  Denies chest pain, no shortness of breath, no dizziness, no palpitations.  No recent travel or sick contacts.   In the ED, temperature 97.5 F, HR 91, RR 17, BP 203/120, SPO2 99% on room air.  Sodium 118, potassium 4.1, chloride 84, CO2 24, glucose 100, BUN 27, creatinine 1.29, AST 30, ALT 29, serum osmolality 252.  WBC 8.2, hemoglobin 11.8, platelets 245.  Influenza A PCR positive.  Influenza B PCR negative.  COVID-19 PCR negative.  Urinalysis unrevealing.  Chest x-ray with small right suprahilar opacity; no pleural  effusion or pneumothorax.  EDP consulted [facility] for further evaluation and management of generalized weakness in the setting of hyponatremia with poor oral intake, AKI, poorly controlled hypertension and new onset atrial fibrillation.  Hospital course:    Hypovolemic hyponatremia 2/2 hypovolemia versus medication side effect Patient presenting to ED with poor oral intake, recent diagnosis of influenza a outpatient with associated nausea and vomiting.  Sodium 118 on arrival.  Serum osmolality low, urine sodium 90, urine osmolality 430.  Suspect hypovolemic hyponatremia in the setting of poor oral intake and vomiting, also consideration of chlorthalidone use outpatient.  Patient was started on IV fluid hydration with improvement of sodium to 124 at time of discharge.  Home chlorthalidone discontinued.  Recommend repeat BMP 1 week to assess sodium level.  Atrial fibrillation, new onset Patient with previous history of SVT, follows with electrophysiology outpatient, Dr. Massie Maroon. Curt Bears. EKG now with atrial fibrillation, likely related to acute illness from positive influenza versus electrolyte derangement.  Seen by cardiology and recommends initiation of anticoagulation with Eliquis and continue rate control strategy with metoprolol; and plan outpatient follow-up for consideration of cardioversion in 3-4 weeks if remains in A. fib.  CHADS2 VASc Score = 4.  TTE with LVEF 60 to 65%, no LV regional wall motion normalities, mild concentric LVH, diastolic function indeterminant, LA moderately dilated, RA mildly dilated, moderate TR, mild MR, IVC normal in size.  TSH 1.471, within normal limits.  Continue metoprolol tartrate 75 mg p.o. twice daily, started on anticoagulation with Eliquis 5 mg p.o. BID.  Has outpatient follow-up scheduled with Roderic Palau, NP on 10/23/2021 repleted  during hospitalization.  And 11/14/2021.   Hypomagnesemia: resolved Magnesium 2.1 this am --Repeat electrolytes in the a.m.   Recent  influenza A viral infection Diagnosed by his PCP outpatient, was deemed out side the treatment window for Tamiflu.  Oxygenating well on room air.  Supportive care.   Essential hypertension Chlorthalidone was discontinued due to hyponatremia as above.  Continue metoprolol tartrate 75 mg p.o. twice daily, valsartan 160 mg p.o. twice daily.  Outpatient follow-up with PCP/cardiology.   Hx SVT Continue beta-blocker as above; outpatient follow-up with cardiology/electrophysiology.  CAD s/p PCI RCA Hyperlipidemia Continue aspirin 81 mg p.o. daily, atorvastatin 40 mg p.o. daily   CKD stage IIIb Baseline creatinine 1.4-1.7.  Currently at baseline.  Creatinine 1.26 at time of discharge.  Recommend BMP 1 week.   BPH: Tamsulosin 0.4 mg p.o. daily, Finasteride 5 mg p.o. daily   Weakness/debility/deconditioning: Seen by PT/OT with no follow-up recommended.  Discharge Diagnoses:  Principal Problem:   Hyponatremia Active Problems:   Coronary atherosclerosis of native coronary artery   Mixed hyperlipidemia   Essential hypertension, benign   Renal artery stenosis (HCC)   Subclavian artery stenosis [facility])    Discharge Instructions  Discharge Instructions     Call MD for:  difficulty breathing, headache or visual disturbances   Complete by: As directed    Call MD for:  extreme fatigue   Complete by: As directed    Call MD for:  persistant dizziness or light-headedness   Complete by: As directed    Call MD for:  persistant nausea and vomiting   Complete by: As directed    Call MD for:  severe uncontrolled pain   Complete by: As directed    Call MD for:  temperature >100.4   Complete by: As directed    Diet - low sodium heart healthy   Complete by: As directed    Increase activity slowly   Complete by: As directed       Allergies as of 10/13/2021       Reactions   Amlodipine Other (See Comments)   Tolerates 2.5 mg, higher doses cause LEE with blistering   Erythromycin     Penicillins    Prednisone         Medication List     STOP taking these medications    chlorthalidone 25 MG tablet Commonly known as: HYGROTON       TAKE these medications    apixaban 5 MG Tabs tablet Commonly known as: ELIQUIS Take 1 tablet (5 mg total) by mouth 2 (two) times daily.   aspirin EC 81 MG tablet Take 1 tablet (81 mg total) by mouth daily.   atorvastatin 40 MG tablet Commonly known as: LIPITOR Take 40 mg by mouth daily.   CALCIUM PLUS VITAMIN D PO Take 1 tablet by mouth daily.   cloNIDine 0.1 MG tablet Commonly known as: CATAPRES TAKE 2 TABLETS BY MOUTH EVERY DAY What changed:  how much to take how to take this when to take this additional instructions   Co Q-10 100 MG Caps Take 100 mg by mouth daily.   finasteride 5 MG tablet Commonly known as: PROSCAR Take 5 mg by mouth daily.   Fish Oil 1000 MG Caps Take 1,200 mg by mouth daily.   fluticasone 44 MCG/ACT inhaler Commonly known as: FLOVENT HFA Inhale 2 puffs into the lungs in the morning and at bedtime.   fluticasone 50 MCG/ACT nasal spray Commonly known as: FLONASE Place 2 sprays into both  nostrils daily as needed for allergies.   folic acid 638 MCG tablet Commonly known as: FOLVITE Take 400 mcg by mouth daily.   LORazepam 1 MG tablet Commonly known as: ATIVAN Take 1 mg by mouth in the morning and at bedtime.   Metoprolol Tartrate 75 MG Tabs Take 75 mg by mouth 2 (two) times daily. What changed:  medication strength how much to take   omeprazole 20 MG capsule Commonly known as: PRILOSEC Take 20 mg by mouth daily.   Polyethyl Glycol-Propyl Glycol 0.4-0.3 % Soln Apply 1 drop to eye at bedtime as needed (dryness).   PRESERVISION AREDS 2+MULTI VIT PO Take 1 tablet by mouth daily.   tamsulosin 0.4 MG Caps capsule Commonly known as: FLOMAX Take 0.4 mg by mouth daily.   triamcinolone cream 0.1 % Commonly known as: KENALOG Apply 1 application topically as needed  (irritation). Apply to both legs   valsartan 320 MG tablet Commonly known as: DIOVAN TAKE 1/2 TABLET BY MOUTH 2 TIMES DAILY. PLEASE SCHEDULE AN APPT FOR FUTURE REFILLS What changed: See the new instructions.        Allergies  Allergen Reactions   Amlodipine Other (See Comments)    Tolerates 2.5 mg, higher doses cause LEE with blistering   Erythromycin    Penicillins    Prednisone     Consultations: Cardiology   Procedures/Studies: DG Chest 2 View  Result Date: 10/09/2021 CLINICAL DATA:  Cough EXAM: CHEST - 2 VIEW COMPARISON:  2010 FINDINGS: Small right suprahilar opacity. No pleural effusion or pneumothorax. Normal heart size. No acute osseous abnormality. IMPRESSION: Small right suprahilar opacity. Follow-up recommended to ensure resolution. Electronically Signed   By: Macy Mis M.D.   On: 10/09/2021 13:24   DG CHEST PORT 1 VIEW  Result Date: 10/12/2021 CLINICAL DATA:  Short of breath. EXAM: PORTABLE CHEST 1 VIEW COMPARISON:  10/09/2021 FINDINGS: Right suprahilar nodular density is less apparent on today's study. Just above this are some calcified granulomata. Heart size and vascularity normal.  No infiltrate or effusion. IMPRESSION: No acute abnormality. Improvement in right upper lobe nodular density which may have been atelectasis. Electronically Signed   By: Franchot Gallo M.D.   On: 10/12/2021 10:34   ECHOCARDIOGRAM COMPLETE  Result Date: 10/10/2021    ECHOCARDIOGRAM REPORT   Patient Name:   DARAY POLGAR [facility] Date of Exam: 10/10/2021 Medical Rec #:  [id number]      Height:       70.0 in Accession #:    [id number]     Weight:       165.0 lb Date of Birth:  [birth year]     BSA:          1.923 m Patient Age:    87 years       BP:           157/88 mmHg Patient Gender: M              HR:           101 bpm. Exam Location:  Inpatient Procedure: 2D Echo, Color Doppler and Cardiac Doppler Indications:    Abnormal EKG  History:        Patient has prior history of Echocardiogram  examinations. Risk                 Factors:Hypertension.  Sonographer:    Jyl Heinz Referring Phys: [place]  1. Left ventricular ejection fraction, by estimation, is 60 to 65%. The left ventricle  has normal function. The left ventricle has no regional wall motion abnormalities. There is mild concentric left ventricular hypertrophy. Diastolic function is indeterminant due to atrial fibrillation.  2. Right ventricular systolic function is normal. The right ventricular size is normal. There is mildly elevated pulmonary artery systolic pressure. The estimated right ventricular systolic pressure is 08.6 mmHg.  3. Left atrial size was moderately dilated.  4. Right atrial size was mildly dilated.  5. The mitral valve is normal in structure. Mild mitral valve regurgitation.  6. Tricuspid valve regurgitation is moderate.  7. The aortic valve is tricuspid. There is mild calcification of the aortic valve. There is mild thickening of the aortic valve. Aortic valve regurgitation is trivial.  8. The inferior vena cava is normal in size with greater than 50% respiratory variability, suggesting right atrial pressure of 3 mmHg. Comparison(s): Compared to prior TTE in 01/2021, the patient is now in AFib. Otherwise, there is no significant change. FINDINGS  Left Ventricle: Left ventricular ejection fraction, by estimation, is 60 to 65%. The left ventricle has normal function. The left ventricle has no regional wall motion abnormalities. The left ventricular internal cavity size was normal in size. There is  mild concentric left ventricular hypertrophy. Diastolic function is indeterminant due to atrial fibrillation. Right Ventricle: The right ventricular size is normal. No increase in right ventricular wall thickness. Right ventricular systolic function is normal. There is mildly elevated pulmonary artery systolic pressure. The tricuspid regurgitant velocity is 3.24  m/s, and with an assumed right atrial  pressure of 3 mmHg, the estimated right ventricular systolic pressure is 57.8 mmHg. Left Atrium: Left atrial size was moderately dilated. Right Atrium: Right atrial size was mildly dilated. Pericardium: There is no evidence of pericardial effusion. Mitral Valve: The mitral valve is normal in structure. There is mild thickening of the mitral valve leaflet(s). There is mild calcification of the mitral valve leaflet(s). Mild mitral annular calcification. Mild mitral valve regurgitation. Tricuspid Valve: The tricuspid valve is normal in structure. Tricuspid valve regurgitation is moderate. Aortic Valve: The aortic valve is tricuspid. There is mild calcification of the aortic valve. There is mild thickening of the aortic valve. Aortic valve regurgitation is trivial. Aortic valve peak gradient measures 5.9 mmHg. Pulmonic Valve: The pulmonic valve was normal in structure. Pulmonic valve regurgitation is trivial. Aorta: The aortic root and ascending aorta are structurally normal, with no evidence of dilitation. Venous: The inferior vena cava is normal in size with greater than 50% respiratory variability, suggesting right atrial pressure of 3 mmHg. IAS/Shunts: No atrial level shunt detected by color flow Doppler.  LEFT VENTRICLE PLAX 2D LVIDd:         4.50 cm     Diastology LVIDs:         2.90 cm     LV e' medial:    6.31 cm/s LV PW:         1.20 cm     LV E/e' medial:  15.1 LV IVS:        1.40 cm     LV e' lateral:   8.27 cm/s LVOT diam:     2.00 cm     LV E/e' lateral: 11.5 LV SV:         39 LV SV Index:   20 LVOT Area:     3.14 cm  LV Volumes (MOD) LV vol d, MOD A2C: 56.2 ml LV vol d, MOD A4C: 62.3 ml LV vol s, MOD A2C: 25.9 ml LV vol  s, MOD A4C: 30.4 ml LV SV MOD A2C:     30.3 ml LV SV MOD A4C:     62.3 ml LV SV MOD BP:      30.9 ml RIGHT VENTRICLE            IVC RV Basal diam:  3.90 cm    IVC diam: 1.50 cm RV Mid diam:    2.70 cm RV S prime:     7.40 cm/s TAPSE (M-mode): 1.9 cm LEFT ATRIUM             Index         RIGHT ATRIUM           Index LA diam:        4.30 cm 2.24 cm/m   RA Area:     22.80 cm LA Vol (A2C):   79.8 ml 41.49 ml/m  RA Volume:   66.50 ml  34.57 ml/m LA Vol (A4C):   69.5 ml 36.13 ml/m LA Biplane Vol: 77.3 ml 40.19 ml/m  AORTIC VALVE AV Area (Vmax): 1.71 cm AV Vmax:        121.00 cm/s AV Peak Grad:   5.9 mmHg LVOT Vmax:      65.90 cm/s LVOT Vmean:     47.800 cm/s LVOT VTI:       0.124 m  AORTA Ao Root diam: 2.70 cm Ao Asc diam:  2.80 cm MITRAL VALVE               TRICUSPID VALVE MV Area (PHT): 5.06 cm    TR Peak grad:   42.0 mmHg MV Decel Time: 150 msec    TR Vmax:        324.00 cm/s MV E velocity: 95.50 cm/s MV A velocity: 33.00 cm/s  SHUNTS MV E/A ratio:  2.89        Systemic VTI:  0.12 m                            Systemic Diam: 2.00 cm Gwyndolyn Kaufman MD Electronically signed by Gwyndolyn Kaufman MD Signature Date/Time: 10/10/2021/1:09:35 PM    Final      Subjective: Patient seen examined at bedside, resting comfortably.  Sitting in bedside chair.  Just finished breakfast.  No complaints this morning and wishes to discharge home.  Sodium up to 124 with baseline 128-130.  Discussed need to discontinue chlorthalidone on discharge.  Patient has follow-up scheduled with cardiology soon.  No other specific complaints or concerns at this time.  Denies headache, no visual changes, no dizziness, no chest pain, no palpitations, no shortness of breath, no abdominal pain, no fever/chills/night sweats, no nausea/vomiting/diarrhea, no weakness, no fatigue, no paresthesias.  No acute events overnight per nursing staff.  Discharge Exam: Vitals:   10/13/21 0647 10/13/21 0939  BP: (!) 173/106 114/64  Pulse:  81  Resp:  18  Temp:  97.7 F (36.5 C)  SpO2:  97%   Vitals:   10/12/21 2128 10/13/21 0546 10/13/21 0647 10/13/21 0939  BP: (!) 175/92 (!) 181/99 (!) 173/106 114/64  Pulse: 83 85  81  Resp:    18  Temp:  98.3 F (36.8 C)  97.7 F (36.5 C)  TempSrc:  Tympanic  Oral  SpO2:  97%  97%   Weight:      Height:        General: Pt is alert, awake, not in acute distress Cardiovascular: Irregularly irregular rhythm, normal  rate, S1/S2 +, no rubs, no gallops Respiratory: CTA bilaterally, no wheezing, no rhonchi, on room air Abdominal: Soft, NT, ND, bowel sounds + Extremities: no edema, no cyanosis    The results of significant diagnostics from this hospitalization (including imaging, microbiology, ancillary and laboratory) are listed below for reference.     Microbiology: Recent Results (from the past 240 hour(s))  Resp Panel by RT-PCR (Flu A&B, Covid) Nasopharyngeal Swab     Status: Abnormal   Collection Time: 10/09/21 10:05 AM   Specimen: Nasopharyngeal Swab; Nasopharyngeal(NP) swabs in vial transport medium  Result Value Ref Range Status   SARS Coronavirus 2 by RT PCR NEGATIVE NEGATIVE Final    Comment: (NOTE) SARS-CoV-2 target nucleic acids are NOT DETECTED.  The SARS-CoV-2 RNA is generally detectable in upper respiratory specimens during the acute phase of infection. The lowest concentration of SARS-CoV-2 viral copies this assay can detect is 138 copies/mL. A negative result does not preclude SARS-Cov-2 infection and should not be used as the sole basis for treatment or other patient management decisions. A negative result may occur with  improper specimen collection/handling, submission of specimen other than nasopharyngeal swab, presence of viral mutation(s) within the areas targeted by this assay, and inadequate number of viral copies(<138 copies/mL). A negative result must be combined with clinical observations, patient history, and epidemiological information. The expected result is Negative.  Fact Sheet for Patients:  EntrepreneurPulse.com.au  Fact Sheet for Healthcare Providers:  IncredibleEmployment.be  This test is no t yet approved or cleared by the [place] FDA and  has been authorized for detection  and/or diagnosis of SARS-CoV-2 by FDA under an Emergency Use Authorization (EUA). This EUA will remain  in effect (meaning this test can be used) for the duration of the COVID-19 declaration under Section 564(b)(1) of the Act, 21 U.S.C.section 360bbb-3(b)(1), unless the authorization is terminated  or revoked sooner.       Influenza A by PCR POSITIVE (A) NEGATIVE Final   Influenza B by PCR NEGATIVE NEGATIVE Final    Comment: (NOTE) The Xpert Xpress SARS-CoV-2/FLU/RSV plus assay is intended as an aid in the diagnosis of influenza from Nasopharyngeal swab specimens and should not be used as a sole basis for treatment. Nasal washings and aspirates are unacceptable for Xpert Xpress SARS-CoV-2/FLU/RSV testing.  Fact Sheet for Patients: EntrepreneurPulse.com.au  Fact Sheet for Healthcare Providers: IncredibleEmployment.be  This test is not yet approved or cleared by the [place] FDA and has been authorized for detection and/or diagnosis of SARS-CoV-2 by FDA under an Emergency Use Authorization (EUA). This EUA will remain in effect (meaning this test can be used) for the duration of the COVID-19 declaration under Section 564(b)(1) of the Act, 21 U.S.C. section 360bbb-3(b)(1), unless the authorization is terminated or revoked.  Performed at [facility], [place] [address], [place], [place] [address]      Labs: BNP (last 3 results) No results for input(s): BNP in the last 8760 hours. Basic Metabolic Panel: Recent Labs  Lab 10/10/21 0315 10/10/21 0533 10/11/21 0725 10/12/21 0631 10/13/21 0612  NA 119* 118* 121* 123* 124*  K 4.1 4.2 4.2 4.8 4.3  CL 88* 87* 91* 93* 94*  CO2 23 24 23 24 24   GLUCOSE 92 95 86 88 82  BUN 27* 26* 26* 29* 26*  CREATININE 1.29* 1.24 1.30* 1.37* 1.26*  CALCIUM 7.6* 7.8* 7.7* 7.8* 8.0*  MG  --  1.3* 2.1  --   --   PHOS  --  2.9  --   --   --  Liver Function Tests: Recent Labs  Lab  10/09/21 1231 10/10/21 0533  AST 30 28  ALT 29 24  ALKPHOS 92 76  BILITOT 1.0 0.8  PROT 7.0 5.7*  ALBUMIN 3.2* 2.6*   No results for input(s): LIPASE, AMYLASE in the last 168 hours. No results for input(s): AMMONIA in the last 168 hours. CBC: Recent Labs  Lab 10/09/21 1231 10/09/21 1633 10/10/21 0533  WBC 8.2 7.2 5.9  NEUTROABS 6.1  --   --   HGB 11.8* 10.8* 10.1*  HCT 32.5* 29.3* 27.9*  MCV 85.5 86.2 88.3  PLT 245 244 240   Cardiac Enzymes: No results for input(s): CKTOTAL, CKMB, CKMBINDEX, TROPONINI in the last 168 hours. BNP: Invalid input(s): POCBNP CBG: Recent Labs  Lab 10/09/21 1231  GLUCAP 117*   D-Dimer No results for input(s): DDIMER in the last 72 hours. Hgb A1c No results for input(s): HGBA1C in the last 72 hours. Lipid Profile No results for input(s): CHOL, HDL, LDLCALC, TRIG, CHOLHDL, LDLDIRECT in the last 72 hours. Thyroid function studies Recent Labs    10/11/21 0725  TSH 1.471   Anemia work up No results for input(s): VITAMINB12, FOLATE, FERRITIN, TIBC, IRON, RETICCTPCT in the last 72 hours. Urinalysis    Component Value Date/Time   COLORURINE YELLOW 10/09/2021 1329   APPEARANCEUR CLEAR 10/09/2021 1329   LABSPEC 1.015 10/09/2021 1329   PHURINE 7.5 10/09/2021 1329   GLUCOSEU NEGATIVE 10/09/2021 1329   HGBUR NEGATIVE 10/09/2021 1329   BILIRUBINUR NEGATIVE 10/09/2021 1329   KETONESUR NEGATIVE 10/09/2021 1329   PROTEINUR 100 (A) 10/09/2021 1329   NITRITE NEGATIVE 10/09/2021 1329   LEUKOCYTESUR NEGATIVE 10/09/2021 1329   Sepsis Labs Invalid input(s): PROCALCITONIN,  WBC,  LACTICIDVEN Microbiology Recent Results (from the past 240 hour(s))  Resp Panel by RT-PCR (Flu A&B, Covid) Nasopharyngeal Swab     Status: Abnormal   Collection Time: 10/09/21 10:05 AM   Specimen: Nasopharyngeal Swab; Nasopharyngeal(NP) swabs in vial transport medium  Result Value Ref Range Status   SARS Coronavirus 2 by RT PCR NEGATIVE NEGATIVE Final    Comment:  (NOTE) SARS-CoV-2 target nucleic acids are NOT DETECTED.  The SARS-CoV-2 RNA is generally detectable in upper respiratory specimens during the acute phase of infection. The lowest concentration of SARS-CoV-2 viral copies this assay can detect is 138 copies/mL. A negative result does not preclude SARS-Cov-2 infection and should not be used as the sole basis for treatment or other patient management decisions. A negative result may occur with  improper specimen collection/handling, submission of specimen other than nasopharyngeal swab, presence of viral mutation(s) within the areas targeted by this assay, and inadequate number of viral copies(<138 copies/mL). A negative result must be combined with clinical observations, patient history, and epidemiological information. The expected result is Negative.  Fact Sheet for Patients:  EntrepreneurPulse.com.au  Fact Sheet for Healthcare Providers:  IncredibleEmployment.be  This test is no t yet approved or cleared by the [place] FDA and  has been authorized for detection and/or diagnosis of SARS-CoV-2 by FDA under an Emergency Use Authorization (EUA). This EUA will remain  in effect (meaning this test can be used) for the duration of the COVID-19 declaration under Section 564(b)(1) of the Act, 21 U.S.C.section 360bbb-3(b)(1), unless the authorization is terminated  or revoked sooner.       Influenza A by PCR POSITIVE (A) NEGATIVE Final   Influenza B by PCR NEGATIVE NEGATIVE Final    Comment: (NOTE) The Xpert Xpress SARS-CoV-2/FLU/RSV plus assay is intended as  an aid in the diagnosis of influenza from Nasopharyngeal swab specimens and should not be used as a sole basis for treatment. Nasal washings and aspirates are unacceptable for Xpert Xpress SARS-CoV-2/FLU/RSV testing.  Fact Sheet for Patients: EntrepreneurPulse.com.au  Fact Sheet for Healthcare  Providers: IncredibleEmployment.be  This test is not yet approved or cleared by the [place] FDA and has been authorized for detection and/or diagnosis of SARS-CoV-2 by FDA under an Emergency Use Authorization (EUA). This EUA will remain in effect (meaning this test can be used) for the duration of the COVID-19 declaration under Section 564(b)(1) of the Act, 21 U.S.C. section 360bbb-3(b)(1), unless the authorization is terminated or revoked.  Performed at [facility], [place] [address], [place], [place] [address]      Time coordinating discharge: Over 30 minutes  SIGNED:   Tanairi Cypert J [place] DO  [facility] 10/13/2021, 10:56 AM

## 2021-10-16 DIAGNOSIS — I4891 Unspecified atrial fibrillation: Secondary | ICD-10-CM | POA: Diagnosis not present

## 2021-10-16 DIAGNOSIS — D6869 Other thrombophilia: Secondary | ICD-10-CM | POA: Diagnosis not present

## 2021-10-16 DIAGNOSIS — N1832 Chronic kidney disease, stage 3b: Secondary | ICD-10-CM | POA: Diagnosis not present

## 2021-10-16 DIAGNOSIS — D649 Anemia, unspecified: Secondary | ICD-10-CM | POA: Diagnosis not present

## 2021-10-16 DIAGNOSIS — E871 Hypo-osmolality and hyponatremia: Secondary | ICD-10-CM | POA: Diagnosis not present

## 2021-10-17 NOTE — Progress Notes (Signed)
Cardiology Office Note Date:  10/17/2021  Patient ID:  Rumaldo, Difatta 1930-05-12, MRN [id number] PCP:  Mayra Neer, MD  Cardiologist:  Dr. Gwenlyn Found Electrophysiologist: Curt Bears    Chief Complaint: post hospital visit  History of Present Illness: MATTHEO SWINDLE is a 87 y.o. male with history of CAD (BMS to RCA 2001, cutting balloon RCA 2002), R>L subclavian stenosis, HTN, HLD, RAS (prior renal stent placement), SVT  Single syncopal event of unclear etiology > w/u unrevealing  He comes in today to be seen for Dr. Curt Bears, last seen byb him 04/15/21, he was doing well without symptoms. Was unaware of the short bursts of SVT on the monitor with plans to monitor for development of symptoms/awareness.  With plans to see EP PRN  He was [facility] to [facility] 10/09/21 with N/V, fever, + flu the week prior, found markedly hyponatremic and in AFib, started on Eliquis and rate controlled with lopressor, planned for out patient follow up for DCCV. Suspect triggered by acute illness.   Take BP on left, patient is aware    TODAY He comes accompanied by his wife and daughter He states that today he feels so much better then he has in the last week or 2, ate a fantastic dinner last night with good appetite. He denies any CP, palpitations or cardiac awareness No near syncope or syncope. He does feel like he gets a little SOB sometimes, no rest SOB When seated for prolonged periods his ankles will swell, though resolves with elevation or activity  No bleeding or signs of bleeding  He saw Dr. Brigitte Pulse on the 14th had follow labs done, pending report of results    Arrhythmia Hx Svt noted incidentally on a monitor march 2022 worn for syncope  AFib Dec 2022 associated with acute flu illness  AAD None to date   Past Medical History:  Diagnosis Date   Atherosclerosis of abdominal aorta ([place])    CT/abd and pelvis   Carotid artery occlusion    bilateral carotid bruit, right greater then left  -bilateral 40-50% stenosis, 12/26/09- no change   Chronic anxiety    Coronary artery disease    s/p BM stent, prox and mid RCA, 2001, cutting ballon RCA stenosis, 2002   Dysphagia    from esophageal dysmotility-tx with careful eating.    History of echocardiogram    2/11 echo EF 70%, mild MR, mildly elevated pulmonary pressures 42 mmHg   History of renal angiogram    9/10, showed patent renal stents, 30-40% instent restenosis ws the most severe lesion   Hyperlipidemia    Left kidney mass    lower pole, observing by urology- Dr. Reece Agar   Peripheral neuropathy    in both feet from nerve compression    Renovascular hypertension    s./p. bilateral RA stent implant   Subclavian artery stenosis [facility])     Past Surgical History:  Procedure Laterality Date   CARDIAC CATHETERIZATION  2001   BM stent, prox and mid RCA   cataract surgery Bilateral 04/14   COLONOSCOPY     every 10 years   left foot surgery     RENAL ARTERY STENT  08/07    Current Outpatient Medications  Medication Sig Dispense Refill   apixaban (ELIQUIS) 5 MG TABS tablet Take 1 tablet (5 mg total) by mouth 2 (two) times daily. 60 tablet 2   aspirin EC 81 MG tablet Take 1 tablet (81 mg total) by mouth daily. 90 tablet 3  atorvastatin (LIPITOR) 40 MG tablet Take 40 mg by mouth daily.     Calcium Carbonate-Vitamin D (CALCIUM PLUS VITAMIN D PO) Take 1 tablet by mouth daily.      cloNIDine (CATAPRES) 0.1 MG tablet TAKE 2 TABLETS BY MOUTH EVERY DAY (Patient taking differently: Take 0.2 mg by mouth daily.) 180 tablet 3   Coenzyme Q10 (CO Q-10) 100 MG CAPS Take 100 mg by mouth daily.     finasteride (PROSCAR) 5 MG tablet Take 5 mg by mouth daily.     fluticasone (FLONASE) 50 MCG/ACT nasal spray Place 2 sprays into both nostrils daily as needed for allergies.     fluticasone (FLOVENT HFA) 44 MCG/ACT inhaler Inhale 2 puffs into the lungs in the morning and at bedtime.     folic acid (FOLVITE) 287 MCG tablet Take 400 mcg by  mouth daily.     LORazepam (ATIVAN) 1 MG tablet Take 1 mg by mouth in the morning and at bedtime.     metoprolol tartrate 75 MG TABS Take 75 mg by mouth 2 (two) times daily. 60 tablet 2   Multiple Vitamins-Minerals (PRESERVISION AREDS 2+MULTI VIT PO) Take 1 tablet by mouth daily.     Omega-3 Fatty Acids (FISH OIL) 1000 MG CAPS Take 1,200 mg by mouth daily.     omeprazole (PRILOSEC) 20 MG capsule Take 20 mg by mouth daily.     Polyethyl Glycol-Propyl Glycol 0.4-0.3 % SOLN Apply 1 drop to eye at bedtime as needed (dryness).     tamsulosin (FLOMAX) 0.4 MG CAPS capsule Take 0.4 mg by mouth daily.     triamcinolone cream (KENALOG) 0.1 % Apply 1 application topically as needed (irritation). Apply to both legs     valsartan (DIOVAN) 320 MG tablet TAKE 1/2 TABLET BY MOUTH 2 TIMES DAILY. PLEASE SCHEDULE AN APPT FOR FUTURE REFILLS (Patient taking differently: Take 160 mg by mouth in the morning and at bedtime. Schedule an appointment for future refills) 90 tablet 2   No current facility-administered medications for this visit.    Allergies:   Amlodipine, Erythromycin, Penicillins, and Prednisone   Social History:  The patient  reports that he has never smoked. He has never used smokeless tobacco. He reports that he does not drink alcohol and does not use drugs.   Family History:  The patient's family history includes Hypertension in his father and mother.  ROS:  Please see the history of present illness.    All other systems are reviewed and otherwise negative.   PHYSICAL EXAM:  VS:  There were no vitals taken for this visit. BMI: There is no height or weight on file to calculate BMI. Well nourished, well developed, in no acute distress HEENT: normocephalic, atraumatic Neck: no JVD, carotid bruits or masses Cardiac:  RRR; no significant murmurs, no rubs, or gallops Lungs:  CTA b/l, no wheezing, rhonchi or rales Abd: soft, nontender MS: no deformity age appropriate atrophy Ext:  no edema Skin:  warm and dry, no rash Neuro:  No gross deficits appreciated Psych: euthymic mood, full affect    EKG:  Done today and reviewed by myself shows  AFib 87bpm, RBBB    TTE 01/10/21  Review of the above records today demonstrates:   1. Very frequent PACs during acquisition.   2. Left ventricular ejection fraction, by estimation, is 65 to 70%. The  left ventricle has normal function. The left ventricle has no regional  wall motion abnormalities. Left ventricular diastolic parameters are  consistent with  Grade II diastolic  dysfunction (pseudonormalization). Elevated left ventricular end-diastolic  pressure.   3. Right ventricular systolic function is normal. The right ventricular  size is normal. There is moderately elevated pulmonary artery systolic  pressure. The estimated right ventricular systolic pressure is 99.3 mmHg.   4. Left atrial size was moderately dilated.   5. Right atrial size was moderately dilated.   6. The mitral valve is normal in structure. Moderate mitral valve  regurgitation. No evidence of mitral stenosis.   7. Tricuspid valve regurgitation is severe.   8. The aortic valve is normal in structure. Aortic valve regurgitation is  not visualized. No aortic stenosis is present.   9. The inferior vena cava is normal in size with greater than 50%  respiratory variability, suggesting right atrial pressure of 3 mmHg.    Cardiac monitor 03/28/2021 personally reviewed Patient had a min HR of 40 bpm, max HR of 190 bpm, and avg HR of 51 bpm. Predominant underlying rhythm was Sinus Rhythm. 1 run of Ventricular Tachycardia occurred lasting 4 beats with a max rate of 190 bpm (avg 138 bpm). 380 Supraventricular Tachycardia runs occurred, the run with the fastest interval lasting 5 beats with a max rate of 190 bpm, the longest lasting 12 mins 13 secs with an avg rate of 146 bpm. Isolated SVEs were frequent (7.8%, 71696). Isolated VEs were rare (<1.0%). There were no patient triggered  events.   Recent Labs: 10/10/2021: ALT 24; Hemoglobin 10.1; Platelets 240 10/11/2021: Magnesium 2.1; TSH 1.471 10/13/2021: BUN 26; Creatinine, Ser 1.26; Potassium 4.3; Sodium 124  No results found for requested labs within last 8760 hours.   Estimated Creatinine Clearance: 41 mL/min (A) (by C-G formula based on SCr of 1.26 mg/dL (H)).   Wt Readings from Last 3 Encounters:  10/09/21 165 lb (74.8 kg)  08/02/21 168 lb 3.2 oz (76.3 kg)  06/18/21 166 lb 9.6 oz (75.6 kg)     Other studies reviewed: Additional studies/records reviewed today include: summarized above  ASSESSMENT AND PLAN:  Atrial fibrillation CHA2DS2Vasc is 4, on Eliquis, appropriately dosed Rate controlled Unawre of rhythm  We discussed at length today diagnosis of Afib, role of anticoagulation and improtance of it Discussed management strategies  Recommend that we pursue DCCV after 3 weeks of uninterrupted Afib Discussed that procedure as well. They would like to check back in in a couple weeks revisit his rhythm and plan for DCCV perhaps at that time  Discussed risks/benefit of [place]   Vasculopath CAD w/prior stents Subclavian stenosis with 50-44mmHg difference in arm pressures R<L RAS with prior stents Non-obstructive carotid disease Follows with Dr. Gwenlyn Found  Recommend continue ASA   HTN Looks good Pt is aware to only have BPs done left arm    Disposition: F/u with [place] in 2-3 weeks, sooner if needed  Current medicines are reviewed at length with the patient today.  The patient did not have any concerns regarding medicines.  Venetia Night, PA-C 10/17/2021 7:55 AM     [facility] [address] [place] [address] [phone] (office)  [phone] (fax)

## 2021-10-18 ENCOUNTER — Encounter: Payer: Self-pay | Admitting: Physician Assistant

## 2021-10-18 ENCOUNTER — Ambulatory Visit: Payer: Medicare Other | Admitting: Physician Assistant

## 2021-10-18 ENCOUNTER — Other Ambulatory Visit: Payer: Self-pay

## 2021-10-18 VITALS — BP 130/70 | HR 87 | Ht 69.0 in | Wt 166.2 lb

## 2021-10-18 DIAGNOSIS — Z79899 Other long term (current) drug therapy: Secondary | ICD-10-CM

## 2021-10-18 DIAGNOSIS — I10 Essential (primary) hypertension: Secondary | ICD-10-CM | POA: Diagnosis not present

## 2021-10-18 DIAGNOSIS — I2510 Atherosclerotic heart disease of native coronary artery without angina pectoris: Secondary | ICD-10-CM

## 2021-10-18 DIAGNOSIS — I480 Paroxysmal atrial fibrillation: Secondary | ICD-10-CM | POA: Diagnosis not present

## 2021-10-18 DIAGNOSIS — I739 Peripheral vascular disease, unspecified: Secondary | ICD-10-CM

## 2021-10-18 NOTE — Patient Instructions (Addendum)
Medication Instructions:    Your physician recommends that you continue on your current medications as directed. Please refer to the Current Medication list given to you today.   *If you need a refill on your cardiac medications before your next appointment, please call your pharmacy*    Lab Work: [place]    If you have labs (blood work) drawn today and your tests are completely normal, you will receive your results only by: [place] (if you have MyChart) OR A paper copy in the mail If you have any lab test that is abnormal or we need to change your treatment, we will call you to review the results.    Testing/Procedures: NONE ORDERED  TODAY      Follow-Up: At [facility], you and your health needs are our priority.  As part of our continuing mission to provide you with exceptional heart care, we have created designated Provider Care Teams.  These Care Teams include your primary Cardiologist (physician) and Advanced Practice Providers (APPs -  Physician Assistants and Nurse Practitioners) who all work together to provide you with the care you need, when you need it.  We recommend signing up for the patient portal called "MyChart".  Sign up information is provided on this After Visit Summary.  MyChart is used to connect with patients for Virtual Visits (Telemedicine).  Patients are able to view lab/test results, encounter notes, upcoming appointments, etc.  Non-urgent messages can be sent to your provider as well.   To learn more about what you can do with MyChart, go to [email or web address].    Your next appointment:    3-4  week(s)  ( CONTACT [facility] FOR EP SCHEDULING ISSUES )   The format for your next appointment:   In Person  Provider:     [place] or Charlcie Cradle   Other Instructions

## 2021-10-22 ENCOUNTER — Other Ambulatory Visit: Payer: Self-pay | Admitting: Cardiovascular Disease

## 2021-10-23 ENCOUNTER — Other Ambulatory Visit: Payer: Self-pay

## 2021-10-23 ENCOUNTER — Ambulatory Visit (HOSPITAL_COMMUNITY)
Admit: 2021-10-23 | Discharge: 2021-10-23 | Disposition: A | Discharge status: Home / Self Care | Payer: Medicare Other | Attending: Nurse Practitioner | Admitting: Nurse Practitioner

## 2021-10-23 ENCOUNTER — Encounter (HOSPITAL_COMMUNITY): Payer: Self-pay

## 2021-10-23 NOTE — Progress Notes (Incomplete)
Cardiology Office Note Date:  10/23/2021  Patient ID:  Edward [name], Edward [name] 07/18/1930, MRN [id number] PCP:  Mayra Neer, MD  Cardiologist:  Dr. Gwenlyn Found Electrophysiologist: Curt Bears    Chief Complaint: post hospital visit  History of Present Illness: Edward [name] is a 87 y.o. male with history of CAD (BMS to RCA 2001, cutting balloon RCA 2002), R>L subclavian stenosis, HTN, HLD, RAS (prior renal stent placement), SVT  Single syncopal event of unclear etiology > w/u unrevealing  He comes in today to be seen for Dr. Curt Bears, last seen byb him 04/15/21, he was doing well without symptoms. Was unaware of the short bursts of SVT on the monitor with plans to monitor for development of symptoms/awareness.  With plans to see EP PRN  He was admitted to [facility] 10/09/21 with N/V, fever, + flu the week prior, found markedly hyponatremic and in AFib, started on Eliquis and rate controlled with lopressor, planned for out patient follow up for DCCV. Suspect triggered by acute illness.   Take BP on left, patient is aware    TODAY He comes accompanied by his wife and daughter He states that today he feels so much better then he has in the last week or 2, ate a fantastic dinner last night with good appetite. He denies any CP, palpitations or cardiac awareness No near syncope or syncope. He does feel like he gets a little SOB sometimes, no rest SOB When seated for prolonged periods his ankles will swell, though resolves with elevation or activity  No bleeding or signs of bleeding  He saw Dr. Brigitte Pulse on the 14th had follow labs done, pending report of results    Arrhythmia Hx Svt noted incidentally on a monitor march 2022 worn for syncope  AFib Dec 2022 associated with acute flu illness  AAD None to date   Past Medical History:  Diagnosis Date   Atherosclerosis of abdominal aorta ([place])    CT/abd and pelvis   Carotid artery occlusion    bilateral carotid bruit, right greater then left  -bilateral 40-50% stenosis, 12/26/09- no change   Chronic anxiety    Coronary artery disease    s/p BM stent, prox and mid RCA, 2001, cutting ballon RCA stenosis, 2002   Dysphagia    from esophageal dysmotility-tx with careful eating.    History of echocardiogram    2/11 echo EF 70%, mild MR, mildly elevated pulmonary pressures 42 mmHg   History of renal angiogram    9/10, showed patent renal stents, 30-40% instent restenosis ws the most severe lesion   Hyperlipidemia    Left kidney mass    lower pole, observing by urology- Dr. Reece Agar   Peripheral neuropathy    in both feet from nerve compression    Renovascular hypertension    s./p. bilateral RA stent implant   Subclavian artery stenosis [facility])     Past Surgical History:  Procedure Laterality Date   CARDIAC CATHETERIZATION  2001   BM stent, prox and mid RCA   cataract surgery Bilateral 04/14   COLONOSCOPY     every 10 years   left foot surgery     RENAL ARTERY STENT  08/07    Current Outpatient Medications  Medication Sig Dispense Refill   apixaban (ELIQUIS) 5 MG TABS tablet Take 1 tablet (5 mg total) by mouth 2 (two) times daily. 60 tablet 2   aspirin EC 81 MG tablet Take 1 tablet (81 mg total) by mouth daily. 90 tablet 3  atorvastatin (LIPITOR) 40 MG tablet Take 40 mg by mouth daily.     Calcium Carbonate-Vitamin D (CALCIUM PLUS VITAMIN D PO) Take 1 tablet by mouth daily.      cloNIDine (CATAPRES) 0.1 MG tablet TAKE 2 TABLETS BY MOUTH EVERY DAY (Patient taking differently: Take 0.2 mg by mouth daily.) 180 tablet 3   Coenzyme Q10 (CO Q-10) 100 MG CAPS Take 100 mg by mouth daily.     finasteride (PROSCAR) 5 MG tablet Take 5 mg by mouth daily.     fluticasone (FLONASE) 50 MCG/ACT nasal spray Place 2 sprays into both nostrils daily as needed for allergies.     fluticasone (FLOVENT HFA) 44 MCG/ACT inhaler Inhale 2 puffs into the lungs in the morning and at bedtime.     folic acid (FOLVITE) 637 MCG tablet Take 400 mcg by  mouth daily.     LORazepam (ATIVAN) 1 MG tablet Take 1 mg by mouth in the morning and at bedtime.     metoprolol tartrate 75 MG TABS Take 75 mg by mouth 2 (two) times daily. 60 tablet 2   Multiple Vitamins-Minerals (PRESERVISION AREDS 2) CAPS Take by mouth.     Multiple Vitamins-Minerals (PRESERVISION AREDS 2+MULTI VIT PO) Take 1 tablet by mouth daily.     Omega-3 Fatty Acids (FISH OIL) 1000 MG CAPS Take 1,200 mg by mouth daily.     omeprazole (PRILOSEC) 20 MG capsule Take 20 mg by mouth daily.     Polyethyl Glycol-Propyl Glycol 0.4-0.3 % SOLN Apply 1 drop to eye at bedtime as needed (dryness).     tamsulosin (FLOMAX) 0.4 MG CAPS capsule Take 0.4 mg by mouth daily.     triamcinolone cream (KENALOG) 0.1 % Apply 1 application topically as needed (irritation). Apply to both legs     valsartan (DIOVAN) 320 MG tablet TAKE 1/2 TABLET BY MOUTH 2 TIMES DAILY. PLEASE SCHEDULE AN APPT FOR FUTURE REFILLS 90 tablet 2   No current facility-administered medications for this encounter.    Allergies:   Amlodipine, Erythromycin, Penicillins, and Prednisone   Social History:  The patient  reports that he has never smoked. He has never used smokeless tobacco. He reports that he does not drink alcohol and does not use drugs.   Family History:  The patient's family history includes Hypertension in his father and mother.  ROS:  Please see the history of present illness.    All other systems are reviewed and otherwise negative.   PHYSICAL EXAM:  VS:  There were no vitals taken for this visit. BMI: There is no height or weight on file to calculate BMI. Well nourished, well developed, in no acute distress HEENT: normocephalic, atraumatic Neck: no JVD, carotid bruits or masses Cardiac:  RRR; no significant murmurs, no rubs, or gallops Lungs:  CTA b/l, no wheezing, rhonchi or rales Abd: soft, nontender MS: no deformity age appropriate atrophy Ext:  no edema Skin: warm and dry, no rash Neuro:  No gross  deficits appreciated Psych: euthymic mood, full affect    EKG:  Done today and reviewed by myself shows  AFib 87bpm, RBBB    TTE 01/10/21  Review of the above records today demonstrates:   1. Very frequent PACs during acquisition.   2. Left ventricular ejection fraction, by estimation, is 65 to 70%. The  left ventricle has normal function. The left ventricle has no regional  wall motion abnormalities. Left ventricular diastolic parameters are  consistent with Grade II diastolic  dysfunction (pseudonormalization). Elevated  left ventricular end-diastolic  pressure.   3. Right ventricular systolic function is normal. The right ventricular  size is normal. There is moderately elevated pulmonary artery systolic  pressure. The estimated right ventricular systolic pressure is 32.0 mmHg.   4. Left atrial size was moderately dilated.   5. Right atrial size was moderately dilated.   6. The mitral valve is normal in structure. Moderate mitral valve  regurgitation. No evidence of mitral stenosis.   7. Tricuspid valve regurgitation is severe.   8. The aortic valve is normal in structure. Aortic valve regurgitation is  not visualized. No aortic stenosis is present.   9. The inferior vena cava is normal in size with greater than 50%  respiratory variability, suggesting right atrial pressure of 3 mmHg.    Cardiac monitor 03/28/2021 personally reviewed Patient had a min HR of 40 bpm, max HR of 190 bpm, and avg HR of 51 bpm. Predominant underlying rhythm was Sinus Rhythm. 1 run of Ventricular Tachycardia occurred lasting 4 beats with a max rate of 190 bpm (avg 138 bpm). 380 Supraventricular Tachycardia runs occurred, the run with the fastest interval lasting 5 beats with a max rate of 190 bpm, the longest lasting 12 mins 13 secs with an avg rate of 146 bpm. Isolated SVEs were frequent (7.8%, 23343). Isolated VEs were rare (<1.0%). There were no patient triggered events.   Recent Labs: 10/10/2021:  ALT 24; Hemoglobin 10.1; Platelets 240 10/11/2021: Magnesium 2.1; TSH 1.471 10/13/2021: BUN 26; Creatinine, Ser 1.26; Potassium 4.3; Sodium 124  No results found for requested labs within last 8760 hours.   Estimated Creatinine Clearance: 39.7 mL/min (A) (by C-G formula based on SCr of 1.26 mg/dL (H)).   Wt Readings from Last 3 Encounters:  10/18/21 75.4 kg  10/09/21 74.8 kg  08/02/21 76.3 kg     Other studies reviewed: Additional studies/records reviewed today include: summarized above  ASSESSMENT AND PLAN:  Atrial fibrillation CHA2DS2Vasc is 4, on Eliquis, appropriately dosed Rate controlled Unawre of rhythm  We discussed at length today diagnosis of Afib, role of anticoagulation and improtance of it Discussed management strategies  Recommend that we pursue DCCV after 3 weeks of uninterrupted Afib Discussed that procedure as well. They would like to check back in in a couple weeks revisit his rhythm and plan for DCCV perhaps at that time  Discussed risks/benefit of [place]   Vasculopath CAD w/prior stents Subclavian stenosis with 50-72mmHg difference in arm pressures R<L RAS with prior stents Non-obstructive carotid disease Follows with Dr. Gwenlyn Found  Recommend continue ASA   HTN Looks good Pt is aware to only have BPs done left arm    Disposition: F/u with [place] in 2-3 weeks, sooner if needed  Current medicines are reviewed at length with the patient today.  The patient did not have any concerns regarding medicines.  Venetia Night, PA-C 10/23/2021 1:01 PM     [place] [address] [phone] (office)  [phone] (fax)

## 2021-11-05 DIAGNOSIS — E871 Hypo-osmolality and hyponatremia: Secondary | ICD-10-CM | POA: Diagnosis not present

## 2021-11-11 DIAGNOSIS — Z961 Presence of intraocular lens: Secondary | ICD-10-CM | POA: Diagnosis not present

## 2021-11-11 DIAGNOSIS — H26491 Other secondary cataract, right eye: Secondary | ICD-10-CM | POA: Diagnosis not present

## 2021-11-11 DIAGNOSIS — H353131 Nonexudative age-related macular degeneration, bilateral, early dry stage: Secondary | ICD-10-CM | POA: Diagnosis not present

## 2021-11-14 ENCOUNTER — Other Ambulatory Visit (HOSPITAL_COMMUNITY): Payer: Self-pay | Admitting: Cardiovascular Disease

## 2021-11-14 ENCOUNTER — Ambulatory Visit (HOSPITAL_COMMUNITY)
Admission: RE | Admit: 2021-11-14 | Discharge: 2021-11-14 | Disposition: A | Discharge status: Home / Self Care | Payer: Medicare Other | Source: Ambulatory Visit | Attending: Cardiology | Admitting: Cardiology

## 2021-11-14 ENCOUNTER — Other Ambulatory Visit: Payer: Self-pay

## 2021-11-14 DIAGNOSIS — G458 Other transient cerebral ischemic attacks and related syndromes: Secondary | ICD-10-CM | POA: Insufficient documentation

## 2021-11-14 DIAGNOSIS — I6523 Occlusion and stenosis of bilateral carotid arteries: Secondary | ICD-10-CM | POA: Insufficient documentation

## 2021-11-19 DIAGNOSIS — E871 Hypo-osmolality and hyponatremia: Secondary | ICD-10-CM | POA: Diagnosis not present

## 2021-11-24 NOTE — Progress Notes (Signed)
Cardiology Office Note Date:  11/24/2021  Patient ID:  Edward [name], Edward [name] Sep 30, 1930, MRN [id number] PCP:  Mayra Neer, MD  Cardiologist:  Dr. Gwenlyn Found Electrophysiologist: Dr. Curt Bears    Chief Complaint:  planned f/u  History of Present Illness: Edward [name] is a 88 y.o. male with history of CAD (BMS to RCA 2001, cutting balloon RCA 2002), R>L subclavian stenosis, HTN, HLD, RAS (prior renal stent placement), SVT, RBBB  Single syncopal event of unclear etiology > w/u unrevealing  He comes in today to be seen for Dr. Curt Bears, last seen byb him 04/15/21, he was doing well without symptoms. Was unaware of the short bursts of SVT on the monitor with plans to monitor for development of symptoms/awareness.  With plans to see EP PRN  He was admitted to [facility] 10/09/21 with N/V, fever, + flu the week prior, found markedly hyponatremic and in AFib, started on Eliquis and rate controlled with lopressor, planned for out patient follow up for DCCV. Suspect triggered by acute illness.   I saw him 10/18/21 He comes accompanied by his wife and daughter He states that today he feels so much better then he has in the last week or 2, ate a fantastic dinner last night with good appetite. He denies any CP, palpitations or cardiac awareness No near syncope or syncope. He does feel like he gets a little SOB sometimes, no rest SOB When seated for prolonged periods his ankles will swell, though resolves with elevation or activity No bleeding or signs of bleeding He saw Dr. Brigitte Pulse on the 14th had follow labs done, pending report of results He was unaware of the AF, rate controlled Discussed management strategies for AF, recommended DCCV after uninterrupted a/c, they wanted to check back in prior to proceeding with DCCV and planned to see in a few weeks.  TODAY He is again accompanied by his wife and daughter He remains un aware of his Afib, no CP, no palpitations or cardiac awareness He has DOE though  this is his baseline for years.  His daughter reports that he has 2 work sheds on the property, walking back from them to the house is up hill and then a flight of stairs, this he gets winded with and is what he is referring to. He denies any dizzy spells, near syncope or syncope.  He is worried 3 things  BP and HR unusually high for him, 140-160/90's and HR 80's - low 100s Edema off his chlorthalidone, they thought was stopped because it interfered with the Eliquis.  He has not been as active feeling like he needs to stay off his feet and keep elevated.  In review of his record, he was markedly hyponatremic at his hospitalization ad his diuretic stopped because of it  He is very compliant with his medicines, has not missed a dose of his Eliquis since starting it, takes his medicines "like clockwork"   Arrhythmia Hx Svt noted incidentally on a monitor march 2022 worn for syncope  AFib Dec 2022 associated with acute flu illness  AAD None to date   Past Medical History:  Diagnosis Date   Atherosclerosis of abdominal aorta ([place])    CT/abd and pelvis   Carotid artery occlusion    bilateral carotid bruit, right greater then left -bilateral 40-50% stenosis, 12/26/09- no change   Chronic anxiety    Coronary artery disease    s/p BM stent, prox and mid RCA, 2001, cutting ballon RCA stenosis, 2002   Dysphagia  from esophageal dysmotility-tx with careful eating.    History of echocardiogram    2/11 echo EF 70%, mild MR, mildly elevated pulmonary pressures 42 mmHg   History of renal angiogram    9/10, showed patent renal stents, 30-40% instent restenosis ws the most severe lesion   Hyperlipidemia    Left kidney mass    lower pole, observing by urology- Dr. Reece Agar   Peripheral neuropathy    in both feet from nerve compression    Renovascular hypertension    s./p. bilateral RA stent implant   Subclavian artery stenosis [facility])     Past Surgical History:  Procedure Laterality Date    CARDIAC CATHETERIZATION  2001   BM stent, prox and mid RCA   cataract surgery Bilateral 04/14   COLONOSCOPY     every 10 years   left foot surgery     RENAL ARTERY STENT  08/07    Current Outpatient Medications  Medication Sig Dispense Refill   apixaban (ELIQUIS) 5 MG TABS tablet Take 1 tablet (5 mg total) by mouth 2 (two) times daily. 60 tablet 2   aspirin EC 81 MG tablet Take 1 tablet (81 mg total) by mouth daily. 90 tablet 3   atorvastatin (LIPITOR) 40 MG tablet Take 40 mg by mouth daily.     Calcium Carbonate-Vitamin D (CALCIUM PLUS VITAMIN D PO) Take 1 tablet by mouth daily.      cloNIDine (CATAPRES) 0.1 MG tablet TAKE 2 TABLETS BY MOUTH EVERY DAY (Patient taking differently: Take 0.2 mg by mouth daily.) 180 tablet 3   Coenzyme Q10 (CO Q-10) 100 MG CAPS Take 100 mg by mouth daily.     finasteride (PROSCAR) 5 MG tablet Take 5 mg by mouth daily.     fluticasone (FLONASE) 50 MCG/ACT nasal spray Place 2 sprays into both nostrils daily as needed for allergies.     fluticasone (FLOVENT HFA) 44 MCG/ACT inhaler Inhale 2 puffs into the lungs in the morning and at bedtime.     folic acid (FOLVITE) 433 MCG tablet Take 400 mcg by mouth daily.     LORazepam (ATIVAN) 1 MG tablet Take 1 mg by mouth in the morning and at bedtime.     metoprolol tartrate 75 MG TABS Take 75 mg by mouth 2 (two) times daily. 60 tablet 2   Multiple Vitamins-Minerals (PRESERVISION AREDS 2) CAPS Take by mouth.     Multiple Vitamins-Minerals (PRESERVISION AREDS 2+MULTI VIT PO) Take 1 tablet by mouth daily.     Omega-3 Fatty Acids (FISH OIL) 1000 MG CAPS Take 1,200 mg by mouth daily.     omeprazole (PRILOSEC) 20 MG capsule Take 20 mg by mouth daily.     Polyethyl Glycol-Propyl Glycol 0.4-0.3 % SOLN Apply 1 drop to eye at bedtime as needed (dryness).     tamsulosin (FLOMAX) 0.4 MG CAPS capsule Take 0.4 mg by mouth daily.     triamcinolone cream (KENALOG) 0.1 % Apply 1 application topically as needed (irritation). Apply to  both legs     valsartan (DIOVAN) 320 MG tablet TAKE 1/2 TABLET BY MOUTH 2 TIMES DAILY. PLEASE SCHEDULE AN APPT FOR FUTURE REFILLS 90 tablet 2   No current facility-administered medications for this visit.    Allergies:   Amlodipine, Erythromycin, Penicillins, and Prednisone   Social History:  The patient  reports that he has never smoked. He has never used smokeless tobacco. He reports that he does not drink alcohol and does not use drugs.   Family  History:  The patient's family history includes Hypertension in his father and mother.  ROS:  Please see the history of present illness.    All other systems are reviewed and otherwise negative.   PHYSICAL EXAM:  VS:  There were no vitals taken for this visit. BMI: There is no height or weight on file to calculate BMI. Well nourished, well developed, in no acute distress HEENT: normocephalic, atraumatic Neck: no JVD, carotid bruits or masses Cardiac:  irreg-irreg; no significant murmurs, no rubs, or gallops Lungs:  CTA b/l, no wheezing, rhonchi or rales Abd: soft, nontender MS: no deformity age appropriate atrophy Ext:  1++ posterior ankle edema b/l Skin: warm and dry, no rash Neuro:  No gross deficits appreciated Psych: euthymic mood, full affect    EKG:  Done today and reviewed by myself shows  AFib 79bpm, RBBB  10/10/21: TTE IMPRESSIONS   1. Left ventricular ejection fraction, by estimation, is 60 to 65%. The  left ventricle has normal function. The left ventricle has no regional  wall motion abnormalities. There is mild concentric left ventricular  hypertrophy. Diastolic function is  indeterminant due to atrial fibrillation.   2. Right ventricular systolic function is normal. The right ventricular  size is normal. There is mildly elevated pulmonary artery systolic  pressure. The estimated right ventricular systolic pressure is 90.2 mmHg.   3. Left atrial size was moderately dilated.   4. Right atrial size was mildly dilated.    5. The mitral valve is normal in structure. Mild mitral valve  regurgitation.   6. Tricuspid valve regurgitation is moderate.   7. The aortic valve is tricuspid. There is mild calcification of the  aortic valve. There is mild thickening of the aortic valve. Aortic valve  regurgitation is trivial.   8. The inferior vena cava is normal in size with greater than 50%  respiratory variability, suggesting right atrial pressure of 3 mmHg.   Carotid US 11/14/21 Summary:  Right Carotid: Velocities in the right ICA are consistent with a 40-59%                 stenosis. Non-hemodynamically significant plaque <50% noted  in                 the CCA. The ECA appears >50% stenosed.   Left Carotid: Velocities in the left ICA are consistent with a 40-59%  stenosis.                Hemodynamically significant plaque >50% visualized in the  CCA. The                ECA appears >50% stenosed.   Vertebrals:  Left vertebral artery demonstrates antegrade flow. Right  vertebral               artery demonstrates retrograde flow.  Subclavians: Bilateral subclavian arteries were stenotic. Right subclavian               artery flow was disturbed.  There is a 47 mmHg pressure difference with the right arm being the  lowest. Retrograde flow is noted in the right vertebral artery. This is  suggestive for right subclavian steal syndrome. The right brachial artery  waveform is dampened and monophasic;  left brachial artery is brisk and biphasic.   TTE 01/10/21  Review of the above records today demonstrates:   1. Very frequent PACs during acquisition.   2. Left ventricular ejection fraction, by estimation, is 65 to 70%. The  left ventricle has normal function. The left ventricle has no regional  wall motion abnormalities. Left ventricular diastolic parameters are  consistent with Grade II diastolic  dysfunction (pseudonormalization). Elevated left ventricular end-diastolic  pressure.   3. Right ventricular  systolic function is normal. The right ventricular  size is normal. There is moderately elevated pulmonary artery systolic  pressure. The estimated right ventricular systolic pressure is 54.2 mmHg.   4. Left atrial size was moderately dilated.   5. Right atrial size was moderately dilated.   6. The mitral valve is normal in structure. Moderate mitral valve  regurgitation. No evidence of mitral stenosis.   7. Tricuspid valve regurgitation is severe.   8. The aortic valve is normal in structure. Aortic valve regurgitation is  not visualized. No aortic stenosis is present.   9. The inferior vena cava is normal in size with greater than 50%  respiratory variability, suggesting right atrial pressure of 3 mmHg.    Cardiac monitor 03/28/2021 personally reviewed Patient had a min HR of 40 bpm, max HR of 190 bpm, and avg HR of 51 bpm. Predominant underlying rhythm was Sinus Rhythm. 1 run of Ventricular Tachycardia occurred lasting 4 beats with a max rate of 190 bpm (avg 138 bpm). 380 Supraventricular Tachycardia runs occurred, the run with the fastest interval lasting 5 beats with a max rate of 190 bpm, the longest lasting 12 mins 13 secs with an avg rate of 146 bpm. Isolated SVEs were frequent (7.8%, 70623). Isolated VEs were rare (<1.0%). There were no patient triggered events.   Recent Labs: 10/10/2021: ALT 24; Hemoglobin 10.1; Platelets 240 10/11/2021: Magnesium 2.1; TSH 1.471 10/13/2021: BUN 26; Creatinine, Ser 1.26; Potassium 4.3; Sodium 124  No results found for requested labs within last 8760 hours.   CrCl cannot be calculated (Patient's most recent lab result is older than the maximum 21 days allowed.).   Wt Readings from Last 3 Encounters:  10/18/21 166 lb 3.2 oz (75.4 kg)  10/09/21 165 lb (74.8 kg)  08/02/21 168 lb 3.2 oz (76.3 kg)     Other studies reviewed: Additional studies/records reviewed today include: summarized above  ASSESSMENT AND PLAN:  Persistent Atrial  fibrillation CHA2DS2Vasc is 4, on Eliquis, appropriately dosed Rate controlled Asymptomatic  Re revisited the role of DCCV, the procedure, potential risks/benefits, they are not inclined to pursue it, at least at this juncture. Worried about his age and potential complications, they know someone that is did not work for or hold long.   Vasculopath CAD w/prior stents Subclavian stenosis with 50-2mmHg difference in arm pressures R<L RAS with prior stents Non-obstructive carotid disease Follows with Dr. Gwenlyn Found, sees him in march continue ASA   HTN Unusually high for him Pt is aware to only have BPs done left arm  4. Edema Off his diuretic Labs done by his PMD on 11/19/21 Na was 131 Discussed that preferably management of his ankle edema treat with support stockings, encouraged him to stay as active as he can/usually is. If he requires a diuretic would avoid chlorthalidone,. And would need to be with close monitoring of his labs. This best accomplished/easiest access is with his PMD and they will plan to see her/revisit this there.    Disposition: They will follow up his edema, BP with PMD, see Dr. Gwenlyn Found in [facility] and back with EP in June with Dr. Curt Bears   Current medicines are reviewed at length with the patient today.  The patient did not have any concerns regarding medicines.  Venetia Night, PA-C 11/24/2021 9:14 AM     [address] [place] [address] [phone] (office)  [phone] (fax)

## 2021-11-26 ENCOUNTER — Encounter: Payer: Self-pay | Admitting: Physician Assistant

## 2021-11-26 ENCOUNTER — Ambulatory Visit (INDEPENDENT_AMBULATORY_CARE_PROVIDER_SITE_OTHER): Payer: Medicare Other | Admitting: Physician Assistant

## 2021-11-26 ENCOUNTER — Other Ambulatory Visit: Payer: Self-pay

## 2021-11-26 VITALS — BP 142/80 | HR 79 | Ht 69.0 in | Wt 172.4 lb

## 2021-11-26 DIAGNOSIS — I2510 Atherosclerotic heart disease of native coronary artery without angina pectoris: Secondary | ICD-10-CM

## 2021-11-26 DIAGNOSIS — I4819 Other persistent atrial fibrillation: Secondary | ICD-10-CM | POA: Diagnosis not present

## 2021-11-26 DIAGNOSIS — Z79899 Other long term (current) drug therapy: Secondary | ICD-10-CM

## 2021-11-26 DIAGNOSIS — R609 Edema, unspecified: Secondary | ICD-10-CM

## 2021-11-26 DIAGNOSIS — I10 Essential (primary) hypertension: Secondary | ICD-10-CM

## 2021-11-26 NOTE — Patient Instructions (Signed)

## 2022-01-13 ENCOUNTER — Other Ambulatory Visit: Payer: Self-pay

## 2022-01-14 ENCOUNTER — Encounter: Payer: Self-pay | Admitting: Cardiovascular Disease

## 2022-01-14 ENCOUNTER — Other Ambulatory Visit: Payer: Self-pay

## 2022-01-14 ENCOUNTER — Ambulatory Visit (INDEPENDENT_AMBULATORY_CARE_PROVIDER_SITE_OTHER): Payer: Medicare Other | Admitting: Cardiovascular Disease

## 2022-01-14 VITALS — BP 140/82 | HR 85 | Ht 69.0 in | Wt 170.8 lb

## 2022-01-14 DIAGNOSIS — E782 Mixed hyperlipidemia: Secondary | ICD-10-CM

## 2022-01-14 DIAGNOSIS — I4819 Other persistent atrial fibrillation: Secondary | ICD-10-CM

## 2022-01-14 DIAGNOSIS — I6523 Occlusion and stenosis of bilateral carotid arteries: Secondary | ICD-10-CM

## 2022-01-14 DIAGNOSIS — I701 Atherosclerosis of renal artery: Secondary | ICD-10-CM | POA: Diagnosis not present

## 2022-01-14 DIAGNOSIS — I10 Essential (primary) hypertension: Secondary | ICD-10-CM

## 2022-01-14 DIAGNOSIS — I4821 Permanent atrial fibrillation: Secondary | ICD-10-CM

## 2022-01-14 DIAGNOSIS — I771 Stricture of artery: Secondary | ICD-10-CM | POA: Diagnosis not present

## 2022-01-14 DIAGNOSIS — I2510 Atherosclerotic heart disease of native coronary artery without angina pectoris: Secondary | ICD-10-CM | POA: Diagnosis not present

## 2022-01-14 NOTE — Assessment & Plan Note (Signed)
History of essential hypertension a blood pressure measured in his left arm of 140/82 today.  He is on metoprolol and valsartan.  It should be noted that he has right subclavian artery stenosis and blood pressure should only be measured in the left arm. ?

## 2022-01-14 NOTE — Assessment & Plan Note (Signed)
History of bilateral ICA stenosis by duplex ultrasound 11/14/2021.  This will be repeated on an annual basis. ?

## 2022-01-14 NOTE — Assessment & Plan Note (Signed)
History of bilateral renal artery stenosis status post stenting by Dr. Riley Churches 06/18/2006.  He did have renal Doppler studies performed 02/12/2021 that revealed widely patent stents bilaterally. ?

## 2022-01-14 NOTE — Assessment & Plan Note (Signed)
History of left greater than right subclavian artery stenosis with a 50 mm upper extremity blood pressure gradient left greater than right and retrograde right vertebral filling suggesting subclavian steal.  He has had stink syncope in the past.  He really denies dizziness or upper extremity claudication.  At this point, we will treat this conservatively. ?

## 2022-01-14 NOTE — Assessment & Plan Note (Signed)
History of hyperlipidemia on statin therapy with lipid profile performed 08/16/2021 revealing total cholesterol 126, LDL 56 and HDL 38. ?

## 2022-01-14 NOTE — Patient Instructions (Signed)
Medication Instructions:  ?Your physician recommends that you continue on your current medications as directed. Please refer to the Current Medication list given to you today. ? ?*If you need a refill on your cardiac medications before your next appointment, please call your pharmacy* ? ? ?Follow-Up: ?At [facility], you and your health needs are our priority.  As part of our continuing mission to provide you with exceptional heart care, we have created designated Provider Care Teams.  These Care Teams include your primary Cardiologist (physician) and Advanced Practice Providers (APPs -  Physician Assistants and Nurse Practitioners) who all work together to provide you with the care you need, when you need it. ? ?We recommend signing up for the patient portal called "MyChart".  Sign up information is provided on this After Visit Summary.  MyChart is used to connect with patients for Virtual Visits (Telemedicine).  Patients are able to view lab/test results, encounter notes, upcoming appointments, etc.  Non-urgent messages can be sent to your provider as well.   ?To learn more about what you can do with MyChart, go to [email or web address].   ? ?Your next appointment:   ?6 month(s) ? ?The format for your next appointment:   ?In Person ? ?Provider:   ?Jesse Cleaver, FNP, Angela Duke, PA-C, Callie Goodrich, PA-C, Jennifer, Lambert, PA-C, Kathryn Lawrence, DNP, ANP, or Hao Meng, PA-C     ? ? ?Then, Jonathan Berry, MD will plan to see you again in 12 month(s).  ?

## 2022-01-14 NOTE — Progress Notes (Signed)
? ? ? ?01/14/2022 ?HILLIS MCPHATTER   ?May 15, 1930  ?[id number] ? ?Primary Physician Mayra Neer, MD ?Primary Cardiologist: Lorretta Harp MD Lupe Carney, [place] ? ?HPI:  Edward [name] is a 88 y.o.  married Caucasian male father of 2 children, grandfather of 5 grandchildren who worked at [employer]  in the past. I last saw him in the office 08/02/2021.  His wife Edward [name] is also a patient of mine who unfortunately has recent been diagnosed with Alzheimer's.  She accompanies him today, as does one of his daughters Edward [name].  his primary care physician is Dr. Serita Grammes. He was referred by Dr. Casandra Doffing for peripheral vascular evaluation but has expressed a desire to follow up with me for his cardiology care as well in the future. He has a history of treated hypertension and hyperlipidemia. He has never smoked. Her father did have A. Myocardial infarction in his 24s. He had an myocardial function in October of 2001 and had 2 NIR bare metal stents placed in his RCA by Dr. Quay Burow 08/20/00. He has had renal artery stenting bilaterally performed by Dr. Rolm Baptise 06/18/06. He had carotid Dopplers performed recently that showed moderate bilateral internal carotid artery stenosis with incidentally noted bilateral subclavian artery disease. His vertebral antegrade on the left and apparent on the right. There was a 40-50 mm pressure differential with the right arm being less than the left.  He does measure his blood pressure twice a day and has noticed evening hypertension.  ?  ?He has lost hearing in his left ear on 05/08/2018 for unclear reasons.  Otherwise, he denies chest pain or shortness of breath.  He has had a UTI as well treated with ciprofloxacin seen in the ER because of this.  His major complaints are palpitations however.   ?  ?He has had a witnessed syncopal episode for unclear reasons.  A 2D echo was unrevealing with normal LV systolic function and grade 2 diastolic dysfunction.  He had moderate  pulmonary hypertension.  There is no evidence of valvular heart disease.  A 2-week Zio patch was ordered as well and was mailed in earlier this week.  He did have carotid Dopplers performed in January that showed moderate bilateral ICA stenosis with bilateral subclavian artery stenosis.  There was retrograde right vertebral filling with a 70 mm upper extremity blood pressure differential right lower than left. ? ?Since I saw him 6 months ago he continues to do well.  Unfortunately his wife Edward [name] continues to deteriorate with regards to short-term memory and dementia.  He is fairly active.  He denies chest pain or shortness of breath.  He has had no recurrent syncopal episodes.  He was hospitalized in December with symptomatic hyponatremia probably related to chlorthalidone which was discontinued.  He does have mild ankle edema. ?  ? ? ?Current Meds  ?Medication Sig  ? aspirin EC 81 MG tablet Take 1 tablet (81 mg total) by mouth daily.  ? atorvastatin (LIPITOR) 40 MG tablet Take 40 mg by mouth daily.  ? Calcium Carbonate-Vitamin D (CALCIUM PLUS VITAMIN D PO) Take 1 tablet by mouth daily.   ? cloNIDine (CATAPRES) 0.1 MG tablet TAKE 2 TABLETS BY MOUTH EVERY DAY (Patient taking differently: Take 0.2 mg by mouth daily.)  ? Coenzyme Q10 (CO Q-10) 100 MG CAPS Take 100 mg by mouth daily.  ? finasteride (PROSCAR) 5 MG tablet Take 5 mg by mouth daily.  ? fluticasone (FLONASE) 50 MCG/ACT nasal spray  Place 2 sprays into both nostrils daily as needed for allergies.  ? fluticasone (FLOVENT HFA) 44 MCG/ACT inhaler Inhale 2 puffs into the lungs in the morning and at bedtime.  ? folic acid (FOLVITE) 981 MCG tablet Take 400 mcg by mouth daily.  ? LORazepam (ATIVAN) 1 MG tablet Take 1 mg by mouth in the morning and at bedtime.  ? Multiple Vitamins-Minerals (PRESERVISION AREDS 2) CAPS Take by mouth.  ? Multiple Vitamins-Minerals (PRESERVISION AREDS 2+MULTI VIT PO) Take 1 tablet by mouth daily.  ? Omega-3 Fatty Acids (FISH OIL) 1000 MG  CAPS Take 1,200 mg by mouth daily.  ? omeprazole (PRILOSEC) 20 MG capsule Take 20 mg by mouth daily.  ? Polyethyl Glycol-Propyl Glycol 0.4-0.3 % SOLN Apply 1 drop to eye at bedtime as needed (dryness).  ? tamsulosin (FLOMAX) 0.4 MG CAPS capsule Take 0.4 mg by mouth daily.  ? triamcinolone cream (KENALOG) 0.1 % Apply 1 application topically as needed (irritation). Apply to both legs  ? valsartan (DIOVAN) 320 MG tablet TAKE 1/2 TABLET BY MOUTH 2 TIMES DAILY. PLEASE SCHEDULE AN APPT FOR FUTURE REFILLS  ?  ? ?Allergies  ?Allergen Reactions  ? Amlodipine Other (See Comments)  ?  Tolerates 2.5 mg, higher doses cause LEE with blistering  ? Erythromycin   ? Penicillins   ? Prednisone   ? ? ?Social History  ? ?Socioeconomic History  ? Marital status: Married  ?  Spouse name: Not on file  ? Number of children: Not on file  ? Years of education: Not on file  ? Highest education level: Not on file  ?Occupational History  ? Not on file  ?Tobacco Use  ? Smoking status: Never  ? Smokeless tobacco: Never  ?Vaping Use  ? Vaping Use: Never used  ?Substance and Sexual Activity  ? Alcohol use: No  ? Drug use: No  ? Sexual activity: Not on file  ?Other Topics Concern  ? Not on file  ?Social History Narrative  ? Not on file  ? ?Social Determinants of Health  ? ?Financial Resource Strain: Not on file  ?Food Insecurity: Not on file  ?Transportation Needs: Not on file  ?Physical Activity: Not on file  ?Stress: Not on file  ?Social Connections: Not on file  ?Intimate Partner Violence: Not on file  ?  ? ?Review of Systems: ?General: negative for chills, fever, night sweats or weight changes.  ?Cardiovascular: negative for chest pain, dyspnea on exertion, edema, orthopnea, palpitations, paroxysmal nocturnal dyspnea or shortness of breath ?Dermatological: negative for rash ?Respiratory: negative for cough or wheezing ?Urologic: negative for hematuria ?Abdominal: negative for nausea, vomiting, diarrhea, bright red blood per rectum, melena, or  hematemesis ?Neurologic: negative for visual changes, syncope, or dizziness ?All other systems reviewed and are otherwise negative except as noted above. ? ? ? ?Blood pressure 140/82, pulse 85, height '5\' 9"'$  (1.753 m), weight 170 lb 12.8 oz (77.5 kg), SpO2 99 %.  ?General appearance: alert and no distress ?Neck: no adenopathy, no JVD, supple, symmetrical, trachea midline, thyroid not enlarged, symmetric, no tenderness/mass/nodules, and bilateral carotid bruits ?Lungs: clear to auscultation bilaterally ?Heart: irregularly irregular rhythm ?Extremities: extremities normal, atraumatic, no cyanosis or edema ?Pulses: 2+ and symmetric ?Skin: Skin color, texture, turgor normal. No rashes or lesions ?Neurologic: Grossly normal ? ?EKG atrial fibrillation with a ventricular sponsor of 85, right bundle branch block.  I personally reviewed this EKG. ? ?ASSESSMENT AND PLAN:  ? ?Coronary atherosclerosis of native coronary artery ?History of CAD status post  myocardial infarction October 2001.  He had 2  NIR bare-metal stents placed in his RCA by Dr. Quay Burow 08/20/2000.  He denies chest pain or shortness of breath. ? ?Mixed hyperlipidemia ?History of hyperlipidemia on statin therapy with lipid profile performed 08/16/2021 revealing total cholesterol 126, LDL 56 and HDL 38. ? ?Carotid artery stenosis ?History of bilateral ICA stenosis by duplex ultrasound 11/14/2021.  This will be repeated on an annual basis. ? ?Essential hypertension, benign ?History of essential hypertension a blood pressure measured in his left arm of 140/82 today.  He is on metoprolol and valsartan.  It should be noted that he has right subclavian artery stenosis and blood pressure should only be measured in the left arm. ? ?Renal artery stenosis ([place]) ?History of bilateral renal artery stenosis status post stenting by Dr. Riley Churches 06/18/2006.  He did have renal Doppler studies performed 02/12/2021 that revealed widely patent stents  bilaterally. ? ?Subclavian artery stenosis ([facility]) ?History of left greater than right subclavian artery stenosis with a 50 mm upper extremity blood pressure gradient left greater than right and retrograde right vertebral filling sug

## 2022-01-14 NOTE — Assessment & Plan Note (Signed)
History of CAD status post myocardial infarction October 2001.  He had 2  NIR bare-metal stents placed in his RCA by Dr. Quay Burow 08/20/2000.  He denies chest pain or shortness of breath. ?

## 2022-01-14 NOTE — Assessment & Plan Note (Signed)
History of persistent AF on Eliquis oral anticoagulation.  We talked about DC cardioversion however at his age and with his lack of symptoms I prefer to keep him anticoagulated and rate controlled. ?

## 2022-02-02 ENCOUNTER — Other Ambulatory Visit: Payer: Self-pay | Admitting: Cardiovascular Disease

## 2022-02-17 ENCOUNTER — Telehealth: Payer: Self-pay | Admitting: Cardiovascular Disease

## 2022-02-17 NOTE — Telephone Encounter (Signed)
?*  STAT* If patient is at the pharmacy, call can be transferred to refill team. ? ? ?1. Which medications need to be refilled? (please list name of each medication and dose if known)  ? cloNIDine (CATAPRES) 0.1 MG tablet  ? ?2. Which pharmacy/location (including street and city if local pharmacy) is medication to be sent to? [id number] [place] [address] ? ?3. Do they need a 30 day or 90 day supply? 90 day ? ?

## 2022-02-24 ENCOUNTER — Other Ambulatory Visit: Payer: Self-pay

## 2022-02-25 ENCOUNTER — Telehealth: Payer: Self-pay | Admitting: Cardiovascular Disease

## 2022-02-25 DIAGNOSIS — I4891 Unspecified atrial fibrillation: Secondary | ICD-10-CM | POA: Diagnosis not present

## 2022-02-25 DIAGNOSIS — E782 Mixed hyperlipidemia: Secondary | ICD-10-CM | POA: Diagnosis not present

## 2022-02-25 DIAGNOSIS — D649 Anemia, unspecified: Secondary | ICD-10-CM | POA: Diagnosis not present

## 2022-02-25 DIAGNOSIS — I150 Renovascular hypertension: Secondary | ICD-10-CM | POA: Diagnosis not present

## 2022-02-25 DIAGNOSIS — N1832 Chronic kidney disease, stage 3b: Secondary | ICD-10-CM | POA: Diagnosis not present

## 2022-02-25 NOTE — Telephone Encounter (Signed)
Routed to [facility] CMA for follow up ?

## 2022-02-25 NOTE — Telephone Encounter (Signed)
Patient is calling stating his daughter received a VM from [facility] yesterday requesting he callback in regards to a medication. Please advise.  ?

## 2022-02-26 NOTE — Telephone Encounter (Signed)
Returned patients call and he stated that he did pick up his medication and don't need any right now. ?

## 2022-03-05 ENCOUNTER — Other Ambulatory Visit: Payer: Self-pay

## 2022-03-05 MED ORDER — METOPROLOL TARTRATE 75 MG PO TABS: 75.0000 mg | ORAL_TABLET | Freq: Two times a day (BID) | ORAL | 2 refills | Status: DC

## 2022-03-05 NOTE — Telephone Encounter (Signed)
Daughter is requesting this be sent today due to pt being out of medication.  ?

## 2022-04-22 ENCOUNTER — Telehealth: Payer: Self-pay | Admitting: Cardiovascular Disease

## 2022-04-22 MED ORDER — VALSARTAN 320 MG PO TABS: ORAL_TABLET | ORAL | 2 refills | Status: DC

## 2022-04-22 NOTE — Telephone Encounter (Signed)
*  STAT* If patient is at the pharmacy, call can be transferred to refill team.   1. Which medications need to be refilled? (please list name of each medication and dose if known) valsartan (DIOVAN) 320 MG tablet  2. Which pharmacy/location (including street and city if local pharmacy) is medication to be sent to? [id number] [place] [place] [address]  3. Do they need a 30 day or 90 day supply? 90 day supply   Only has 4 days of medication left, pharmacy said he could not pick up prescription til 06/30.

## 2022-05-07 DIAGNOSIS — H353131 Nonexudative age-related macular degeneration, bilateral, early dry stage: Secondary | ICD-10-CM | POA: Diagnosis not present

## 2022-06-02 ENCOUNTER — Telehealth: Payer: Self-pay | Admitting: Cardiovascular Disease

## 2022-06-02 NOTE — Telephone Encounter (Signed)
 *  STAT* If patient is at the pharmacy, call can be transferred to refill team.   1. Which medications need to be refilled? (please list name of each medication and dose if known)  Metoprolol Tartrate 75 MG TABS  2. Which pharmacy/location (including street and city if local pharmacy) is medication to be sent to? [id number] [place], [place] - [address]  3. Do they need a 30 day or 90 day supply? 90 days

## 2022-06-03 NOTE — Telephone Encounter (Signed)
Pt is calling back stating this medication still has not been called into his pharmacy. He states he only has 4 pills left of this medication. Requesting this be called in for him.

## 2022-06-04 ENCOUNTER — Other Ambulatory Visit: Payer: Self-pay | Admitting: Cardiovascular Disease

## 2022-06-04 ENCOUNTER — Other Ambulatory Visit: Payer: Self-pay

## 2022-06-04 MED ORDER — METOPROLOL TARTRATE 50 MG PO TABS
75.0000 mg | ORAL_TABLET | Freq: Two times a day (BID) | ORAL | 3 refills | Status: DC
Start: 2022-06-04 — End: 2022-06-04

## 2022-06-04 MED ORDER — METOPROLOL TARTRATE 50 MG PO TABS: 75.0000 mg | ORAL_TABLET | Freq: Two times a day (BID) | ORAL | 3 refills | Status: DC

## 2022-06-04 NOTE — Telephone Encounter (Signed)
Called patient back- advised pharmacy does not make 75 mg tablets of Metoprolol. However, I would send over 50 mg (1.5 tablets) to pharmacy.    I wanted to verify with MD- as last visit from hospital showed it on list, however visit with Dr.Berry did not reflect this medication and nothing in note or AVS about stopping. Is patient to be on the Metoprolol 75 mg BID?    Thanks!

## 2022-06-04 NOTE — Progress Notes (Signed)
Called patient back- advised pharmacy does not make 75 mg tablets of Metoprolol. However, I would send over 50 mg (1.5 tablets) to pharmacy.   I wanted to verify with MD- as last visit from hospital showed it on list, however visit with Dr.Berry did not reflect this medication and nothing in note or AVS about stopping. Is patient to be on the Metoprolol 75 mg BID?   Thanks!

## 2022-06-04 NOTE — Telephone Encounter (Signed)
Patient called stating the prescription for this medication has still not been called in and he is out of this medication.  Patient would like a callback to confirm the medication has been sent to the pharmacy.

## 2022-06-06 NOTE — Telephone Encounter (Signed)
I contacted patient back. He states that the RX that was sent to the pharmacy was a 50 mg tablet. I advised the instructions verbalized for him to take 1.5 tablets (75 mg) BID. I sent this to the pharmacy with those instructions.   I attempted to contact the pharmacy they were closed for lunch until 2:00 PM. I did advise with patient I had sent the RX- and if the pharmacy had issues or questions to have them call us.  Patient verbalized understanding

## 2022-06-06 NOTE — Telephone Encounter (Signed)
  Pt is calling back and requesting to speak with a nurse regarding his metoprolol. He ask if someone can call him back today as soon as possible

## 2022-06-20 NOTE — Progress Notes (Signed)
Cardiology Clinic Note   Patient Name: Edward [name] Date of Encounter: 06/27/2022  Primary Care Provider:  Mayra Neer, MD Primary Cardiologist:  Quay Burow, MD  Patient Profile    77 male with hx of HTN, HL, permanent atrial fibrillation on Eliquis, carotid artery disease with bilateral subclavian artery disease, pulmonary hypertension, Grade II diastolic dysfunction per echo.  Last seen by Dr. Gwenlyn Found 01/14/2022.   Past Medical History    Past Medical History:  Diagnosis Date   Atherosclerosis of abdominal aorta (HCC)    CT/abd and pelvis   Carotid artery occlusion    bilateral carotid bruit, right greater then left -bilateral 40-50% stenosis, 12/26/09- no change   Chronic anxiety    Coronary artery disease    s/p BM stent, prox and mid RCA, 2001, cutting ballon RCA stenosis, 2002   Dysphagia    from esophageal dysmotility-tx with careful eating.    History of echocardiogram    2/11 echo EF 70%, mild MR, mildly elevated pulmonary pressures 42 mmHg   History of renal angiogram    9/10, showed patent renal stents, 30-40% instent restenosis ws the most severe lesion   Hyperlipidemia    Left kidney mass    lower pole, observing by urology- Dr. Reece Agar   Peripheral neuropathy    in both feet from nerve compression    Renovascular hypertension    s./p. bilateral RA stent implant   Subclavian artery stenosis [facility])    Past Surgical History:  Procedure Laterality Date   CARDIAC CATHETERIZATION  2001   BM stent, prox and mid RCA   cataract surgery Bilateral 04/14   COLONOSCOPY     every 10 years   left foot surgery     RENAL ARTERY STENT  08/07    Allergies  Allergies  Allergen Reactions   Amlodipine Other (See Comments)    Tolerates 2.5 mg, higher doses cause LEE with blistering   Erythromycin    Penicillins    Prednisone     History of Present Illness    [name], is a very pleasant 88 year old male patient who presents today for ongoing assessment  and management of permanent atrial fibrillation, hyperlipidemia, carotid artery stenosis, and hypertension.  He comes with his daughter who is very helpful concerning history and support.  He offers no significant health complaints other than the fact that he gets tired more easily now.  He likes to stay busy and work in his yard.  He uses a riding lawnmower but is unable to do the detail work without becoming more tired.  He does have friends who are willing to help him.  He also has his wife who has dementia that he is caring for.  This has caused some stress for him.  He is medically compliant, labs are followed by PCP.  He offers no complaints of bleeding, melena, or hemoptysis.  However he does report an external hemorrhoid which occasionally gives him trouble with mild bleeding but not severe.  He does have occasional lower extremity edema unless he keeps his feet elevated or uses support hose.  Home Medications    Current Outpatient Medications  Medication Sig Dispense Refill   aspirin EC 81 MG tablet Take 1 tablet (81 mg total) by mouth daily. 90 tablet 3   Calcium Carbonate-Vitamin D (CALCIUM PLUS VITAMIN D PO) Take 1 tablet by mouth daily.      cloNIDine (CATAPRES) 0.1 MG tablet TAKE 2 TABLETS BY MOUTH EVERY DAY 180 tablet 3  Coenzyme Q10 (CO Q-10) 100 MG CAPS Take 100 mg by mouth daily.     finasteride (PROSCAR) 5 MG tablet Take 5 mg by mouth daily.     fluticasone (FLONASE) 50 MCG/ACT nasal spray Place 2 sprays into both nostrils daily as needed for allergies.     fluticasone (FLOVENT HFA) 44 MCG/ACT inhaler Inhale 2 puffs into the lungs in the morning and at bedtime.     folic acid (FOLVITE) 409 MCG tablet Take 400 mcg by mouth daily.     LORazepam (ATIVAN) 1 MG tablet Take 1 mg by mouth in the morning and at bedtime.     Multiple Vitamins-Minerals (PRESERVISION AREDS 2) CAPS Take by mouth.     Multiple Vitamins-Minerals (PRESERVISION AREDS 2+MULTI VIT PO) Take 1 tablet by mouth  daily.     Omega-3 Fatty Acids (FISH OIL) 1000 MG CAPS Take 1,200 mg by mouth daily.     omeprazole (PRILOSEC) 20 MG capsule Take 20 mg by mouth daily.     Polyethyl Glycol-Propyl Glycol 0.4-0.3 % SOLN Apply 1 drop to eye at bedtime as needed (dryness).     tamsulosin (FLOMAX) 0.4 MG CAPS capsule Take 0.4 mg by mouth daily.     triamcinolone cream (KENALOG) 0.1 % Apply 1 application topically as needed (irritation). Apply to both legs     VALERIAN ROOT PO Take 300 mg by mouth as needed (Take as needed for nerves).     apixaban (ELIQUIS) 5 MG TABS tablet Take 1 tablet (5 mg total) by mouth 2 (two) times daily. 180 tablet 3   atorvastatin (LIPITOR) 40 MG tablet Take 1 tablet (40 mg total) by mouth daily. 90 tablet 3   Azelastine HCl 137 MCG/SPRAY SOLN SMARTSIG:1-2 Spray(s) Both Nares Twice Daily     metoprolol tartrate (LOPRESSOR) 50 MG tablet Take 1.5 tablets (75 mg total) by mouth 2 (two) times daily. 180 tablet 3   valsartan (DIOVAN) 320 MG tablet TAKE 1/2 TABLET BY MOUTH 2 TIMES DAILY. 90 tablet 3   No current facility-administered medications for this visit.     Family History    Family History  Problem Relation Age of Onset   Hypertension Mother    Hypertension Father    He indicated that his mother is deceased. He indicated that his father is deceased. He indicated that his sister is alive. He indicated that his maternal grandmother is deceased. He indicated that his maternal grandfather is deceased. He indicated that his paternal grandmother is deceased. He indicated that his paternal grandfather is deceased.  Social History    Social History   Socioeconomic History   Marital status: Married    Spouse name: Not on file   Number of children: Not on file   Years of education: Not on file   Highest education level: Not on file  Occupational History   Not on file  Tobacco Use   Smoking status: Never   Smokeless tobacco: Never  Vaping Use   Vaping Use: Never used  Substance  and Sexual Activity   Alcohol use: No   Drug use: No   Sexual activity: Not on file  Other Topics Concern   Not on file  Social History Narrative   Not on file   Social Determinants of Health   Financial Resource Strain: Not on file  Food Insecurity: Not on file  Transportation Needs: Not on file  Physical Activity: Not on file  Stress: Not on file  Social Connections: Not on file  Intimate Partner Violence: Not on file     Review of Systems    General:  No chills, fever, night sweats or weight changes.  Cardiovascular:  No chest pain, dyspnea on exertion, edema, orthopnea, palpitations, paroxysmal nocturnal dyspnea. Dermatological: No rash, lesions/masses Respiratory: No cough, dyspnea Urologic: No hematuria, dysuria Abdominal:   No nausea, vomiting, diarrhea, bright red blood per rectum, melena, or hematemesis Neurologic:  No visual changes, wkns, changes in mental status. All other systems reviewed and are otherwise negative except as noted above.     Physical Exam    VS:  BP (!) 140/64   Pulse 82   Ht '5\' 9"'$  (1.753 m)   Wt 170 lb 12.8 oz (77.5 kg)   SpO2 100%   BMI 25.22 kg/m  , BMI Body mass index is 25.22 kg/m.     GEN: Well nourished, well developed, in no acute distress.  He wears glasses. HEENT: normal. Neck: Supple, no JVD, carotid bruits, or masses. Cardiac: RRR, 2/6 systolic heard best at the left sternal border, murmurs, rubs, or gallops. No clubbing, cyanosis, edema.  Radials/DP/PT 2+ and equal bilaterally.  Respiratory:  Respirations regular and unlabored, clear to auscultation bilaterally. GI: Soft, nontender, nondistended, BS + x 4. MS: no deformity or atrophy. Skin: warm and dry, no rash. Neuro:  Strength and sensation are intact. Psych: Normal affect.  Accessory Clinical Findings    ECG personally reviewed by me today-atrial fibrillation, rate of 82 bpm with right bundle branch block.- No acute changes when compared to EKG on  01/14/2022.  Lab Results  Component Value Date   WBC 5.9 10/10/2021   HGB 10.1 (L) 10/10/2021   HCT 27.9 (L) 10/10/2021   MCV 88.3 10/10/2021   PLT 240 10/10/2021   Lab Results  Component Value Date   CREATININE 1.26 (H) 10/13/2021   BUN 26 (H) 10/13/2021   NA 124 (L) 10/13/2021   K 4.3 10/13/2021   CL 94 (L) 10/13/2021   CO2 24 10/13/2021   Lab Results  Component Value Date   ALT 24 10/10/2021   AST 28 10/10/2021   ALKPHOS 76 10/10/2021   BILITOT 0.8 10/10/2021   Lab Results  Component Value Date   CHOL 110 03/13/2014   HDL 40.50 03/13/2014   LDLCALC 51 03/13/2014   TRIG 91.0 03/13/2014   CHOLHDL 3 03/13/2014    No results found for: "HGBA1C"  Review of Prior Studies: Carotid Duplex Ultrasound 11/14/2021 Right Carotid: Velocities in the right ICA are consistent with a 40-59%                 stenosis. Non-hemodynamically significant plaque <50% noted  In the CCA. The ECA appears >50% stenosed.   Left Carotid: Velocities in the left ICA are consistent with a 40-59%  stenosis. Hemodynamically significant plaque >50% visualized in the  CCA. The ECA appears >50% stenosed.   Vertebrals:  Left vertebral artery demonstrates antegrade flow. Right  vertebral artery demonstrates retrograde flow.  Subclavians: Bilateral subclavian arteries were stenotic. Right subclavian artery flow was disturbed.   Echocardiogram 10/10/2021 1. Left ventricular ejection fraction, by estimation, is 60 to 65%. The  left ventricle has normal function. The left ventricle has no regional  wall motion abnormalities. There is mild concentric left ventricular  hypertrophy. Diastolic function is  indeterminant due to atrial fibrillation.   2. Right ventricular systolic function is normal. The right ventricular  size is normal. There is mildly elevated pulmonary artery systolic  pressure. The estimated  right ventricular systolic pressure is 29.5 mmHg.   3. Left atrial size was moderately dilated.    4. Right atrial size was mildly dilated.   5. The mitral valve is normal in structure. Mild mitral valve  regurgitation.   6. Tricuspid valve regurgitation is moderate.   7. The aortic valve is tricuspid. There is mild calcification of the  aortic valve. There is mild thickening of the aortic valve. Aortic valve  regurgitation is trivial.   8. The inferior vena cava is normal in size with greater than 50%  respiratory variability, suggesting right atrial pressure of 3 mmHg.   Assessment & Plan   1.  Permanent atrial fibrillation: Rate is well controlled on metoprolol tartrate 75 mg twice daily.  He also continues on apixaban 5 mg twice daily.  CHADS VASC Score of 3. Most recent labs available from 10/13/2021 creatinine 1.26, GFR 56.  2.  Hypertension: Blood pressure not completely optimal at 140/64.  He is on clonidine 0.1 mg twice daily, metoprolol, and valsartan 320 mg 1/2 tablet twice daily.  We will need to keep track of home blood pressures and would like to keep his blood pressure less than 140/70 if possible.  We will continue to monitor this.  Echocardiogram completed 10/10/2021 reveale mild concentric left hypertrophy.  He offers no evidence of volume overload at this time.  He can wear support hose for dependent edema.  3.  Carotid artery disease: Most recent carotid duplex ultrasound on 11/14/2021 revealed left ICA 40 to 59% stenosis, with hemodynamically significant plaque greater than 50% visualized in the CCA.  Right carotid 40 to 59% stenosis with nonhemodynamically significant plaque noted in the CCA.  Continue statin therapy.   4 Hypercholesterolemia: Remains on atorvastatin 40 mg daily.  Labs are followed by his primary care provider which are going to be drawn in October 2023.  Goal of LDL less than 70 with PAD.  Current medicines are reviewed at length with the patient today.  I have spent 30 min's  dedicated to the care of this patient on the date of this encounter to  include pre-visit review of records, assessment, management and diagnostic testing,with shared decision making.  Signed, Phill Myron. West Pugh, ANP, [place]   06/27/2022 12:51 PM      Office [phone] Fax [phone]  Notice: This dictation was prepared with Dragon dictation along with smaller phrase technology. Any transcriptional errors that result from this process are unintentional and may not be corrected upon review.

## 2022-06-24 DIAGNOSIS — J3489 Other specified disorders of nose and nasal sinuses: Secondary | ICD-10-CM | POA: Diagnosis not present

## 2022-06-24 DIAGNOSIS — I150 Renovascular hypertension: Secondary | ICD-10-CM | POA: Diagnosis not present

## 2022-06-27 ENCOUNTER — Ambulatory Visit: Payer: Medicare Other | Admitting: Adult Health

## 2022-06-27 ENCOUNTER — Encounter: Payer: Self-pay | Admitting: Adult Health

## 2022-06-27 VITALS — BP 140/64 | HR 82 | Ht 69.0 in | Wt 170.8 lb

## 2022-06-27 DIAGNOSIS — I10 Essential (primary) hypertension: Secondary | ICD-10-CM | POA: Diagnosis not present

## 2022-06-27 DIAGNOSIS — I6523 Occlusion and stenosis of bilateral carotid arteries: Secondary | ICD-10-CM

## 2022-06-27 DIAGNOSIS — E7800 Pure hypercholesterolemia, unspecified: Secondary | ICD-10-CM | POA: Diagnosis not present

## 2022-06-27 DIAGNOSIS — I4821 Permanent atrial fibrillation: Secondary | ICD-10-CM | POA: Diagnosis not present

## 2022-06-27 MED ORDER — APIXABAN 5 MG PO TABS: 5.0000 mg | ORAL_TABLET | Freq: Two times a day (BID) | ORAL | 3 refills | Status: AC

## 2022-06-27 MED ORDER — VALSARTAN 320 MG PO TABS: ORAL_TABLET | ORAL | 3 refills | Status: DC

## 2022-06-27 MED ORDER — METOPROLOL TARTRATE 50 MG PO TABS: 75.0000 mg | ORAL_TABLET | Freq: Two times a day (BID) | ORAL | 3 refills | Status: DC

## 2022-06-27 MED ORDER — ATORVASTATIN CALCIUM 40 MG PO TABS: 40.0000 mg | ORAL_TABLET | Freq: Every day | ORAL | 3 refills | Status: DC

## 2022-06-27 NOTE — Patient Instructions (Signed)
Medication Instructions:  Your physician recommends that you continue on your current medications as directed. Please refer to the Current Medication list given to you today.  *If you need a refill on your cardiac medications before your next appointment, please call your pharmacy*   Lab Work: NONE If you have labs (blood work) drawn today and your tests are completely normal, you will receive your results only by: [place] (if you have MyChart) OR A paper copy in the mail If you have any lab test that is abnormal or we need to change your treatment, we will call you to review the results.   Testing/Procedures: NONE   Follow-Up: At [facility], you and your health needs are our priority.  As part of our continuing mission to provide you with exceptional heart care, we have created designated Provider Care Teams.  These Care Teams include your primary Cardiologist (physician) and Advanced Practice Providers (APPs -  Physician Assistants and Nurse Practitioners) who all work together to provide you with the care you need, when you need it.  We recommend signing up for the patient portal called "MyChart".  Sign up information is provided on this After Visit Summary.  MyChart is used to connect with patients for Virtual Visits (Telemedicine).  Patients are able to view lab/test results, encounter notes, upcoming appointments, etc.  Non-urgent messages can be sent to your provider as well.   To learn more about what you can do with MyChart, go to [email or web address].    Your next appointment:   6 month(s)  The format for your next appointment:   In Person  Provider:   Quay Burow, MD

## 2022-08-15 ENCOUNTER — Telehealth: Payer: Self-pay | Admitting: Cardiovascular Disease

## 2022-08-15 MED ORDER — CLONIDINE HCL 0.1 MG PO TABS: 0.2000 mg | ORAL_TABLET | Freq: Every day | ORAL | 3 refills | Status: DC

## 2022-08-15 NOTE — Telephone Encounter (Signed)
Refill complete 

## 2022-08-15 NOTE — Telephone Encounter (Signed)
 *  STAT* If patient is at the pharmacy, call can be transferred to refill team.   1. Which medications need to be refilled? (please list name of each medication and dose if known) cloNIDine (CATAPRES) 0.1 MG tablet  2. Which pharmacy/location (including street and city if local pharmacy) is medication to be sent to? [id number]- [place], [place] - [address]  3. Do they need a 30 day or 90 day supply? 90 days

## 2022-08-29 DIAGNOSIS — I150 Renovascular hypertension: Secondary | ICD-10-CM | POA: Diagnosis not present

## 2022-08-29 DIAGNOSIS — E782 Mixed hyperlipidemia: Secondary | ICD-10-CM | POA: Diagnosis not present

## 2022-08-29 DIAGNOSIS — I25119 Atherosclerotic heart disease of native coronary artery with unspecified angina pectoris: Secondary | ICD-10-CM | POA: Diagnosis not present

## 2022-08-29 DIAGNOSIS — G4700 Insomnia, unspecified: Secondary | ICD-10-CM | POA: Diagnosis not present

## 2022-08-29 DIAGNOSIS — I700 Atherosclerosis of aorta: Secondary | ICD-10-CM | POA: Diagnosis not present

## 2022-08-29 DIAGNOSIS — N183 Chronic kidney disease, stage 3 unspecified: Secondary | ICD-10-CM | POA: Diagnosis not present

## 2022-08-29 DIAGNOSIS — Z0000 Encounter for general adult medical examination without abnormal findings: Secondary | ICD-10-CM | POA: Diagnosis not present

## 2022-08-29 DIAGNOSIS — I779 Disorder of arteries and arterioles, unspecified: Secondary | ICD-10-CM | POA: Diagnosis not present

## 2022-08-29 DIAGNOSIS — I701 Atherosclerosis of renal artery: Secondary | ICD-10-CM | POA: Diagnosis not present

## 2022-08-29 DIAGNOSIS — I4891 Unspecified atrial fibrillation: Secondary | ICD-10-CM | POA: Diagnosis not present

## 2022-09-17 ENCOUNTER — Encounter (HOSPITAL_COMMUNITY): Payer: Self-pay | Admitting: Internal Medicine

## 2022-09-17 ENCOUNTER — Emergency Department (HOSPITAL_COMMUNITY): Payer: Medicare Other

## 2022-09-17 ENCOUNTER — Inpatient Hospital Stay (HOSPITAL_COMMUNITY)
Admission: EM | Admit: 2022-09-17 | Discharge: 2022-09-21 | DRG: 193 | Disposition: A | Discharge status: Home / Self Care | Payer: Medicare Other | Attending: Internal Medicine | Admitting: Internal Medicine

## 2022-09-17 ENCOUNTER — Observation Stay (HOSPITAL_COMMUNITY): Payer: Medicare Other

## 2022-09-17 ENCOUNTER — Other Ambulatory Visit: Payer: Self-pay

## 2022-09-17 DIAGNOSIS — Z7982 Long term (current) use of aspirin: Secondary | ICD-10-CM | POA: Diagnosis not present

## 2022-09-17 DIAGNOSIS — E871 Hypo-osmolality and hyponatremia: Secondary | ICD-10-CM | POA: Diagnosis present

## 2022-09-17 DIAGNOSIS — J189 Pneumonia, unspecified organism: Secondary | ICD-10-CM | POA: Diagnosis not present

## 2022-09-17 DIAGNOSIS — E876 Hypokalemia: Secondary | ICD-10-CM | POA: Diagnosis not present

## 2022-09-17 DIAGNOSIS — Z955 Presence of coronary angioplasty implant and graft: Secondary | ICD-10-CM

## 2022-09-17 DIAGNOSIS — I509 Heart failure, unspecified: Secondary | ICD-10-CM | POA: Diagnosis not present

## 2022-09-17 DIAGNOSIS — E785 Hyperlipidemia, unspecified: Secondary | ICD-10-CM | POA: Diagnosis present

## 2022-09-17 DIAGNOSIS — E1122 Type 2 diabetes mellitus with diabetic chronic kidney disease: Secondary | ICD-10-CM | POA: Diagnosis present

## 2022-09-17 DIAGNOSIS — I4891 Unspecified atrial fibrillation: Secondary | ICD-10-CM | POA: Diagnosis not present

## 2022-09-17 DIAGNOSIS — Z743 Need for continuous supervision: Secondary | ICD-10-CM | POA: Diagnosis not present

## 2022-09-17 DIAGNOSIS — K5289 Other specified noninfective gastroenteritis and colitis: Secondary | ICD-10-CM | POA: Diagnosis present

## 2022-09-17 DIAGNOSIS — N1831 Chronic kidney disease, stage 3a: Secondary | ICD-10-CM

## 2022-09-17 DIAGNOSIS — I4510 Unspecified right bundle-branch block: Secondary | ICD-10-CM | POA: Diagnosis present

## 2022-09-17 DIAGNOSIS — I361 Nonrheumatic tricuspid (valve) insufficiency: Secondary | ICD-10-CM | POA: Diagnosis present

## 2022-09-17 DIAGNOSIS — I071 Rheumatic tricuspid insufficiency: Secondary | ICD-10-CM | POA: Diagnosis not present

## 2022-09-17 DIAGNOSIS — R6889 Other general symptoms and signs: Secondary | ICD-10-CM | POA: Diagnosis not present

## 2022-09-17 DIAGNOSIS — Z79899 Other long term (current) drug therapy: Secondary | ICD-10-CM

## 2022-09-17 DIAGNOSIS — I10 Essential (primary) hypertension: Secondary | ICD-10-CM | POA: Diagnosis not present

## 2022-09-17 DIAGNOSIS — I2583 Coronary atherosclerosis due to lipid rich plaque: Secondary | ICD-10-CM | POA: Diagnosis present

## 2022-09-17 DIAGNOSIS — R7989 Other specified abnormal findings of blood chemistry: Secondary | ICD-10-CM

## 2022-09-17 DIAGNOSIS — Z7901 Long term (current) use of anticoagulants: Secondary | ICD-10-CM

## 2022-09-17 DIAGNOSIS — I5033 Acute on chronic diastolic (congestive) heart failure: Secondary | ICD-10-CM | POA: Diagnosis present

## 2022-09-17 DIAGNOSIS — I110 Hypertensive heart disease with heart failure: Secondary | ICD-10-CM | POA: Diagnosis not present

## 2022-09-17 DIAGNOSIS — R0602 Shortness of breath: Secondary | ICD-10-CM | POA: Diagnosis not present

## 2022-09-17 DIAGNOSIS — I4821 Permanent atrial fibrillation: Secondary | ICD-10-CM | POA: Diagnosis present

## 2022-09-17 DIAGNOSIS — R0600 Dyspnea, unspecified: Secondary | ICD-10-CM | POA: Diagnosis not present

## 2022-09-17 DIAGNOSIS — I272 Pulmonary hypertension, unspecified: Secondary | ICD-10-CM | POA: Diagnosis not present

## 2022-09-17 DIAGNOSIS — Z20822 Contact with and (suspected) exposure to covid-19: Secondary | ICD-10-CM | POA: Diagnosis present

## 2022-09-17 DIAGNOSIS — I499 Cardiac arrhythmia, unspecified: Secondary | ICD-10-CM | POA: Diagnosis not present

## 2022-09-17 DIAGNOSIS — I2510 Atherosclerotic heart disease of native coronary artery without angina pectoris: Secondary | ICD-10-CM | POA: Diagnosis not present

## 2022-09-17 DIAGNOSIS — Z8249 Family history of ischemic heart disease and other diseases of the circulatory system: Secondary | ICD-10-CM | POA: Diagnosis not present

## 2022-09-17 DIAGNOSIS — R1110 Vomiting, unspecified: Secondary | ICD-10-CM | POA: Diagnosis not present

## 2022-09-17 DIAGNOSIS — I5031 Acute diastolic (congestive) heart failure: Principal | ICD-10-CM

## 2022-09-17 LAB — COMPREHENSIVE METABOLIC PANEL
ALT: 37 U/L (ref 0–44)
AST: 38 U/L (ref 15–41)
Albumin: 3 g/dL — ABNORMAL LOW (ref 3.5–5.0)
Alkaline Phosphatase: 109 U/L (ref 38–126)
Anion gap: 13 (ref 5–15)
BUN: 32 mg/dL — ABNORMAL HIGH (ref 8–23)
CO2: 24 mmol/L (ref 22–32)
Calcium: 8.6 mg/dL — ABNORMAL LOW (ref 8.9–10.3)
Chloride: 95 mmol/L — ABNORMAL LOW (ref 98–111)
Creatinine, Ser: 1.44 mg/dL — ABNORMAL HIGH (ref 0.61–1.24)
GFR, Estimated: 46 mL/min — ABNORMAL LOW (ref >60)
Glucose, Bld: 127 mg/dL — ABNORMAL HIGH (ref 70–99)
Potassium: 4.4 mmol/L (ref 3.5–5.1)
Sodium: 132 mmol/L — ABNORMAL LOW (ref 135–145)
Total Bilirubin: 0.8 mg/dL (ref 0.3–1.2)
Total Protein: 6.6 g/dL (ref 6.5–8.1)

## 2022-09-17 LAB — CBC WITH DIFFERENTIAL/PLATELET
Abs Immature Granulocytes: 0.04 10*3/uL (ref 0.00–0.07)
Basophils Absolute: 0 10*3/uL (ref 0.0–0.1)
Basophils Relative: 0 %
Eosinophils Absolute: 0 10*3/uL (ref 0.0–0.5)
Eosinophils Relative: 0 %
HCT: 32.5 % — ABNORMAL LOW (ref 39.0–52.0)
Hemoglobin: 10.8 g/dL — ABNORMAL LOW (ref 13.0–17.0)
Immature Granulocytes: 0 %
Lymphocytes Relative: 2 %
Lymphs Abs: 0.2 10*3/uL — ABNORMAL LOW (ref 0.7–4.0)
MCH: 30.8 pg (ref 26.0–34.0)
MCHC: 33.2 g/dL (ref 30.0–36.0)
MCV: 92.6 fL (ref 80.0–100.0)
Monocytes Absolute: 0.6 10*3/uL (ref 0.1–1.0)
Monocytes Relative: 5 %
Neutro Abs: 10.9 10*3/uL — ABNORMAL HIGH (ref 1.7–7.7)
Neutrophils Relative %: 93 %
Platelets: 146 10*3/uL — ABNORMAL LOW (ref 150–400)
RBC: 3.51 MIL/uL — ABNORMAL LOW (ref 4.22–5.81)
RDW: 13.6 % (ref 11.5–15.5)
WBC: 11.8 10*3/uL — ABNORMAL HIGH (ref 4.0–10.5)
nRBC: 0 % (ref 0.0–0.2)

## 2022-09-17 LAB — TROPONIN I (HIGH SENSITIVITY)
Troponin I (High Sensitivity): 17 ng/L (ref <18)
Troponin I (High Sensitivity): 25 ng/L — ABNORMAL HIGH (ref <18)

## 2022-09-17 LAB — PROTIME-INR
INR: 1.6 — ABNORMAL HIGH (ref 0.8–1.2)
Prothrombin Time: 19 seconds — ABNORMAL HIGH (ref 11.4–15.2)

## 2022-09-17 LAB — URINALYSIS, ROUTINE W REFLEX MICROSCOPIC
Bacteria, UA: NONE SEEN
Bilirubin Urine: NEGATIVE
Glucose, UA: NEGATIVE mg/dL
Hgb urine dipstick: NEGATIVE
Ketones, ur: NEGATIVE mg/dL
Leukocytes,Ua: NEGATIVE
Nitrite: NEGATIVE
Protein, ur: 100 mg/dL — AB
Specific Gravity, Urine: 1.017 (ref 1.005–1.030)
pH: 5 (ref 5.0–8.0)

## 2022-09-17 LAB — STREP PNEUMONIAE URINARY ANTIGEN: Strep Pneumo Urinary Antigen: NEGATIVE

## 2022-09-17 LAB — BRAIN NATRIURETIC PEPTIDE: B Natriuretic Peptide: 712.6 pg/mL — ABNORMAL HIGH (ref 0.0–100.0)

## 2022-09-17 LAB — LIPASE, BLOOD: Lipase: 27 U/L (ref 11–51)

## 2022-09-17 LAB — SARS CORONAVIRUS 2 BY RT PCR: SARS Coronavirus 2 by RT PCR: NEGATIVE

## 2022-09-17 MED ORDER — ONDANSETRON HCL 4 MG/2ML IJ SOLN
4.0000 mg | Freq: Once | INTRAMUSCULAR | Status: AC
  Administered 2022-09-17: 4 mg via INTRAVENOUS
  Filled 2022-09-17: qty 2

## 2022-09-17 MED ORDER — LACTATED RINGERS IV BOLUS
500.0000 mL | Freq: Once | INTRAVENOUS | Status: AC
  Administered 2022-09-17: 500 mL via INTRAVENOUS

## 2022-09-17 MED ORDER — SENNOSIDES-DOCUSATE SODIUM 8.6-50 MG PO TABS
1.0000 | ORAL_TABLET | Freq: Two times a day (BID) | ORAL | Status: DC
  Administered 2022-09-18 – 2022-09-20 (×4): 1 via ORAL
  Filled 2022-09-17 (×5): qty 1

## 2022-09-17 MED ORDER — APIXABAN 5 MG PO TABS
5.0000 mg | ORAL_TABLET | Freq: Two times a day (BID) | ORAL | Status: DC
  Administered 2022-09-17 – 2022-09-21 (×9): 5 mg via ORAL
  Filled 2022-09-17 (×9): qty 1

## 2022-09-17 MED ORDER — AZITHROMYCIN 250 MG PO TABS: 500.0000 mg | ORAL_TABLET | Freq: Every day | ORAL | Status: DC

## 2022-09-17 MED ORDER — FUROSEMIDE 10 MG/ML IJ SOLN
20.0000 mg | Freq: Once | INTRAMUSCULAR | Status: AC
  Administered 2022-09-17: 20 mg via INTRAVENOUS
  Filled 2022-09-17: qty 2

## 2022-09-17 MED ORDER — TAMSULOSIN HCL 0.4 MG PO CAPS
0.4000 mg | ORAL_CAPSULE | Freq: Every day | ORAL | Status: DC
  Administered 2022-09-17 – 2022-09-21 (×5): 0.4 mg via ORAL
  Filled 2022-09-17 (×5): qty 1

## 2022-09-17 MED ORDER — IRBESARTAN 75 MG PO TABS: 75.0000 mg | ORAL_TABLET | Freq: Every day | ORAL | Status: DC

## 2022-09-17 MED ORDER — DOXYCYCLINE HYCLATE 100 MG PO TABS
100.0000 mg | ORAL_TABLET | Freq: Once | ORAL | Status: AC
  Administered 2022-09-17: 100 mg via ORAL
  Filled 2022-09-17: qty 1

## 2022-09-17 MED ORDER — BISACODYL 10 MG RE SUPP: 10.0000 mg | Freq: Once | RECTAL | Status: DC

## 2022-09-17 MED ORDER — ACETAMINOPHEN 650 MG RE SUPP: 650.0000 mg | Freq: Four times a day (QID) | RECTAL | Status: DC | PRN

## 2022-09-17 MED ORDER — ONDANSETRON HCL 4 MG/2ML IJ SOLN
4.0000 mg | Freq: Four times a day (QID) | INTRAMUSCULAR | Status: DC | PRN
  Administered 2022-09-20: 4 mg via INTRAVENOUS
  Filled 2022-09-17: qty 2

## 2022-09-17 MED ORDER — ACETAMINOPHEN 325 MG PO TABS: 650.0000 mg | ORAL_TABLET | Freq: Four times a day (QID) | ORAL | Status: DC | PRN

## 2022-09-17 MED ORDER — CLONIDINE HCL 0.2 MG PO TABS: 0.2000 mg | ORAL_TABLET | Freq: Every day | ORAL | Status: DC

## 2022-09-17 MED ORDER — ASPIRIN 81 MG PO TBEC
81.0000 mg | DELAYED_RELEASE_TABLET | Freq: Every day | ORAL | Status: DC
  Administered 2022-09-17 – 2022-09-21 (×5): 81 mg via ORAL
  Filled 2022-09-17 (×5): qty 1

## 2022-09-17 MED ORDER — ORAL CARE MOUTH RINSE: 15.0000 mL | OROMUCOSAL | Status: DC | PRN

## 2022-09-17 MED ORDER — PANTOPRAZOLE SODIUM 40 MG PO TBEC
40.0000 mg | DELAYED_RELEASE_TABLET | Freq: Every day | ORAL | Status: DC
  Administered 2022-09-17 – 2022-09-21 (×5): 40 mg via ORAL
  Filled 2022-09-17 (×5): qty 1

## 2022-09-17 MED ORDER — SODIUM CHLORIDE 0.9 % IV SOLN
100.0000 mg | Freq: Two times a day (BID) | INTRAVENOUS | Status: DC
  Administered 2022-09-17 (×2): 100 mg via INTRAVENOUS
  Filled 2022-09-17 (×5): qty 100

## 2022-09-17 MED ORDER — FINASTERIDE 5 MG PO TABS
5.0000 mg | ORAL_TABLET | Freq: Every day | ORAL | Status: DC
  Administered 2022-09-17 – 2022-09-21 (×5): 5 mg via ORAL
  Filled 2022-09-17 (×5): qty 1

## 2022-09-17 MED ORDER — ONDANSETRON HCL 4 MG PO TABS: 4.0000 mg | ORAL_TABLET | Freq: Four times a day (QID) | ORAL | Status: DC | PRN

## 2022-09-17 MED ORDER — SODIUM CHLORIDE 0.9 % IV SOLN
2.0000 g | INTRAVENOUS | Status: AC
  Administered 2022-09-17 – 2022-09-21 (×5): 2 g via INTRAVENOUS
  Filled 2022-09-17 (×5): qty 20

## 2022-09-17 MED ORDER — ATORVASTATIN CALCIUM 40 MG PO TABS
40.0000 mg | ORAL_TABLET | Freq: Every day | ORAL | Status: DC
  Administered 2022-09-17 – 2022-09-21 (×5): 40 mg via ORAL
  Filled 2022-09-17 (×5): qty 1

## 2022-09-17 MED ORDER — SODIUM CHLORIDE 0.9 % IV SOLN
1.0000 g | Freq: Once | INTRAVENOUS | Status: AC
  Administered 2022-09-17: 1 g via INTRAVENOUS
  Filled 2022-09-17: qty 10

## 2022-09-17 MED ORDER — METOPROLOL TARTRATE 50 MG PO TABS
75.0000 mg | ORAL_TABLET | Freq: Two times a day (BID) | ORAL | Status: DC
  Administered 2022-09-17 – 2022-09-20 (×7): 75 mg via ORAL
  Filled 2022-09-17: qty 3
  Filled 2022-09-17 (×6): qty 1

## 2022-09-17 NOTE — Assessment & Plan Note (Addendum)
Stable. cont clonidine 0.1 mg bid, diovan 320 mg, lopressor 75 mg bid

## 2022-09-17 NOTE — Progress Notes (Signed)
PROGRESS NOTE    Edward [name]  AJO:[id number] DOB: [birth year] DOA: 09/17/2022 PCP: Mayra Neer, MD   Brief Narrative: 88 year old, history of permanent A-fib on Eliquis, hypertension, CKD stage IIIa baseline creatinine 1.2--1.4, history of hyponatremia, presented the ER with persistent nausea vomiting that started around 4 PM yesterday.  He vomited multiple times, initially food contents subsequently clear liquid.  He does not last bowel movement. Evaluation in the ED consistent with bilateral lower lobe pneumonia, mild leukocytosis.  Elevated BNP.  Admitted for further treatments. Report cough.    Assessment & Plan:   Principal Problem:   Bilateral pneumonia Active Problems:   Rapid atrial fibrillation (HCC)   Essential hypertension, benign   Stage 3a chronic kidney disease (CKD) (HCC) - baseline SCr 1.2-1.4  1-Bilateral pneumonia: Presented with nausea vomiting, cough, chest x-ray showed bibasilar pulmonary infiltrates. Continue with IV ceftriaxone, change azithromycin to doxycycline due to allergies. Incentive spirometry.   2-Nausea vomiting;  Could be related to gastroenteritis, food poisoning, or in the setting of PNA.   KUB; nonobstructive bowel gas pattern. Moderate stool burden.  Start Bowel regimen.  Clear liquid diet.   3-A fib RVR Was in rapid A-fib with EMS.  Received IV Cardizem.  Resume Lopressor will 75 twice daily.  Echo ordered. Mild elevation troponin in setting PNA, A fib.   Essential hypertension: Continue with Lopressor, hold clonidine to avoid hypotension in the setting of infection.  Stage IIIa CKD creatinine baseline 1.2--1.4. Stable, Will monitor  Mild Hyponatremia; hold Diovan  Estimated body mass index is 25.22 kg/m as calculated from the following:   Height as of 06/27/22: '5\' 9"'$  (1.753 m).   Weight as of 06/27/22: 77.5 kg.   DVT prophylaxis: Eliquis Code Status: Full code Family Communication: care discussed with family   Disposition Plan:  Status is: Observation The patient remains OBS appropriate and will d/c before 2 midnights.    Consultants:  none  Procedures:  ECHO  Antimicrobials:  Ceftriaxone, doxy   Subjective: He report sporadic cough. He develops vomiting yesterday. Denies abdominal pain. Doesn't remember when he had last B<    Objective: Vitals:   09/17/22 0445 09/17/22 0515 09/17/22 0530 09/17/22 0658  BP: 124/62 133/70 (!) 117/59   Pulse: 96 (!) 106 91 96  Resp: (!) 27 (!) 27 (!) 26 (!) 26  Temp:   99.1 F (37.3 C)   TempSrc:   Oral   SpO2: 100% 100% 100% 100%    Intake/Output Summary (Last 24 hours) at 09/17/2022 0803 Last data filed at 09/17/2022 0448 Gross per 24 hour  Intake 600 ml  Output --  Net 600 ml   There were no vitals filed for this visit.  Examination:  General exam: Appears calm and comfortable  Respiratory system: No crackles, no wheezing Cardiovascular system: S1 & S2 heard, RRR. No JVD, murmurs, rubs, gallops or clicks. No pedal edema. Gastrointestinal system: Abdomen is nondistended, soft and nontender. No organomegaly or masses felt. Normal bowel sounds heard. Central nervous system: Alert and oriented.  Extremities: Symmetric 5 x 5 power.    Data Reviewed: I have personally reviewed following labs and imaging studies  CBC: Recent Labs  Lab 09/17/22 0227  WBC 11.8*  NEUTROABS 10.9*  HGB 10.8*  HCT 32.5*  MCV 92.6  PLT 947*   Basic Metabolic Panel: Recent Labs  Lab 09/17/22 0227  NA 132*  K 4.4  CL 95*  CO2 24  GLUCOSE 127*  BUN 32*  CREATININE 1.44*  CALCIUM  8.6*   GFR: CrCl cannot be calculated (Unknown ideal weight.). Liver Function Tests: Recent Labs  Lab 09/17/22 0227  AST 38  ALT 37  ALKPHOS 109  BILITOT 0.8  PROT 6.6  ALBUMIN 3.0*   Recent Labs  Lab 09/17/22 0410  LIPASE 27   No results for input(s): "AMMONIA" in the last 168 hours. Coagulation Profile: Recent Labs  Lab 09/17/22 0227  INR 1.6*    Cardiac Enzymes: No results for input(s): "CKTOTAL", "CKMB", "CKMBINDEX", "TROPONINI" in the last 168 hours. BNP (last 3 results) No results for input(s): "PROBNP" in the last 8760 hours. HbA1C: No results for input(s): "HGBA1C" in the last 72 hours. CBG: No results for input(s): "GLUCAP" in the last 168 hours. Lipid Profile: No results for input(s): "CHOL", "HDL", "LDLCALC", "TRIG", "CHOLHDL", "LDLDIRECT" in the last 72 hours. Thyroid Function Tests: No results for input(s): "TSH", "T4TOTAL", "FREET4", "T3FREE", "THYROIDAB" in the last 72 hours. Anemia Panel: No results for input(s): "VITAMINB12", "FOLATE", "FERRITIN", "TIBC", "IRON", "RETICCTPCT" in the last 72 hours. Sepsis Labs: No results for input(s): "PROCALCITON", "LATICACIDVEN" in the last 168 hours.  Recent Results (from the past 240 hour(s))  SARS Coronavirus 2 by RT PCR (hospital order, performed in [facility] hospital lab) *cepheid single result test* Anterior Nasal Swab     Status: None   Collection Time: 09/17/22  3:44 AM   Specimen: Anterior Nasal Swab  Result Value Ref Range Status   SARS Coronavirus 2 by RT PCR NEGATIVE NEGATIVE Final    Comment: (NOTE) SARS-CoV-2 target nucleic acids are NOT DETECTED.  The SARS-CoV-2 RNA is generally detectable in upper and lower respiratory specimens during the acute phase of infection. The lowest concentration of SARS-CoV-2 viral copies this assay can detect is 250 copies / mL. A negative result does not preclude SARS-CoV-2 infection and should not be used as the sole basis for treatment or other patient management decisions.  A negative result may occur with improper specimen collection / handling, submission of specimen other than nasopharyngeal swab, presence of viral mutation(s) within the areas targeted by this assay, and inadequate number of viral copies (<250 copies / mL). A negative result must be combined with clinical observations, patient history, and  epidemiological information.  Fact Sheet for Patients:   https://www.patel.info/  Fact Sheet for Healthcare Providers: https://hall.com/  This test is not yet approved or  cleared by the [place] FDA and has been authorized for detection and/or diagnosis of SARS-CoV-2 by FDA under an Emergency Use Authorization (EUA).  This EUA will remain in effect (meaning this test can be used) for the duration of the COVID-19 declaration under Section 564(b)(1) of the Act, 21 U.S.C. section 360bbb-3(b)(1), unless the authorization is terminated or revoked sooner.  Performed at [facility], [place] [address] [place], [place] [address]          Radiology Studies: DG Chest Portable 1 View  Result Date: 09/17/2022 CLINICAL DATA:  Dyspnea EXAM: PORTABLE CHEST 1 VIEW COMPARISON:  10/12/2021 FINDINGS: Bibasilar pulmonary infiltrates are present, likely infectious or inflammatory in the acute setting. Pulmonary insufflation is normal and symmetric. No pneumothorax or pleural effusion. Cardiac size is within normal limits. Pulmonary vascularity is normal. No acute bone abnormality. Vascular calcifications are seen within the right upper extremity arterial vasculature IMPRESSION: 1. Bibasilar pulmonary infiltrates, likely infectious or inflammatory. Electronically Signed   By: Fidela Salisbury M.D.   On: 09/17/2022 02:49        Scheduled Meds: Continuous Infusions:  LOS: 0 days    Time spent: 35 minutes    Jolina Symonds A Natan Hartog, MD [facility]   If 7PM-7AM, please contact night-coverage [email or web address]  09/17/2022, 8:03 AM

## 2022-09-17 NOTE — ED Provider Notes (Signed)
[facility] EMERGENCY DEPARTMENT Provider Note   CSN: [id number] Arrival date & time: 09/17/22  3559     History  Chief Complaint  Patient presents with   Shortness of Breath   Irregular Heart Beat    Edward [name] is a 88 y.o. male who presents via EMS with concern for SOB since 4 pm. Patient statues he was out celebrating his 89th birthday at [employer], where he at [employer] and icecream. Subsequently began feeling nauseated, vomiting with NBNB emesis x4 with associated shortness of breath prompting EMS call.  Per EMS patient was in A-fib with RVR with rate up to the 150s at time of their arrival.  He was administered 10 mg of Cardizem in route with resolution of RVR the patient remains in A-fib.  Patient states he is chronically on metoprolol and Eliquis for his A-fib, however states that he was vomiting this evening and is unclear if his medications stay down. At this time patient states he is feeling back to normal, no longer having any shortness of breath and his nausea is significantly improved.   Per chart review patient does have history of diastolic dysfunction on echocardiogram last year.  I personally reviewed his medical records.  He has history of CAD, hyperlipidemia, hypertension, renal artery stenosis, subclavian artery stenosis and permanent atrial fibrillation as above.  Patient states he has had multiple stents in his heart and his renal arteries.  HPI     Home Medications Prior to Admission medications   Medication Sig Start Date End Date Taking? Authorizing Provider  apixaban (ELIQUIS) 5 MG TABS tablet Take 1 tablet (5 mg total) by mouth 2 (two) times daily. 06/27/22  Yes Lendon Colonel, NP  aspirin EC 81 MG tablet Take 1 tablet (81 mg total) by mouth daily. 06/01/18  Yes Lorretta Harp, MD  atorvastatin (LIPITOR) 40 MG tablet Take 1 tablet (40 mg total) by mouth daily. 06/27/22  Yes Lendon Colonel, NP  Calcium Citrate-Vitamin D  (CITRACAL PETITES/VITAMIN D PO) Take 1 tablet by mouth every evening.   Yes [provider]  cloNIDine (CATAPRES) 0.1 MG tablet Take 2 tablets (0.2 mg total) by mouth daily. 08/15/22  Yes Lorretta Harp, MD  Coenzyme Q10 (CO Q-10) 100 MG CAPS Take 100 mg by mouth daily.   Yes [provider]  finasteride (PROSCAR) 5 MG tablet Take 5 mg by mouth daily. 12/16/16  Yes [provider]  folic acid (FOLVITE) 741 MCG tablet Take 400 mcg by mouth daily.   Yes [provider]  LORazepam (ATIVAN) 1 MG tablet Take 1 mg by mouth in the morning and at bedtime. 08/05/13  Yes [provider]  metoprolol tartrate (LOPRESSOR) 50 MG tablet Take 1.5 tablets (75 mg total) by mouth 2 (two) times daily. 06/27/22 02/22/23 Yes Lendon Colonel, NP  Multiple Vitamins-Minerals (PRESERVISION AREDS 2) CAPS Take by mouth.   Yes [provider]  Omega-3 Fatty Acids (FISH OIL) 1000 MG CAPS Take 1,200 mg by mouth in the morning and at bedtime.   Yes [provider]  omeprazole (PRILOSEC) 20 MG capsule Take 20 mg by mouth daily.   Yes [provider]  Polyethyl Glycol-Propyl Glycol 0.4-0.3 % SOLN Apply 1 drop to eye at bedtime as needed (dryness).   Yes [provider]  tamsulosin (FLOMAX) 0.4 MG CAPS capsule Take 0.4 mg by mouth daily. 07/13/13  Yes [provider]  valsartan (DIOVAN) 320 MG tablet  TAKE 1/2 TABLET BY MOUTH 2 TIMES DAILY. Patient taking differently: Take 160 mg by mouth in the morning and at bedtime. 06/27/22  Yes Lendon Colonel, NP      Allergies    Amlodipine, Erythromycin, Penicillins, and Prednisone    Review of Systems   Review of Systems  Constitutional: Negative.   Respiratory:  Positive for shortness of breath.   Cardiovascular:  Negative for chest pain, palpitations and leg swelling.  Gastrointestinal:  Positive for nausea and vomiting. Negative for abdominal pain and diarrhea.  Genitourinary: Negative.      Physical Exam Updated Vital Signs BP (!) 117/59   Pulse 91   Temp 99.1 F (37.3 C) (Oral)   Resp (!) 26   SpO2 100%  Physical Exam Vitals and nursing note reviewed.  Constitutional:      Appearance: He is not ill-appearing or toxic-appearing.  HENT:     Head: Normocephalic and atraumatic.     Nose: Nose normal.     Mouth/Throat:     Mouth: Mucous membranes are moist.     Pharynx: Uvula midline. No oropharyngeal exudate or posterior oropharyngeal erythema.  Eyes:     General: Lids are normal. Vision grossly intact.        Right eye: No discharge.        Left eye: No discharge.     Extraocular Movements: Extraocular movements intact.     Conjunctiva/sclera: Conjunctivae normal.     Pupils: Pupils are equal, round, and reactive to light.  Neck:     Trachea: Trachea and phonation normal.  Cardiovascular:     Rate and Rhythm: Normal rate. Rhythm irregular.     Pulses: Normal pulses.     Heart sounds: Normal heart sounds.  Pulmonary:     Effort: Pulmonary effort is normal. Tachypnea present. No accessory muscle usage or respiratory distress.     Breath sounds: Normal breath sounds. No wheezing or rales.  Chest:     Chest wall: No mass, lacerations, deformity, swelling, tenderness or edema.  Abdominal:     General: Bowel sounds are normal. There is no distension.     Palpations: Abdomen is soft.     Tenderness: There is no abdominal tenderness. There is no right CVA tenderness, left CVA tenderness, guarding or rebound.  Musculoskeletal:        General: No deformity.     Cervical back: Normal range of motion and neck supple.     Right lower leg: No tenderness. No edema.     Left lower leg: No tenderness. No edema.  Lymphadenopathy:     Cervical: No cervical adenopathy.  Skin:    General: Skin is warm and dry.     Capillary Refill: Capillary refill takes less than 2 seconds.  Neurological:     General: No focal deficit present.     Mental Status: He is alert and  oriented to person, place, and time. Mental status is at baseline.  Psychiatric:        Mood and Affect: Mood normal.     ED Results / Procedures / Treatments   Labs (all labs ordered are listed, but only abnormal results are displayed) Labs Reviewed  CBC WITH DIFFERENTIAL/PLATELET - Abnormal; Notable for the following components:      Result Value   WBC 11.8 (*)    RBC 3.51 (*)    Hemoglobin 10.8 (*)    HCT 32.5 (*)    Platelets 146 (*)    Neutro Abs  10.9 (*)    Lymphs Abs 0.2 (*)    All other components within normal limits  BRAIN NATRIURETIC PEPTIDE - Abnormal; Notable for the following components:   B Natriuretic Peptide 712.6 (*)    All other components within normal limits  COMPREHENSIVE METABOLIC PANEL - Abnormal; Notable for the following components:   Sodium 132 (*)    Chloride 95 (*)    Glucose, Bld 127 (*)    BUN 32 (*)    Creatinine, Ser 1.44 (*)    Calcium 8.6 (*)    Albumin 3.0 (*)    GFR, Estimated 46 (*)    All other components within normal limits  PROTIME-INR - Abnormal; Notable for the following components:   Prothrombin Time 19.0 (*)    INR 1.6 (*)    All other components within normal limits  URINALYSIS, ROUTINE W REFLEX MICROSCOPIC - Abnormal; Notable for the following components:   Protein, ur 100 (*)    All other components within normal limits  TROPONIN I (HIGH SENSITIVITY) - Abnormal; Notable for the following components:   Troponin I (High Sensitivity) 25 (*)    All other components within normal limits  SARS CORONAVIRUS 2 BY RT PCR  LIPASE, BLOOD  TROPONIN I (HIGH SENSITIVITY)    EKG EKG Interpretation  Date/Time:  Wednesday September 17 2022 02:28:50 EST Ventricular Rate:  87 PR Interval:    QRS Duration: 127 QT Interval:  357 QTC Calculation: 430 R Axis:   54 Text Interpretation: Atrial fibrillation Right bundle branch block Confirmed by Addison Lank [id number]) on 09/17/2022 5:30:36 AM  Radiology DG Chest Portable 1  View  Result Date: 09/17/2022 CLINICAL DATA:  Dyspnea EXAM: PORTABLE CHEST 1 VIEW COMPARISON:  10/12/2021 FINDINGS: Bibasilar pulmonary infiltrates are present, likely infectious or inflammatory in the acute setting. Pulmonary insufflation is normal and symmetric. No pneumothorax or pleural effusion. Cardiac size is within normal limits. Pulmonary vascularity is normal. No acute bone abnormality. Vascular calcifications are seen within the right upper extremity arterial vasculature IMPRESSION: 1. Bibasilar pulmonary infiltrates, likely infectious or inflammatory. Electronically Signed   By: Fidela Salisbury M.D.   On: 09/17/2022 02:49    Procedures Procedures    Medications Ordered in ED Medications  ondansetron (ZOFRAN) injection 4 mg (4 mg Intravenous Given 09/17/22 0251)  cefTRIAXone (ROCEPHIN) 1 g in sodium chloride 0.9 % 100 mL IVPB (0 g Intravenous Stopped 09/17/22 0431)  doxycycline (VIBRA-TABS) tablet 100 mg (100 mg Oral Given 09/17/22 0346)  lactated ringers bolus 500 mL (0 mLs Intravenous Stopped 09/17/22 0448)  furosemide (LASIX) injection 20 mg (20 mg Intravenous Given 09/17/22 0538)    ED Course/ Medical Decision Making/ A&P Clinical Course as of 09/17/22 9417  Wed Sep 17, 2022  0309 Bilirubin Urine: NEGATIVE [RS]  4081 Case discussed with ED pharmacist, Charleston Ropes, who states patient has tolerated rocephin before, but no documentation of tolerance of macrolides in the past. Will proceed with rocephin and doxycyline at this time for tx of CAP. [RS]    Clinical Course User Index [RS] Emeline Darling, PA-C                           Medical Decision Making 88 year old male presents with concern for shortness of breath, nausea and vomiting.  Vital signs are very reassuring and intake.  Heart rate is in the 80s at this time.  Patient remains in atrial fibrillation with reassuring pulmonary exam.  Abdominal exam is  benign.  Patient without lower extremity edema on exam.  DDx  includes was limited to ACS, PE, pleural effusion, dysrhythmia, metabolic derangement  Amount and/or Complexity of Data Reviewed Labs: ordered. Decision-making details documented in ED Course.    Details: CBC with leukocytosis of 11.8, mild anemia with hemoglobin of 10.8 near patient's baseline.  CMP with hyponatremia of 132, mild elevation in creatinine to 1.4 increasing patient's baseline of 1.2.  UA unremarkable, INR elevated to 1.6 expected on anticoagulation with Eliquis for atrial fibrillation.  Lipase is normal, initial troponin negative, 17 however delta troponin positive at 25.  BNP elevated to 712.  Radiology: ordered.  Risk Prescription drug management. Decision regarding hospitalization.    Patient ambulated in the ED by ED RN and had maintained normal oxygen saturation, however became exquisitely short of breath with ambulation and increasingly tachypneic.  Given ongoing clinical picture of concomitant CAP and CHF exacerbation feel patient will benefit from mission to the hospital at this time.  Lasix and antibiotic coverage initiated.  Hospitalist Dr. Bridgett Larsson consulted and is agreeable to admitting this patient to his service.  Michelangelo and his family voiced understanding of his medical evaluation and treatment plan. Each of their questions answered to their expressed satisfaction.   They are amenable to plan for admission at this time.  This chart was dictated using voice recognition software, Dragon. Despite the best efforts of this provider to proofread and correct errors, errors may still occur which can change documentation meaning.   Final Clinical Impression(s) / ED Diagnoses Final diagnoses:  Acute diastolic congestive heart failure ([place])  Community acquired pneumonia, unspecified laterality    Rx / DC Orders ED Discharge Orders     None         Aura Dials 09/17/22 2229    Fatima Blank, MD 09/17/22 [phone]

## 2022-09-17 NOTE — ED Notes (Signed)
Physical therapy at bedside ambulating patient

## 2022-09-17 NOTE — ED Notes (Signed)
MD asked to release Azythromycin order so she can dc it due to pts allergy to erythromycin

## 2022-09-17 NOTE — Subjective & Objective (Signed)
CC: vomiting HPI: 88 year old white male (turned 88 years old yesterday) history of permanent A-fib on Eliquis, hypertension, CKD stage IIIa baseline creatinine 1.2-1.4, history of hyponatremia, hyperlipidemia presents to the ER today with persistent vomiting and nausea that started around 4:00 PM yesterday.  Discontinue around 8:00 in the evening.  He vomited several times.  He has not had any diarrhea.  He has had rigors.  No fevers that he knows of.  EMS was called.  Was noted to be in rapid A-fib.  Given 10 mg of IV Cardizem in route.  On arrival to ER, temp 99.1 heart rate 83 blood pressure 148/88 satting 100% on room air.  Labs showed a BNP that was elevated at 712.  No baseline.  Sodium 132, potassium 4.4, BUN of 32, creatinine 1.4  White count 11.8, hemoglobin 10.8, platelets 146  Chest x-ray demonstrated bilateral lower lobe pneumonia  EKG showed right bundle branch block and A-fib.  [facility] hospitalist contacted for admission.

## 2022-09-17 NOTE — Assessment & Plan Note (Signed)
Observation cardiac telemetry bed. IV rocephin, po zithromax. Family has pt's 89th birthday party planned for this weekend. They would like to get him home for the party.

## 2022-09-17 NOTE — Assessment & Plan Note (Signed)
Stable

## 2022-09-17 NOTE — ED Notes (Signed)
Patient ambulated in room with pulse ox on. Oxygen levels maintained above 97%. When patient returned back into bed audible wheezing and increase SOB. Patient also reports "I feel out of breathe".

## 2022-09-17 NOTE — Assessment & Plan Note (Addendum)
Hx of permanent afib. On Eliquis. Was in rapid afib with EMS. Received IV cardizem. Taking lopressor 75 mg bid. Check echo. BNP elevated to 700 but pt does not have any baseline BNP. No pulmonary edema on CXR. Mild LE edema that is chronic. Will check echo. Hold on further diuresis for now.

## 2022-09-17 NOTE — Evaluation (Signed)
Physical Therapy Evaluation Patient Details Name: Edward [name] MRN: [id number] DOB: 1929/11/23 Today's Date: 09/17/2022  History of Present Illness  88 y.o. male presents to [facility] hospital on 09/17/2022 with vomiting, nausea, and rigors. Pt noted to be in afib, chest x-ray demonstrates bilateral PNA. PMH includes afib, HTN, CKD, HLD.  Clinical Impression  Pt presents to PT with deficits in strength, endurance and cardiopulmonary function, although not far from his baseline per pt report. Pt is able to ambulate for household distances without the use of an assistive device. Pt reports mild SOB, not atypical for him for similar amounts of physical activity. PT encourages frequent mobilization in an effort to improve cardiopulmonary function. PT anticipates no post-acute PT needs.     Recommendations for follow up therapy are one component of a multi-disciplinary discharge planning process, led by the attending physician.  Recommendations may be updated based on patient status, additional functional criteria and insurance authorization.  Follow Up Recommendations No PT follow up      Assistance Recommended at Discharge PRN (continued assist with cooking and cleaning)  Patient can return home with the following  Assistance with cooking/housework    Equipment Recommendations None recommended by PT  Recommendations for Other Services       Functional Status Assessment Patient has had a recent decline in their functional status and demonstrates the ability to make significant improvements in function in a reasonable and predictable amount of time.     Precautions / Restrictions Precautions Precautions: Other (comment) Precaution Comments: monitor SpO2 and HR Restrictions Weight Bearing Restrictions: No      Mobility  Bed Mobility Overal bed mobility: Modified Independent                  Transfers Overall transfer level: Independent Equipment used: None                     Ambulation/Gait Ambulation/Gait assistance: Modified independent (Device/Increase time) Gait Distance (Feet): 200 Feet Assistive device: None Gait Pattern/deviations: Step-through pattern Gait velocity: reduced Gait velocity interpretation: <1.8 ft/sec, indicate of risk for recurrent falls   General Gait Details: slowed step-through gait  Stairs            Wheelchair Mobility    Modified Rankin (Stroke Patients Only)       Balance Overall balance assessment: Mild deficits observed, not formally tested                                           Pertinent Vitals/Pain Pain Assessment Pain Assessment: No/denies pain    Home Living Family/patient expects to be discharged to:: Private residence Living Arrangements: Spouse/significant other Available Help at Discharge: Family;Available PRN/intermittently;Personal care attendant (aide 2 hours a week to assist with bathing spouse, cook prepares all meals, cleaning services hired) Type of Home: House Home Access: Stairs to enter Entrance Stairs-Rails: Right Entrance Stairs-Number of Steps: 1+1   Home Layout: One level Home Equipment: Kasandra Knudsen - single point      Prior Function Prior Level of Function : Independent/Modified Independent                     Hand Dominance        Extremity/Trunk Assessment   Upper Extremity Assessment Upper Extremity Assessment: Overall WFL for tasks assessed    Lower Extremity Assessment Lower Extremity Assessment: Generalized weakness  Cervical / Trunk Assessment Cervical / Trunk Assessment: Kyphotic  Communication   Communication: HOH (L ear impaired)  Cognition Arousal/Alertness: Awake/alert Behavior During Therapy: WFL for tasks assessed/performed Overall Cognitive Status: Within Functional Limits for tasks assessed                                          General Comments General comments (skin integrity, edema, etc.):  VSS on RA, SpO2 observed at 91% at lowest    Exercises     Assessment/Plan    PT Assessment Patient needs continued PT services  PT Problem List Decreased strength;Decreased activity tolerance;Decreased balance;Decreased mobility;Cardiopulmonary status limiting activity       PT Treatment Interventions DME instruction;Gait training;Stair training;Functional mobility training;Therapeutic exercise;Balance training;Patient/family education    PT Goals (Current goals can be found in the Care Plan section)  Acute Rehab PT Goals Patient Stated Goal: to return home for 89th birthday party PT Goal Formulation: With patient Time For Goal Achievement: 10/01/22 Potential to Achieve Goals: Good Additional Goals Additional Goal #1: Pt will ambulate for >1000', reporting a DOE rating of 1/4 or less, in an effort to demonstrate improved activity tolerance    Frequency Min 2X/week     Co-evaluation               AM-PAC PT "6 Clicks" Mobility  Outcome Measure Help needed turning from your back to your side while in a flat bed without using bedrails?: None Help needed moving from lying on your back to sitting on the side of a flat bed without using bedrails?: None Help needed moving to and from a bed to a chair (including a wheelchair)?: None Help needed standing up from a chair using your arms (e.g., wheelchair or bedside chair)?: None Help needed to walk in hospital room?: None Help needed climbing 3-5 steps with a railing? : None 6 Click Score: 24    End of Session   Activity Tolerance: Patient tolerated treatment well Patient left: in bed Nurse Communication: Mobility status PT Visit Diagnosis: Other abnormalities of gait and mobility (R26.89);Muscle weakness (generalized) (M62.81)    Time: 0076-2263 PT Time Calculation (min) (ACUTE ONLY): 20 min   Charges:   PT Evaluation $PT Eval Low Complexity: [place], PT, DPT Acute Rehabilitation Office  [phone]   Zenaida Niece 09/17/2022, 3:19 PM

## 2022-09-17 NOTE — ED Triage Notes (Signed)
BIB EMS from home for SOB since 4p and jittery/shakiness. 88%RA. EMS noted patient to Afib RVR 150s. HX afib, received 10 Cardizem en route HR went to 90-70s

## 2022-09-17 NOTE — ED Notes (Signed)
ED Provider at bedside. 

## 2022-09-17 NOTE — ED Notes (Signed)
Pt able to ambulate to the bathroom with minimal assistance. Sheet on stretcher changed per pt's family's request. Pt successful in urinating and having a bowel movement. Pt is back in the bed, given warm blankets and a pillow and is resting comfortably. Family at bedside.

## 2022-09-17 NOTE — H&P (Signed)
History and Physical    Edward [name] WUJ:[id number] DOB: 01-22-1930 DOA: 09/17/2022  DOS: the patient was seen and examined on 09/17/2022  PCP: Mayra Neer, MD   Patient coming from: Home  I have personally briefly reviewed patient's old medical records in [facility]  CC: vomiting HPI: 88 year old white male (turned 88 years old yesterday) history of permanent A-fib on Eliquis, hypertension, CKD stage IIIa baseline creatinine 1.2-1.4, history of hyponatremia, hyperlipidemia presents to the ER today with persistent vomiting and nausea that started around 4:00 PM yesterday.  Discontinue around 8:00 in the evening.  He vomited several times.  He has not had any diarrhea.  He has had rigors.  No fevers that he knows of.  EMS was called.  Was noted to be in rapid A-fib.  Given 10 mg of IV Cardizem in route.  On arrival to ER, temp 99.1 heart rate 83 blood pressure 148/88 satting 100% on room air.  Labs showed a BNP that was elevated at 712.  No baseline.  Sodium 132, potassium 4.4, BUN of 32, creatinine 1.4  White count 11.8, hemoglobin 10.8, platelets 146  Chest x-ray demonstrated bilateral lower lobe pneumonia  EKG showed right bundle branch block and A-fib.  [facility] hospital[facility] contacted for admission.   ED Course: CXR shows bilateral pna  Review of Systems:  Review of Systems  Constitutional:  Negative for fever.       +rigors  HENT: Negative.    Eyes: Negative.   Respiratory:  Positive for cough.   Cardiovascular: Negative.   Gastrointestinal:  Positive for nausea and vomiting.  Genitourinary: Negative.   Musculoskeletal: Negative.   Skin: Negative.   Neurological: Negative.   Endo/Heme/Allergies: Negative.   Psychiatric/Behavioral: Negative.    All other systems reviewed and are negative.   Past Medical History:  Diagnosis Date   Atherosclerosis of abdominal aorta (HCC)    CT/abd and pelvis   Carotid artery occlusion    bilateral carotid bruit,  right greater then left -bilateral 40-50% stenosis, 12/26/09- no change   Chronic anxiety    Coronary artery disease    s/p BM stent, prox and mid RCA, 2001, cutting ballon RCA stenosis, 2002   Dysphagia    from esophageal dysmotility-tx with careful eating.    History of echocardiogram    2/11 echo EF 70%, mild MR, mildly elevated pulmonary pressures 42 mmHg   History of renal angiogram    9/10, showed patent renal stents, 30-40% instent restenosis ws the most severe lesion   Hyperlipidemia    Left kidney mass    lower pole, observing by urology- Dr. Reece Agar   Peripheral neuropathy    in both feet from nerve compression    Renovascular hypertension    s./p. bilateral RA stent implant   Subclavian artery stenosis [facility])     Past Surgical History:  Procedure Laterality Date   CARDIAC CATHETERIZATION  2001   BM stent, prox and mid RCA   cataract surgery Bilateral 04/14   COLONOSCOPY     every 10 years   left foot surgery     RENAL ARTERY STENT  08/07     reports that he has never smoked. He has never used smokeless tobacco. He reports that he does not drink alcohol and does not use drugs.  Allergies  Allergen Reactions   Amlodipine Other (See Comments)    Tolerates 2.5 mg, higher doses cause LEE with blistering   Erythromycin    Penicillins  Prednisone     Family History  Problem Relation Age of Onset   Hypertension Mother    Hypertension Father     Prior to Admission medications   Medication Sig Start Date End Date Taking? Authorizing Provider  apixaban (ELIQUIS) 5 MG TABS tablet Take 1 tablet (5 mg total) by mouth 2 (two) times daily. 06/27/22  Yes Lendon Colonel, NP  aspirin EC 81 MG tablet Take 1 tablet (81 mg total) by mouth daily. 06/01/18  Yes Lorretta Harp, MD  atorvastatin (LIPITOR) 40 MG tablet Take 1 tablet (40 mg total) by mouth daily. 06/27/22  Yes Lendon Colonel, NP  Calcium Carbonate-Vitamin D (CALCIUM PLUS VITAMIN D PO) Take 1 tablet by  mouth daily.     [provider]  cloNIDine (CATAPRES) 0.1 MG tablet Take 2 tablets (0.2 mg total) by mouth daily. 08/15/22   Lorretta Harp, MD  Coenzyme Q10 (CO Q-10) 100 MG CAPS Take 100 mg by mouth daily.    [provider]  finasteride (PROSCAR) 5 MG tablet Take 5 mg by mouth daily. 12/16/16   [provider]  fluticasone (FLONASE) 50 MCG/ACT nasal spray Place 2 sprays into both nostrils daily as needed for allergies.    [provider]  fluticasone (FLOVENT HFA) 44 MCG/ACT inhaler Inhale 2 puffs into the lungs in the morning and at bedtime. 10/01/21   [provider]  folic acid (FOLVITE) 175 MCG tablet Take 400 mcg by mouth daily.    [provider]  LORazepam (ATIVAN) 1 MG tablet Take 1 mg by mouth in the morning and at bedtime. 08/05/13   [provider]  metoprolol tartrate (LOPRESSOR) 50 MG tablet Take 1.5 tablets (75 mg total) by mouth 2 (two) times daily. 06/27/22 02/22/23  Lendon Colonel, NP  Multiple Vitamins-Minerals (PRESERVISION AREDS 2) CAPS Take by mouth.    [provider]  Multiple Vitamins-Minerals (PRESERVISION AREDS 2+MULTI VIT PO) Take 1 tablet by mouth daily.    [provider]  Omega-3 Fatty Acids (FISH OIL) 1000 MG CAPS Take 1,200 mg by mouth daily.    [provider]  omeprazole (PRILOSEC) 20 MG capsule Take 20 mg by mouth daily.    [provider]  Polyethyl Glycol-Propyl Glycol 0.4-0.3 % SOLN Apply 1 drop to eye at bedtime as needed (dryness).    [provider]  tamsulosin (FLOMAX) 0.4 MG CAPS capsule Take 0.4 mg by mouth daily. 07/13/13   [provider]  triamcinolone cream (KENALOG) 0.1 % Apply 1 application topically as needed (irritation). Apply to both legs 08/01/13   [provider]  VALERIAN ROOT PO Take 300 mg by mouth as needed (Take as needed for nerves).    [provider]  valsartan (DIOVAN) 320 MG tablet TAKE 1/2  TABLET BY MOUTH 2 TIMES DAILY. 06/27/22   Lendon Colonel, NP    Physical Exam: Vitals:   09/17/22 0345 09/17/22 0445 09/17/22 0515 09/17/22 0530  BP: 132/63 124/62 133/70 (!) 117/59  Pulse: 89 96 (!) 106 91  Resp: (!) 29 (!) 27 (!) 27 (!) 26  Temp:    99.1 F (37.3 C)  TempSrc:    Oral  SpO2: 100% 100% 100% 100%    Physical Exam Vitals and nursing note reviewed.  Constitutional:      General: He is not in acute distress.    Appearance: He is not ill-appearing, toxic-appearing or diaphoretic.  HENT:     Head: Normocephalic and  atraumatic.     Ears:     Comments: Hard of hearing in left ear Cardiovascular:     Rate and Rhythm: Normal rate. Rhythm irregular.     Pulses: Normal pulses.     Heart sounds: Murmur heard.     Comments: 4-5 cm JVD Pulmonary:     Effort: Pulmonary effort is normal.     Breath sounds: Examination of the right-lower field reveals rales. Examination of the left-lower field reveals rales. Rales present.  Abdominal:     General: Abdomen is flat. Bowel sounds are normal. There is no distension.     Palpations: Abdomen is soft.     Tenderness: There is no abdominal tenderness. There is no guarding.  Musculoskeletal:     Right lower leg: Edema present.     Left lower leg: Edema present.     Comments: Family states pt has chronic LE edema. Unchanged.  Skin:    General: Skin is warm and dry.     Capillary Refill: Capillary refill takes less than 2 seconds.  Neurological:     Mental Status: He is alert and oriented to person, place, and time.     Comments: Hard of hearing in left ear      Labs on Admission: I have personally reviewed following labs and imaging studies  CBC: Recent Labs  Lab 09/17/22 0227  WBC 11.8*  NEUTROABS 10.9*  HGB 10.8*  HCT 32.5*  MCV 92.6  PLT 381*   Basic Metabolic Panel: Recent Labs  Lab 09/17/22 0227  NA 132*  K 4.4  CL 95*  CO2 24  GLUCOSE 127*  BUN 32*  CREATININE 1.44*  CALCIUM 8.6*   GFR: CrCl  cannot be calculated (Unknown ideal weight.). Liver Function Tests: Recent Labs  Lab 09/17/22 0227  AST 38  ALT 37  ALKPHOS 109  BILITOT 0.8  PROT 6.6  ALBUMIN 3.0*   Recent Labs  Lab 09/17/22 0410  LIPASE 27   No results for input(s): "AMMONIA" in the last 168 hours. Coagulation Profile: Recent Labs  Lab 09/17/22 0227  INR 1.6*   Cardiac Enzymes: Recent Labs  Lab 09/17/22 0227 09/17/22 0410  TROPONINIHS 17 25*   BNP (last 3 results) No results for input(s): "PROBNP" in the last 8760 hours. HbA1C: No results for input(s): "HGBA1C" in the last 72 hours. CBG: No results for input(s): "GLUCAP" in the last 168 hours. Lipid Profile: No results for input(s): "CHOL", "HDL", "LDLCALC", "TRIG", "CHOLHDL", "LDLDIRECT" in the last 72 hours. Thyroid Function Tests: No results for input(s): "TSH", "T4TOTAL", "FREET4", "T3FREE", "THYROIDAB" in the last 72 hours. Anemia Panel: No results for input(s): "VITAMINB12", "FOLATE", "FERRITIN", "TIBC", "IRON", "RETICCTPCT" in the last 72 hours. Urine analysis:    Component Value Date/Time   COLORURINE YELLOW 09/17/2022 0227   APPEARANCEUR CLEAR 09/17/2022 0227   LABSPEC 1.017 09/17/2022 0227   PHURINE 5.0 09/17/2022 0227   GLUCOSEU NEGATIVE 09/17/2022 0227   HGBUR NEGATIVE 09/17/2022 0227   BILIRUBINUR NEGATIVE 09/17/2022 0227   [place] 09/17/2022 0227   PROTEINUR 100 (A) 09/17/2022 0227   NITRITE NEGATIVE 09/17/2022 0227   LEUKOCYTESUR NEGATIVE 09/17/2022 0227    Radiological Exams on Admission: I have personally reviewed images DG Chest Portable 1 View  Result Date: 09/17/2022 CLINICAL DATA:  Dyspnea EXAM: PORTABLE CHEST 1 VIEW COMPARISON:  10/12/2021 FINDINGS: Bibasilar pulmonary infiltrates are present, likely infectious or inflammatory in the acute setting. Pulmonary insufflation is normal and symmetric. No pneumothorax or pleural effusion. Cardiac size  is within normal limits. Pulmonary vascularity is normal.  No acute bone abnormality. Vascular calcifications are seen within the right upper extremity arterial vasculature IMPRESSION: 1. Bibasilar pulmonary infiltrates, likely infectious or inflammatory. Electronically Signed   By: Fidela Salisbury M.D.   On: 09/17/2022 02:49    EKG: My personal interpretation of EKG shows: afib, RBBB    Assessment/Plan Principal Problem:   Bilateral pneumonia Active Problems:   Rapid atrial fibrillation (HCC)   Essential hypertension, benign   Stage 3a chronic kidney disease (CKD) (HCC) - baseline SCr 1.2-1.4    Assessment and Plan: * Bilateral pneumonia Observation cardiac telemetry bed. IV rocephin, po zithromax. Family has pt's 89th birthday party planned for this weekend. They would like to get him home for the party.  Rapid atrial fibrillation (HCC) Hx of permanent afib. On Eliquis. Was in rapid afib with EMS. Received IV cardizem. Taking lopressor 75 mg bid. Check echo. BNP elevated to 700 but pt does not have any baseline BNP. No pulmonary edema on CXR. Mild LE edema that is chronic. Will check echo. Hold on further diuresis for now.  Stage 3a chronic kidney disease (CKD) (HCC) - baseline SCr 1.2-1.4 Stable.  Essential hypertension, benign Stable. cont clonidine 0.1 mg bid, diovan 320 mg, lopressor 75 mg bid   DVT prophylaxis: Eliquis Code Status: Full Code Family Communication: discussed with pt, pt's wife patricia and pt's dtr lynn at bedside Disposition Plan: return home  Consults called: none  Admission status: Observation, Telemetry bed   Kristopher Oppenheim, DO [facility] 09/17/2022, 6:05 AM

## 2022-09-18 ENCOUNTER — Observation Stay (HOSPITAL_COMMUNITY): Payer: Medicare Other

## 2022-09-18 DIAGNOSIS — Z8249 Family history of ischemic heart disease and other diseases of the circulatory system: Secondary | ICD-10-CM | POA: Diagnosis not present

## 2022-09-18 DIAGNOSIS — I4891 Unspecified atrial fibrillation: Secondary | ICD-10-CM

## 2022-09-18 DIAGNOSIS — J189 Pneumonia, unspecified organism: Secondary | ICD-10-CM | POA: Diagnosis not present

## 2022-09-18 DIAGNOSIS — K5289 Other specified noninfective gastroenteritis and colitis: Secondary | ICD-10-CM | POA: Diagnosis present

## 2022-09-18 DIAGNOSIS — E785 Hyperlipidemia, unspecified: Secondary | ICD-10-CM | POA: Diagnosis present

## 2022-09-18 DIAGNOSIS — E876 Hypokalemia: Secondary | ICD-10-CM | POA: Diagnosis present

## 2022-09-18 DIAGNOSIS — Z7982 Long term (current) use of aspirin: Secondary | ICD-10-CM | POA: Diagnosis not present

## 2022-09-18 DIAGNOSIS — I361 Nonrheumatic tricuspid (valve) insufficiency: Secondary | ICD-10-CM | POA: Diagnosis present

## 2022-09-18 DIAGNOSIS — I4510 Unspecified right bundle-branch block: Secondary | ICD-10-CM | POA: Diagnosis present

## 2022-09-18 DIAGNOSIS — I5033 Acute on chronic diastolic (congestive) heart failure: Secondary | ICD-10-CM | POA: Diagnosis not present

## 2022-09-18 DIAGNOSIS — E1122 Type 2 diabetes mellitus with diabetic chronic kidney disease: Secondary | ICD-10-CM | POA: Diagnosis present

## 2022-09-18 DIAGNOSIS — Z955 Presence of coronary angioplasty implant and graft: Secondary | ICD-10-CM | POA: Diagnosis not present

## 2022-09-18 DIAGNOSIS — I2583 Coronary atherosclerosis due to lipid rich plaque: Secondary | ICD-10-CM | POA: Diagnosis present

## 2022-09-18 DIAGNOSIS — I4821 Permanent atrial fibrillation: Secondary | ICD-10-CM | POA: Diagnosis not present

## 2022-09-18 DIAGNOSIS — I10 Essential (primary) hypertension: Secondary | ICD-10-CM | POA: Diagnosis not present

## 2022-09-18 DIAGNOSIS — I2510 Atherosclerotic heart disease of native coronary artery without angina pectoris: Secondary | ICD-10-CM | POA: Diagnosis present

## 2022-09-18 DIAGNOSIS — I272 Pulmonary hypertension, unspecified: Secondary | ICD-10-CM | POA: Diagnosis not present

## 2022-09-18 DIAGNOSIS — N1831 Chronic kidney disease, stage 3a: Secondary | ICD-10-CM | POA: Diagnosis present

## 2022-09-18 DIAGNOSIS — E871 Hypo-osmolality and hyponatremia: Secondary | ICD-10-CM | POA: Diagnosis not present

## 2022-09-18 DIAGNOSIS — I5031 Acute diastolic (congestive) heart failure: Secondary | ICD-10-CM | POA: Diagnosis not present

## 2022-09-18 DIAGNOSIS — Z7901 Long term (current) use of anticoagulants: Secondary | ICD-10-CM | POA: Diagnosis not present

## 2022-09-18 DIAGNOSIS — Z20822 Contact with and (suspected) exposure to covid-19: Secondary | ICD-10-CM | POA: Diagnosis present

## 2022-09-18 DIAGNOSIS — Z79899 Other long term (current) drug therapy: Secondary | ICD-10-CM | POA: Diagnosis not present

## 2022-09-18 LAB — COMPREHENSIVE METABOLIC PANEL
ALT: 30 U/L (ref 0–44)
AST: 33 U/L (ref 15–41)
Albumin: 2.5 g/dL — ABNORMAL LOW (ref 3.5–5.0)
Alkaline Phosphatase: 73 U/L (ref 38–126)
Anion gap: 11 (ref 5–15)
BUN: 30 mg/dL — ABNORMAL HIGH (ref 8–23)
CO2: 24 mmol/L (ref 22–32)
Calcium: 7.8 mg/dL — ABNORMAL LOW (ref 8.9–10.3)
Chloride: 92 mmol/L — ABNORMAL LOW (ref 98–111)
Creatinine, Ser: 1.41 mg/dL — ABNORMAL HIGH (ref 0.61–1.24)
GFR, Estimated: 47 mL/min — ABNORMAL LOW (ref >60)
Glucose, Bld: 101 mg/dL — ABNORMAL HIGH (ref 70–99)
Potassium: 3.8 mmol/L (ref 3.5–5.1)
Sodium: 127 mmol/L — ABNORMAL LOW (ref 135–145)
Total Bilirubin: 1.1 mg/dL (ref 0.3–1.2)
Total Protein: 5.6 g/dL — ABNORMAL LOW (ref 6.5–8.1)

## 2022-09-18 LAB — CBC WITH DIFFERENTIAL/PLATELET
Abs Immature Granulocytes: 0.04 10*3/uL (ref 0.00–0.07)
Basophils Absolute: 0 10*3/uL (ref 0.0–0.1)
Basophils Relative: 0 %
Eosinophils Absolute: 0 10*3/uL (ref 0.0–0.5)
Eosinophils Relative: 0 %
HCT: 27.9 % — ABNORMAL LOW (ref 39.0–52.0)
Hemoglobin: 9.5 g/dL — ABNORMAL LOW (ref 13.0–17.0)
Immature Granulocytes: 0 %
Lymphocytes Relative: 8 %
Lymphs Abs: 0.7 10*3/uL (ref 0.7–4.0)
MCH: 31.3 pg (ref 26.0–34.0)
MCHC: 34.1 g/dL (ref 30.0–36.0)
MCV: 91.8 fL (ref 80.0–100.0)
Monocytes Absolute: 0.8 10*3/uL (ref 0.1–1.0)
Monocytes Relative: 9 %
Neutro Abs: 7.9 10*3/uL — ABNORMAL HIGH (ref 1.7–7.7)
Neutrophils Relative %: 83 %
Platelets: 116 10*3/uL — ABNORMAL LOW (ref 150–400)
RBC: 3.04 MIL/uL — ABNORMAL LOW (ref 4.22–5.81)
RDW: 13.9 % (ref 11.5–15.5)
WBC: 9.5 10*3/uL (ref 4.0–10.5)
nRBC: 0 % (ref 0.0–0.2)

## 2022-09-18 LAB — ECHOCARDIOGRAM COMPLETE
AR max vel: 2.41 cm2
AV Area VTI: 2.81 cm2
AV Area mean vel: 2.43 cm2
AV Mean grad: 3 mmHg
AV Peak grad: 4.4 mmHg
Ao pk vel: 1.05 m/s
Height: 67 in
MV VTI: 2.27 cm2
S' Lateral: 3.3 cm
Weight: 2698.43 oz

## 2022-09-18 LAB — LEGIONELLA PNEUMOPHILA SEROGP 1 UR AG: L. pneumophila Serogp 1 Ur Ag: NEGATIVE

## 2022-09-18 LAB — MAGNESIUM: Magnesium: 1.4 mg/dL — ABNORMAL LOW (ref 1.7–2.4)

## 2022-09-18 LAB — BRAIN NATRIURETIC PEPTIDE: B Natriuretic Peptide: 468.8 pg/mL — ABNORMAL HIGH (ref 0.0–100.0)

## 2022-09-18 MED ORDER — CLONIDINE HCL 0.2 MG PO TABS
0.2000 mg | ORAL_TABLET | Freq: Every day | ORAL | Status: DC
  Administered 2022-09-18 – 2022-09-21 (×4): 0.2 mg via ORAL
  Filled 2022-09-18 (×4): qty 1

## 2022-09-18 MED ORDER — DOXYCYCLINE HYCLATE 100 MG PO TABS
100.0000 mg | ORAL_TABLET | Freq: Two times a day (BID) | ORAL | Status: DC
  Administered 2022-09-18 – 2022-09-21 (×7): 100 mg via ORAL
  Filled 2022-09-18 (×7): qty 1

## 2022-09-18 MED ORDER — MAGNESIUM SULFATE 2 GM/50ML IV SOLN
2.0000 g | Freq: Once | INTRAVENOUS | Status: AC
  Administered 2022-09-18: 2 g via INTRAVENOUS
  Filled 2022-09-18: qty 50

## 2022-09-18 MED ORDER — MAGNESIUM OXIDE -MG SUPPLEMENT 400 (240 MG) MG PO TABS
200.0000 mg | ORAL_TABLET | Freq: Two times a day (BID) | ORAL | Status: DC
  Administered 2022-09-18 – 2022-09-21 (×7): 200 mg via ORAL
  Filled 2022-09-18 (×7): qty 1

## 2022-09-18 MED ORDER — HYDRALAZINE HCL 25 MG PO TABS
25.0000 mg | ORAL_TABLET | Freq: Three times a day (TID) | ORAL | Status: AC
  Administered 2022-09-18 (×2): 25 mg via ORAL
  Filled 2022-09-18 (×2): qty 1

## 2022-09-18 NOTE — Consult Note (Signed)
Cardiology Consultation   Edward [name] [name] ID: Edward [name] Edward [name] [name] MRN: [id number]; DOB: 08-23-1930  Admit date: 09/17/2022 Date of Consult: 09/18/2022  PCP:  Edward [name] Edward [name] [name], Edward [name] Edward [name] [name] Cardiologist:  Edward Burow, MD  Electrophysiologist:  Edward [name] Haw, MD       Edward [name] [name] Profile:   Edward [name] Edward [name] [name] is a 88 y.o. male with a hx of permanent atrial fibrillation, carotid artery disease with bilateral subclavian disease, hypertension, hyperlipidemia, pulmonary hypertension and a history of grade 2 diastolic dysfunction who is being seen 09/18/2022 for Edward [name] evaluation of possible CHF at Edward [name] request of Dr. Tyrell [name].  History of Present Illness:   Edward [name] Edward [name] [name] is a pleasant 88 year old male with past medical history of permanent atrial fibrillation, carotid artery disease with bilateral subclavian disease, hypertension, hyperlipidemia, pulmonary hypertension and a history of grade 2 diastolic dysfunction.  He had a bare-metal stent to RCA in 2001 and Cutting Balloon angioplasty of RCA in 2002.  He required bilateral renal artery stenting by Dr. Riley [name] on 06/18/2006.  He also has a history of SVT and permanent afib followed by Dr. Curt [name] and EP service.  Heart monitor in May 2022 showed minimal heart rate 40 bpm, maximal heart rate 190 bpm, average heart rate 51 bpm, PVC burden 7.8%, 380 episode of SVT was longest duration 12 minutes and 13 seconds.  Edward [name] [name] was admitted in December 2022 with nausea, vomiting, fever, +flu Edward [name] week prior, profound hyponatremia and atrial fibrillation.  He was started on Eliquis and rate control with Lopressor.  Carotid Doppler obtained in January 2023 showed moderate bilateral ICA stenosis, left >right and retrograde right vertebral filling suggestive of subclavian steal.  This was treated conservatively.  He was followed up in January 2023 to discuss possible cardioversion as outpatient, after much thought and discussion, they decided to  decline further cardioversion and stay in A-fib.  Edward [name] [name] was most recently seen by Edward [name] Domino, NP on 06/27/2022 at which time he was doing well.  For Edward [name] past several months, he has been noticing fatigue and shortness of breath.  He denies any chest discomfort.  He celebrated his 89th birthday on 09/16/2020.  Afterward, he has significant nausea and vomiting.  He vomited a total of 4 different times on Tuesday.  This prompted Edward [name] Edward [name] [name] to seek urgent medical attention at Edward [name] Edward [name] [name], ED shortly after midnight on 09/17/2022.  On arrival, he was noted to be in A-fib with RVR with heart rate in Edward [name] 150s.  He was treated with IV Cardizem.  He was not sure that with all Edward [name] vomiting whether his Eliquis and metoprolol stay down in his stomach.  Lab work on arrival included sodium 132, creatinine 1.44, BNP 712.6, white blood cell count 11.8, hemoglobin 10.8.  EKG showed atrial fibrillation with right bundle branch block, heart rate in Edward [name] 80s.  Chest x-ray showed bibasilar pulmonary infiltrate, likely infectious versus inflammatory.  He was diagnosed with bibasilar pneumonia.  Abdominal x-ray showed nonobstructive bowel gas pattern with moderate stool burden, no acute process.  COVID test negative.  Serial troponin 17--25.  Edward [name] [name] was given a single dose of 20 mg IV Lasix and has since been placed on antibiotic.  Cardiology service consulted for possible heart failure.    Past Medical History:  Diagnosis Date   Atherosclerosis of abdominal aorta (HCC)    CT/abd and pelvis   Carotid artery occlusion    bilateral carotid bruit, right greater then left -bilateral 40-50% stenosis, 12/26/09- no change  Chronic anxiety    Coronary artery disease    s/p BM stent, prox and mid RCA, 2001, cutting ballon RCA stenosis, 2002   Dysphagia    from esophageal dysmotility-tx with careful eating.    History of echocardiogram    2/11 echo EF 70%, mild MR, mildly elevated pulmonary pressures 42 mmHg   History of  renal angiogram    9/10, showed patent renal stents, 30-40% instent restenosis ws Edward [name] most severe lesion   Hyperlipidemia    Left kidney mass    lower pole, observing by urology- Dr. Reece [name]   Peripheral neuropathy    in both feet from nerve compression    Renovascular hypertension    s./p. bilateral RA stent implant   Subclavian artery stenosis Edward [name] Edward [name] [name])     Past Surgical History:  Procedure Laterality Date   CARDIAC CATHETERIZATION  2001   BM stent, prox and mid RCA   cataract surgery Bilateral 04/14   COLONOSCOPY     every 10 years   left foot surgery     RENAL ARTERY STENT  08/07     Home Medications:  Prior to Admission medications   Medication Sig Start Date End Date Taking? Authorizing Provider  apixaban (ELIQUIS) 5 MG TABS tablet Take 1 tablet (5 mg total) by mouth 2 (two) times daily. 06/27/22  Yes Edward [name] Colonel, NP  aspirin EC 81 MG tablet Take 1 tablet (81 mg total) by mouth daily. 06/01/18  Yes Lorretta Harp, MD  atorvastatin (LIPITOR) 40 MG tablet Take 1 tablet (40 mg total) by mouth daily. 06/27/22  Yes Edward [name] Colonel, NP  Calcium Citrate-Vitamin D (CITRACAL PETITES/VITAMIN D PO) Take 1 tablet by mouth every evening.   Yes [provider]  cloNIDine (CATAPRES) 0.1 MG tablet Take 2 tablets (0.2 mg total) by mouth daily. 08/15/22  Yes Lorretta Harp, MD  Coenzyme Q10 (CO Q-10) 100 MG CAPS Take 100 mg by mouth daily.   Yes [provider]  finasteride (PROSCAR) 5 MG tablet Take 5 mg by mouth daily. 12/16/16  Yes [provider]  folic acid (FOLVITE) 983 MCG tablet Take 400 mcg by mouth daily.   Yes [provider]  LORazepam (ATIVAN) 1 MG tablet Take 1 mg by mouth in Edward [name] morning and at bedtime. 08/05/13  Yes [provider]  metoprolol tartrate (LOPRESSOR) 50 MG tablet Take 1.5 tablets (75 mg total) by mouth 2 (two) times daily. 06/27/22 02/22/23 Yes Edward [name] Colonel, NP  Multiple Vitamins-Minerals (PRESERVISION  AREDS 2) CAPS Take by mouth.   Yes [provider]  Omega-3 Fatty Acids (FISH OIL) 1000 MG CAPS Take 1,200 mg by mouth in Edward [name] morning and at bedtime.   Yes [provider]  omeprazole (PRILOSEC) 20 MG capsule Take 20 mg by mouth daily.   Yes [provider]  Polyethyl Glycol-Propyl Glycol 0.4-0.3 % SOLN Apply 1 drop to eye at bedtime as needed (dryness).   Yes [provider]  tamsulosin (FLOMAX) 0.4 MG CAPS capsule Take 0.4 mg by mouth daily. 07/13/13  Yes [provider]  valsartan (DIOVAN) 320 MG tablet TAKE 1/2 TABLET BY MOUTH 2 TIMES DAILY. Edward [name] [name] taking differently: Take 160 mg by mouth in Edward [name] morning and at bedtime. 06/27/22  Yes Edward [name] Colonel, NP    Inpatient Medications: Scheduled Meds:  apixaban  5 mg Oral BID   aspirin EC  81 mg Oral Daily   atorvastatin  40 mg Oral Daily   bisacodyl  10 mg Rectal Once   cloNIDine  0.2 mg Oral Daily   finasteride  5 mg Oral Daily   metoprolol tartrate  75 mg Oral BID   pantoprazole  40 mg Oral Daily   senna-docusate  1 tablet Oral BID   tamsulosin  0.4 mg Oral Daily   Continuous Infusions:  cefTRIAXone (ROCEPHIN)  IV 2 g (09/18/22 0830)   doxycycline (VIBRAMYCIN) IV Stopped (09/18/22 0129)   PRN Meds: acetaminophen **OR** acetaminophen, ondansetron **OR** ondansetron (ZOFRAN) IV, mouth rinse  Allergies:    Allergies  Allergen Reactions   Amlodipine Other (See Comments)    Tolerates 2.5 mg, higher doses cause LEE with blistering   Erythromycin    Penicillins    Prednisone     Social History:   Social History   Socioeconomic History   Marital status: Married    Spouse name: Not on file   Number of children: Not on file   Years of education: Not on file   Highest education level: Not on file  Occupational History   Not on file  Tobacco Use   Smoking status: Never   Smokeless tobacco: Never  Vaping Use   Vaping Use: Never used  Substance and Sexual Activity   Alcohol use:  No   Drug use: No   Sexual activity: Not on file  Other Topics Concern   Not on file  Social History Narrative   Not on file   Social Determinants of Health   Financial Resource Strain: Not on file  Food Insecurity: No Food Insecurity (09/17/2022)   Hunger Vital Sign    Worried About Running Out of Food in Edward [name] Last Year: Never true    Ran Out of Food in Edward [name] Last Year: Never true  Transportation Needs: No Transportation Needs (09/17/2022)   PRAPARE - Hydrologist (Medical): No    Lack of Transportation (Non-Medical): No  Physical Activity: Not on file  Stress: Not on file  Social Connections: Not on file  Intimate Partner Violence: Not At Risk (09/17/2022)   Humiliation, Afraid, Rape, and Kick questionnaire    Fear of Current or Ex-Partner: No    Emotionally Abused: No    Physically Abused: No    Sexually Abused: No    Family History:    Family History  Problem Relation Age of Onset   Hypertension Mother    Hypertension Father      ROS:  Please see Edward [name] history of present illness.   All other ROS reviewed and negative.     Physical Exam/Data:   Vitals:   09/17/22 2132 09/17/22 2317 09/18/22 0456 09/18/22 0818  BP: (!) 156/86 (!) 144/69 (!) 155/87 (!) 176/92  Pulse: 89 90 96 (!) 107  Resp:  (!) 21  18  Temp:  98.4 F (36.9 C) 98.4 F (36.9 C) 98.8 F (37.1 C)  TempSrc:  Oral Oral Oral  SpO2:  98% 97% 97%  Weight:   76.5 kg   Height:        Intake/Output Summary (Last 24 hours) at 09/18/2022 1027 Last data filed at 09/18/2022 0844 Gross per 24 hour  Intake 1168.42 ml  Output 701 ml  Net 467.42 ml      09/18/2022    4:56 AM 09/17/2022    4:30 PM 06/27/2022   10:31 AM  Last 3 Weights  Weight (lbs) 168 lb 10.4 oz 168 lb 10.4 oz 170 lb 12.8 oz  Weight (kg) 76.5 kg 76.5  kg 77.474 kg     Body mass index is 26.41 kg/m.  General:  Well nourished, well developed, in no acute distress HEENT: normal Neck: no JVD Vascular: No  carotid bruits; Distal pulses 2+ bilaterally Cardiac:  normal S1, S2; irregularly irregular; no murmur  Lungs:  clear to auscultation bilaterally, no wheezing, rhonchi or rales  Abd: soft, nontender, no hepatomegaly  Ext: 1+ edema Musculoskeletal:  No deformities, BUE and BLE strength normal and equal Skin: warm and dry  Neuro:  CNs 2-12 intact, no focal abnormalities noted Psych:  Normal affect   EKG:  Edward [name] EKG was personally reviewed and demonstrates: Atrial fibrillation, right bundle branch block Telemetry:  Telemetry was personally reviewed and demonstrates: Atrial fibrillation, heart rate in Edward [name] high 90s to low 100 range.  Relevant CV Studies:  Echo 10/10/2021 1. Left ventricular ejection fraction, by estimation, is 60 to 65%. Edward [name]  left ventricle has normal function. Edward [name] left ventricle has no regional  wall motion abnormalities. There is mild concentric left ventricular  hypertrophy. Diastolic function is  indeterminant due to atrial fibrillation.   2. Right ventricular systolic function is normal. Edward [name] right ventricular  size is normal. There is mildly elevated pulmonary artery systolic  pressure. Edward [name] estimated right ventricular systolic pressure is 52.7 mmHg.   3. Left atrial size was moderately dilated.   4. Right atrial size was mildly dilated.   5. Edward [name] mitral valve is normal in structure. Mild mitral valve  regurgitation.   6. Tricuspid valve regurgitation is moderate.   7. Edward [name] aortic valve is tricuspid. There is mild calcification of Edward [name]  aortic valve. There is mild thickening of Edward [name] aortic valve. Aortic valve  regurgitation is trivial.   8. Edward [name] inferior vena cava is normal in size with greater than 50%  respiratory variability, suggesting right atrial pressure of 3 mmHg.   Comparison(s): Compared to prior TTE in 01/2021, Edward [name] Edward [name] [name] is now in  AFib. Otherwise, there is no significant change.    Laboratory Data:  High Sensitivity Troponin:   Recent Labs  Lab  09/17/22 0227 09/17/22 0410  TROPONINIHS 17 25*     Chemistry Recent Labs  Lab 09/17/22 0227 09/18/22 0037  NA 132* 127*  K 4.4 3.8  CL 95* 92*  CO2 24 24  GLUCOSE 127* 101*  BUN 32* 30*  CREATININE 1.44* 1.41*  CALCIUM 8.6* 7.8*  MG  --  1.4*  GFRNONAA 46* 47*  ANIONGAP 13 11    Recent Labs  Lab 09/17/22 0227 09/18/22 0037  PROT 6.6 5.6*  ALBUMIN 3.0* 2.5*  AST 38 33  ALT 37 30  ALKPHOS 109 73  BILITOT 0.8 1.1   Lipids No results for input(s): "CHOL", "TRIG", "HDL", "LABVLDL", "LDLCALC", "CHOLHDL" in Edward [name] last 168 hours.  Hematology Recent Labs  Lab 09/17/22 0227 09/18/22 0037  WBC 11.8* 9.5  RBC 3.51* 3.04*  HGB 10.8* 9.5*  HCT 32.5* 27.9*  MCV 92.6 91.8  MCH 30.8 31.3  MCHC 33.2 34.1  RDW 13.6 13.9  PLT 146* 116*   Thyroid No results for input(s): "TSH", "FREET4" in Edward [name] last 168 hours.  BNP Recent Labs  Lab 09/17/22 0227 09/18/22 0037  BNP 712.6* 468.8*    DDimer No results for input(s): "DDIMER" in Edward [name] last 168 hours.   Radiology/Studies:  DG Abd 1 View  Result Date: 09/17/2022 CLINICAL DATA:  Vomiting EXAM: ABDOMEN - 1 VIEW COMPARISON:  None Available. FINDINGS: Nonobstructive bowel-gas pattern. Moderate stool burden. No radio-opaque calculi or  other significant radiographic abnormality are seen. IMPRESSION: Nonobstructive bowel-gas pattern. Moderate stool burden. Electronically Signed   By: Yetta Glassman M.D.   On: 09/17/2022 09:21   DG Chest Portable 1 View  Result Date: 09/17/2022 CLINICAL DATA:  Dyspnea EXAM: PORTABLE CHEST 1 VIEW COMPARISON:  10/12/2021 FINDINGS: Bibasilar pulmonary infiltrates are present, likely infectious or inflammatory in Edward [name] acute setting. Pulmonary insufflation is normal and symmetric. No pneumothorax or pleural effusion. Cardiac size is within normal limits. Pulmonary vascularity is normal. No acute bone abnormality. Vascular calcifications are seen within Edward [name] right upper extremity arterial vasculature  IMPRESSION: 1. Bibasilar pulmonary infiltrates, likely infectious or inflammatory. Electronically Signed   By: Fidela Salisbury M.D.   On: 09/17/2022 02:49     Assessment and Plan:   Bibasilar pneumonia  -On physical exam, Edward [name] [name] has 1+ pitting edema in bilateral lower extremity, however his lung is largely clear on physical exam.  He has no significant JVD. BNP 700. I do not think Edward [name] Edward [name] [name] is significantly volume overloaded.  He received a single dose of 20 mg IV Lasix this morning, will hold off on giving additional dose of Lasix for now.  Hyponatremia: Likely related to recent vomiting and pneumonia.  Permanent atrial fibrillation: On Eliquis and metoprolol tartrate 75 mg twice a day at home.  Heart rate on arrival was initially elevated in Edward [name] setting of nausea, vomiting and bilateral pneumonia.  His current heart rate is now in Edward [name] high 90s to low 100 range on telemetry.  As his condition continued to improve, heart rate will also likely improve as well.  Shortness of breath and fatigue: Obtain repeat echocardiogram.  However suspect shortness of breath is likely more related to bilateral pneumonia and fatigue related to hyponatremia.  CAD: Denies any recent chest pain.  Remote history of BMS to RCA 20 years ago.  Carotid artery disease: Stable on last carotid ultrasound in January 2023  Hypertension: On clonidine, metoprolol tartrate, blood pressure elevated, consider restart home valsartan  Hyperlipidemia: On Lipitor   Risk Assessment/Risk Scores:        New York Heart Association (NYHA) Functional Class NYHA Class I  CHA2DS2-VASc Score = 4   This indicates a 4.8% annual risk of stroke. Edward [name] Edward [name] [name]'s score is based upon: CHF History: 0 HTN History: 1 Diabetes History: 0 Stroke History: 0 Vascular Disease History: 1 Age Score: 2 Gender Score: 0         For questions or updates, please contact [place] Please consult [email or web address] for contact info  under    Hilbert Corrigan, [place]  09/18/2022 10:27 AM

## 2022-09-18 NOTE — Evaluation (Signed)
Occupational Therapy Evaluation Patient Details Name: Edward [name] MRN: [id number] DOB: 04/10/1930 Today's Date: 09/18/2022   History of Present Illness 88 y.o. male presents to [facility] hospital on 09/17/2022 with vomiting, nausea, and rigors. Pt noted to be in afib, chest x-ray demonstrates bilateral PNA. PMH includes afib, HTN, CKD, HLD.   Clinical Impression   Pt independent at baseline with ADLs and functional mobility, lives with spouse and has assist with IADLs from housekeeper/someone comes in to cook meals. Pt currently needing min guard-min A for ADLs, mod I for bed mobility, and mod I for transfers without AD. SPO2 100% on RA during session, and pt able to demo good use of IS during session. Pt presenting with impairments listed below, will follow acutely. Anticipate no post-acute OT needs at d/c.     Recommendations for follow up therapy are one component of a multi-disciplinary discharge planning process, led by the attending physician.  Recommendations may be updated based on patient status, additional functional criteria and insurance authorization.   Follow Up Recommendations  No OT follow up     Assistance Recommended at Discharge Set up Supervision/Assistance  Patient can return home with the following A little help with walking and/or transfers;A little help with bathing/dressing/bathroom;Assistance with cooking/housework;Help with stairs or ramp for entrance;Assist for transportation    Functional Status Assessment  Patient has had a recent decline in their functional status and demonstrates the ability to make significant improvements in function in a reasonable and predictable amount of time.  Equipment Recommendations  None recommended by OT    Recommendations for Other Services PT consult     Precautions / Restrictions Precautions Precautions: Other (comment) Precaution Comments: monitor SpO2 and HR Restrictions Weight Bearing Restrictions: No       Mobility Bed Mobility Overal bed mobility: Modified Independent                  Transfers Overall transfer level: Modified independent Equipment used: None                      Balance Overall balance assessment: Mild deficits observed, not formally tested                                         ADL either performed or assessed with clinical judgement   ADL Overall ADL's : Needs assistance/impaired Eating/Feeding: Supervision/ safety   Grooming: Min guard;Oral care;Wash/dry face;Standing Grooming Details (indicate cue type and reason): standing at sink Upper Body Bathing: Minimal assistance   Lower Body Bathing: Minimal assistance   Upper Body Dressing : Minimal assistance   Lower Body Dressing: Minimal assistance   Toilet Transfer: Min guard;Ambulation;Regular Museum/gallery exhibitions officer and Hygiene: Supervision/safety       Functional mobility during ADLs: Min guard       Vision   Vision Assessment?: No apparent visual deficits     Perception Perception Perception Tested?: No   Praxis Praxis Praxis tested?: Not tested    Pertinent Vitals/Pain Pain Assessment Pain Assessment: No/denies pain     Hand Dominance     Extremity/Trunk Assessment Upper Extremity Assessment Upper Extremity Assessment: Overall WFL for tasks assessed   Lower Extremity Assessment Lower Extremity Assessment: Defer to PT evaluation   Cervical / Trunk Assessment Cervical / Trunk Assessment: Kyphotic   Communication Communication Communication: HOH   Cognition Arousal/Alertness: Awake/alert Behavior  During Therapy: WFL for tasks assessed/performed Overall Cognitive Status: Within Functional Limits for tasks assessed                                 General Comments: follows commands appropriately, able to provide PLOF     General Comments  VSS on RA , SpO2 100% on RA, HR up to 115 with in room ambulation     Exercises     Shoulder Instructions      Home Living Family/patient expects to be discharged to:: Private residence Living Arrangements: Spouse/significant other (has someone that comes in to cook/clean) Available Help at Discharge: Family (spose has PCA) Type of Home: House Home Access: Stairs to enter Technical brewer of Steps: 1+1 Entrance Stairs-Rails: Right Home Layout: One level     Bathroom Shower/Tub: Walk-in shower         Home Equipment: Kasandra Knudsen - single point          Prior Functioning/Environment Prior Level of Function : Independent/Modified Independent             Mobility Comments: no AD use ADLs Comments: ind with ADLs        OT Problem List: Decreased activity tolerance;Decreased strength      OT Treatment/Interventions: Self-care/ADL training;Therapeutic exercise;DME and/or AE instruction;Energy conservation;Therapeutic activities;Patient/family education;Balance training    OT Goals(Current goals can be found in the care plan section) Acute Rehab OT Goals Patient Stated Goal: none stated OT Goal Formulation: With patient Time For Goal Achievement: 10/02/22 Potential to Achieve Goals: Good ADL Goals Pt Will Perform Lower Body Dressing: with modified independence;sitting/lateral leans;sit to/from stand Additional ADL Goal #1: pt will perform standing task x10 min in order to improve activity tolerance for ADLs Additional ADL Goal #2: pt will verbalize 3 energy conservaton strategies in prep for ADLs  OT Frequency: Min 2X/week    Co-evaluation              AM-PAC OT "6 Clicks" Daily Activity     Outcome Measure Help from another person eating meals?: None Help from another person taking care of personal grooming?: A Little Help from another person toileting, which includes using toliet, bedpan, or urinal?: A Little Help from another person bathing (including washing, rinsing, drying)?: A Little Help from another person to put on  and taking off regular upper body clothing?: A Little Help from another person to put on and taking off regular lower body clothing?: A Little 6 Click Score: 19   End of Session Equipment Utilized During Treatment: Gait belt Nurse Communication: Mobility status;Precautions;Weight bearing status  Activity Tolerance: Patient tolerated treatment well Patient left: Other (comment) (handoff to MT at doorway)  OT Visit Diagnosis: Unsteadiness on feet (R26.81);Other abnormalities of gait and mobility (R26.89);Muscle weakness (generalized) (M62.81)                Time: 9390-3009 OT Time Calculation (min): 23 min Charges:  OT General Charges $OT Visit: 1 Visit OT Evaluation $OT Eval Low Complexity: 1 Low OT Treatments $Self Care/Home Management : 8-22 mins  Renaye Rakers, OTD, OTR/L SecureChat Preferred Acute Rehab [phone]  Renaye Rakers [place] 09/18/2022, 10:25 AM

## 2022-09-18 NOTE — Progress Notes (Signed)
PT alert and oriented. VSS. No s/s distress. Able to make needs known. Ambulating to bathroom with stand by assist. Had Elmo rider this morning, tolerated well. Left lower AC/Elbow has edema, verified placement of IV, excellent blood return. Recommended PT elevate.  PT states when ECHO was done the arm got bumped. No s/s injury at this time. Family was in most of the day, at bedside, updated by this RN and MD.

## 2022-09-18 NOTE — Progress Notes (Incomplete)
Echocardiogram 2D Echocardiogram has been performed.  Edward [name] 09/18/2022, 1:21 PM

## 2022-09-18 NOTE — Progress Notes (Signed)
PROGRESS NOTE    Edward [name]  ZOX:[id number] DOB: 1930/01/18 DOA: 09/17/2022 PCP: Mayra Neer, MD   Brief Narrative: 88 year old, history of permanent A-fib on Eliquis, hypertension, CKD stage IIIa baseline creatinine 1.2--1.4, history of hyponatremia, presented the ER with persistent nausea vomiting that started around 4 PM yesterday.  He vomited multiple times, initially food contents subsequently clear liquid.  He does not last bowel movement. Evaluation in the ED consistent with bilateral lower lobe pneumonia, mild leukocytosis.  Elevated BNP.  Admitted for further treatments. Report cough.    Assessment & Plan:   Principal Problem:   Bilateral pneumonia Active Problems:   Rapid atrial fibrillation (HCC)   Essential hypertension   Stage 3a chronic kidney disease (CKD) (HCC) - baseline SCr 8.1-1.9   Acute diastolic congestive heart failure ([place])   Community acquired pneumonia  1-Bilateral pneumonia: Presented with nausea vomiting, cough, chest x-ray showed bibasilar pulmonary infiltrates. Continue with IV Ceftriaxone, and Doxy. Incentive spirometry.  WBC trending down.   2-Nausea vomiting: Could be related to gastroenteritis, food poisoning, or in the setting of PNA.   KUB; nonobstructive bowel gas pattern. Moderate stool burden.  Started  Bowel regimen.  Resolved.  Advanced diet to regular.   3-A fib RVR He was in rapid A-fib with EMS.  Received IV Cardizem.   Continue with  Lopressor will 75 twice daily.  Echo ordered. Pending.  Mild elevation troponin in setting PNA, A fib.   Essential hypertension: Continue with Lopressor. BP increasing resume clonidine.  Hydralazine started by cardio.   Stage IIIa CKD creatinine baseline 1.2--1.4. Stable, Will monitor  Mild Hyponatremia; Hold Diovan. Encourage oral intake.   Chronic Diastolic HF;  Elevated BNP on admission, sodium down to 127. Cardiology consulted for evaluation.  Hypomagnesemia; Replete IV.    Estimated body mass index is 26.41 kg/m as calculated from the following:   Height as of this encounter: '5\' 7"'$  (1.702 m).   Weight as of this encounter: 76.5 kg.   DVT prophylaxis: Eliquis Code Status: Full code Family Communication: Care discussed with family  Disposition Plan:  Status is: Observation The patient remains OBS appropriate and will d/c before 2 midnights.    Consultants:  none  Procedures:  ECHO  Antimicrobials:  Ceftriaxone, doxy   Subjective: He report feeling ok, report some LE edema. No more cough. Denies further vomiting. He was able to have BM<   Objective: Vitals:   09/17/22 2317 09/18/22 0456 09/18/22 0818 09/18/22 1300  BP: (!) 144/69 (!) 155/87 (!) 176/92 (!) 164/64  Pulse: 90 96 (!) 107 93  Resp: (!) 21  18   Temp: 98.4 F (36.9 C) 98.4 F (36.9 C) 98.8 F (37.1 C)   TempSrc: Oral Oral Oral   SpO2: 98% 97% 97%   Weight:  76.5 kg    Height:        Intake/Output Summary (Last 24 hours) at 09/18/2022 1355 Last data filed at 09/18/2022 1300 Gross per 24 hour  Intake 1158.42 ml  Output 1000 ml  Net 158.42 ml    Filed Weights   09/17/22 1630 09/18/22 0456  Weight: 76.5 kg 76.5 kg    Examination:  General exam: NAD Respiratory system: CTA Cardiovascular system: S 1, S 2 IRR Gastrointestinal system: BS present, soft, nt Central nervous system: Alert, follows command Extremities: trace edema    Data Reviewed: I have personally reviewed following labs and imaging studies  CBC: Recent Labs  Lab 09/17/22 0227 09/18/22 0037  WBC 11.8* 9.5  NEUTROABS 10.9* 7.9*  HGB 10.8* 9.5*  HCT 32.5* 27.9*  MCV 92.6 91.8  PLT 146* 116*    Basic Metabolic Panel: Recent Labs  Lab 09/17/22 0227 09/18/22 0037  NA 132* 127*  K 4.4 3.8  CL 95* 92*  CO2 24 24  GLUCOSE 127* 101*  BUN 32* 30*  CREATININE 1.44* 1.41*  CALCIUM 8.6* 7.8*  MG  --  1.4*    GFR: Estimated Creatinine Clearance: 32.6 mL/min (A) (by C-G formula based  on SCr of 1.41 mg/dL (H)). Liver Function Tests: Recent Labs  Lab 09/17/22 0227 09/18/22 0037  AST 38 33  ALT 37 30  ALKPHOS 109 73  BILITOT 0.8 1.1  PROT 6.6 5.6*  ALBUMIN 3.0* 2.5*    Recent Labs  Lab 09/17/22 0410  LIPASE 27    No results for input(s): "AMMONIA" in the last 168 hours. Coagulation Profile: Recent Labs  Lab 09/17/22 0227  INR 1.6*    Cardiac Enzymes: No results for input(s): "CKTOTAL", "CKMB", "CKMBINDEX", "TROPONINI" in the last 168 hours. BNP (last 3 results) No results for input(s): "PROBNP" in the last 8760 hours. HbA1C: No results for input(s): "HGBA1C" in the last 72 hours. CBG: No results for input(s): "GLUCAP" in the last 168 hours. Lipid Profile: No results for input(s): "CHOL", "HDL", "LDLCALC", "TRIG", "CHOLHDL", "LDLDIRECT" in the last 72 hours. Thyroid Function Tests: No results for input(s): "TSH", "T4TOTAL", "FREET4", "T3FREE", "THYROIDAB" in the last 72 hours. Anemia Panel: No results for input(s): "VITAMINB12", "FOLATE", "FERRITIN", "TIBC", "IRON", "RETICCTPCT" in the last 72 hours. Sepsis Labs: No results for input(s): "PROCALCITON", "LATICACIDVEN" in the last 168 hours.  Recent Results (from the past 240 hour(s))  SARS Coronavirus 2 by RT PCR (hospital order, performed in [facility] hospital lab) *cepheid single result test* Anterior Nasal Swab     Status: None   Collection Time: 09/17/22  3:44 AM   Specimen: Anterior Nasal Swab  Result Value Ref Range Status   SARS Coronavirus 2 by RT PCR NEGATIVE NEGATIVE Final    Comment: (NOTE) SARS-CoV-2 target nucleic acids are NOT DETECTED.  The SARS-CoV-2 RNA is generally detectable in upper and lower respiratory specimens during the acute phase of infection. The lowest concentration of SARS-CoV-2 viral copies this assay can detect is 250 copies / mL. A negative result does not preclude SARS-CoV-2 infection and should not be used as the sole basis for treatment or other patient  management decisions.  A negative result may occur with improper specimen collection / handling, submission of specimen other than nasopharyngeal swab, presence of viral mutation(s) within the areas targeted by this assay, and inadequate number of viral copies (<250 copies / mL). A negative result must be combined with clinical observations, patient history, and epidemiological information.  Fact Sheet for Patients:   https://www.patel.info/  Fact Sheet for Healthcare Providers: https://hall.com/  This test is not yet approved or  cleared by the [place] FDA and has been authorized for detection and/or diagnosis of SARS-CoV-2 by FDA under an Emergency Use Authorization (EUA).  This EUA will remain in effect (meaning this test can be used) for the duration of the COVID-19 declaration under Section 564(b)(1) of the Act, 21 U.S.C. section 360bbb-3(b)(1), unless the authorization is terminated or revoked sooner.  Performed at [facility], [place] [address] [place], [place] [address]          Radiology Studies: DG Abd 1 View  Result Date: 09/17/2022 CLINICAL DATA:  Vomiting EXAM: ABDOMEN -  1 VIEW COMPARISON:  None Available. FINDINGS: Nonobstructive bowel-gas pattern. Moderate stool burden. No radio-opaque calculi or other significant radiographic abnormality are seen. IMPRESSION: Nonobstructive bowel-gas pattern. Moderate stool burden. Electronically Signed   By: Yetta Glassman M.D.   On: 09/17/2022 09:21   DG Chest Portable 1 View  Result Date: 09/17/2022 CLINICAL DATA:  Dyspnea EXAM: PORTABLE CHEST 1 VIEW COMPARISON:  10/12/2021 FINDINGS: Bibasilar pulmonary infiltrates are present, likely infectious or inflammatory in the acute setting. Pulmonary insufflation is normal and symmetric. No pneumothorax or pleural effusion. Cardiac size is within normal limits. Pulmonary vascularity is normal. No acute bone abnormality. Vascular  calcifications are seen within the right upper extremity arterial vasculature IMPRESSION: 1. Bibasilar pulmonary infiltrates, likely infectious or inflammatory. Electronically Signed   By: Fidela Salisbury M.D.   On: 09/17/2022 02:49        Scheduled Meds: Continuous Infusions:   LOS: 0 days    Time spent: 35 minutes    Donetta Isaza A Haydin Calandra, MD [facility]   If 7PM-7AM, please contact night-coverage [email or web address]  09/18/2022, 1:55 PM

## 2022-09-18 NOTE — Progress Notes (Signed)
Mobility Specialist Progress Note    09/18/22 1022  Mobility  Activity Ambulated with assistance in hallway  Level of Assistance Contact guard assist, steadying assist  Assistive Device Other (Comment) (HHA)  Distance Ambulated (ft) 250 ft  Activity Response Tolerated well  Mobility Referral Yes  $Mobility charge 1 Mobility   During Mobility: 120 HR Post-Mobility: 108 HR, 164/92 (114) BP, 96% SpO2  Pt received at sink with OT and agreeable. No complaints. Pt a little SOB with exertion and SpO2 read 91% on RA when returned to chair. Encouraged pursed lip breathing. Left with call bell in reach.   Hildred Alamin Mobility Specialist  Please Psychologist, sport and exercise or Rehab Office at [phone]

## 2022-09-18 NOTE — TOC Progression Note (Signed)
Transition of Care [facility]) - Progression Note    Patient Details  Name: Edward [name] MRN: [id number] Date of Birth: 1930-05-19  Transition of Care [facility]) CM/SW Contact  Zenon Mayo, RN Phone Number: 09/18/2022, 12:14 PM  Clinical Narrative:    from home with spouse, BIl PNA, afib RVR, conts on iv abx.  TOC following.        Expected Discharge Plan and Services                                                 Social Determinants of Health (SDOH) Interventions    Readmission Risk Interventions     No data to display

## 2022-09-19 ENCOUNTER — Other Ambulatory Visit (HOSPITAL_COMMUNITY): Payer: Self-pay

## 2022-09-19 DIAGNOSIS — I10 Essential (primary) hypertension: Secondary | ICD-10-CM | POA: Diagnosis not present

## 2022-09-19 DIAGNOSIS — I5033 Acute on chronic diastolic (congestive) heart failure: Secondary | ICD-10-CM | POA: Diagnosis not present

## 2022-09-19 DIAGNOSIS — I272 Pulmonary hypertension, unspecified: Secondary | ICD-10-CM | POA: Diagnosis not present

## 2022-09-19 LAB — CBC
HCT: 29.4 % — ABNORMAL LOW (ref 39.0–52.0)
Hemoglobin: 10.1 g/dL — ABNORMAL LOW (ref 13.0–17.0)
MCH: 31.3 pg (ref 26.0–34.0)
MCHC: 34.4 g/dL (ref 30.0–36.0)
MCV: 91 fL (ref 80.0–100.0)
Platelets: 122 10*3/uL — ABNORMAL LOW (ref 150–400)
RBC: 3.23 MIL/uL — ABNORMAL LOW (ref 4.22–5.81)
RDW: 13.4 % (ref 11.5–15.5)
WBC: 6.3 10*3/uL (ref 4.0–10.5)
nRBC: 0 % (ref 0.0–0.2)

## 2022-09-19 LAB — BASIC METABOLIC PANEL
Anion gap: 7 (ref 5–15)
BUN: 30 mg/dL — ABNORMAL HIGH (ref 8–23)
CO2: 25 mmol/L (ref 22–32)
Calcium: 8.1 mg/dL — ABNORMAL LOW (ref 8.9–10.3)
Chloride: 96 mmol/L — ABNORMAL LOW (ref 98–111)
Creatinine, Ser: 1.33 mg/dL — ABNORMAL HIGH (ref 0.61–1.24)
GFR, Estimated: 51 mL/min — ABNORMAL LOW (ref >60)
Glucose, Bld: 116 mg/dL — ABNORMAL HIGH (ref 70–99)
Potassium: 4.1 mmol/L (ref 3.5–5.1)
Sodium: 128 mmol/L — ABNORMAL LOW (ref 135–145)

## 2022-09-19 LAB — MAGNESIUM: Magnesium: 1.9 mg/dL (ref 1.7–2.4)

## 2022-09-19 MED ORDER — IRBESARTAN 300 MG PO TABS
150.0000 mg | ORAL_TABLET | Freq: Every day | ORAL | Status: DC
  Administered 2022-09-19 – 2022-09-21 (×3): 150 mg via ORAL
  Filled 2022-09-19 (×3): qty 1

## 2022-09-19 MED ORDER — FUROSEMIDE 10 MG/ML IJ SOLN
40.0000 mg | Freq: Two times a day (BID) | INTRAMUSCULAR | Status: AC
  Administered 2022-09-19 (×2): 40 mg via INTRAVENOUS
  Filled 2022-09-19 (×2): qty 4

## 2022-09-19 NOTE — Progress Notes (Signed)
Rounding Note    Patient Name: Edward [name] Date of Encounter: 09/19/2022  [place] Cardiologist: Quay Burow, MD   Subjective   Feeling well.  No CP/SOB.  Inpatient Medications    Scheduled Meds:  apixaban  5 mg Oral BID   aspirin EC  81 mg Oral Daily   atorvastatin  40 mg Oral Daily   bisacodyl  10 mg Rectal Once   cloNIDine  0.2 mg Oral Daily   doxycycline  100 mg Oral Q12H   finasteride  5 mg Oral Daily   magnesium oxide  200 mg Oral BID   metoprolol tartrate  75 mg Oral BID   pantoprazole  40 mg Oral Daily   senna-docusate  1 tablet Oral BID   tamsulosin  0.4 mg Oral Daily   Continuous Infusions:  cefTRIAXone (ROCEPHIN)  IV 2 g (09/18/22 0830)   PRN Meds: acetaminophen **OR** acetaminophen, ondansetron **OR** ondansetron (ZOFRAN) IV, mouth rinse   Vital Signs    Vitals:   09/18/22 2051 09/19/22 0006 09/19/22 0533 09/19/22 0743  BP:  (!) 148/85 (!) 156/92 (!) 159/87  Pulse: 97 90 75   Resp:  (!) 24 (!) 23 18  Temp:  98.3 F (36.8 C) 98.3 F (36.8 C) 98.2 F (36.8 C)  TempSrc:  Oral Oral Oral  SpO2:  100% 99% 98%  Weight:   76.6 kg   Height:        Intake/Output Summary (Last 24 hours) at 09/19/2022 0840 Last data filed at 09/18/2022 1300 Gross per 24 hour  Intake 480 ml  Output 300 ml  Net 180 ml      09/19/2022    5:33 AM 09/18/2022    4:56 AM 09/17/2022    4:30 PM  Last 3 Weights  Weight (lbs) 168 lb 14.4 oz 168 lb 10.4 oz 168 lb 10.4 oz  Weight (kg) 76.613 kg 76.5 kg 76.5 kg      Telemetry    Atrial fibrillation.  Rate <100 bpm.  PVCs - Personally Reviewed  ECG    N/a - Personally Reviewed  Physical Exam   VS:  BP (!) 159/87   Pulse 75   Temp 98.2 F (36.8 C) (Oral)   Resp 18   Ht '5\' 7"'$  (1.702 m)   Wt 76.6 kg   SpO2 98%   BMI 26.45 kg/m  , BMI Body mass index is 26.45 kg/m. GENERAL:  Well appearing HEENT: Pupils equal round and reactive, fundi not visualized, oral mucosa unremarkable NECK:  No  jugular venous distention, waveform within normal limits, carotid upstroke brisk and symmetric, no bruits, no thyromegaly LUNGS:  Clear to auscultation bilaterally HEART:  Irregularly irregular  PMI not displaced or sustained,S1 and S2 within normal limits, no S3, no S4, no clicks, no rubs, II/VI systolic murmur ABD:  Flat, positive bowel sounds normal in frequency in pitch, no bruits, no rebound, no guarding, no midline pulsatile mass, no hepatomegaly, no splenomegaly EXT:  2 plus pulses throughout, 1+ LE edema to lower tibia bilaterally, no cyanosis no clubbing SKIN:  No rashes no nodules NEURO:  Cranial nerves II through XII grossly intact, motor grossly intact throughout PSYCH:  Cognitively intact, oriented to person place and time   Labs    High Sensitivity Troponin:   Recent Labs  Lab 09/17/22 0227 09/17/22 0410  TROPONINIHS 17 25*     Chemistry Recent Labs  Lab 09/17/22 0227 09/18/22 0037 09/19/22 0702  NA 132* 127* 128*  K  4.4 3.8 4.1  CL 95* 92* 96*  CO2 '24 24 25  '$ GLUCOSE 127* 101* 116*  BUN 32* 30* 30*  CREATININE 1.44* 1.41* 1.33*  CALCIUM 8.6* 7.8* 8.1*  MG  --  1.4*  --   PROT 6.6 5.6*  --   ALBUMIN 3.0* 2.5*  --   AST 38 33  --   ALT 37 30  --   ALKPHOS 109 73  --   BILITOT 0.8 1.1  --   GFRNONAA 46* 47* 51*  ANIONGAP '13 11 7    '$ Lipids No results for input(s): "CHOL", "TRIG", "HDL", "LABVLDL", "LDLCALC", "CHOLHDL" in the last 168 hours.  Hematology Recent Labs  Lab 09/17/22 0227 09/18/22 0037 09/19/22 0702  WBC 11.8* 9.5 6.3  RBC 3.51* 3.04* 3.23*  HGB 10.8* 9.5* 10.1*  HCT 32.5* 27.9* 29.4*  MCV 92.6 91.8 91.0  MCH 30.8 31.3 31.3  MCHC 33.2 34.1 34.4  RDW 13.6 13.9 13.4  PLT 146* 116* 122*   Thyroid No results for input(s): "TSH", "FREET4" in the last 168 hours.  BNP Recent Labs  Lab 09/17/22 0227 09/18/22 0037  BNP 712.6* 468.8*    DDimer No results for input(s): "DDIMER" in the last 168 hours.   Radiology    ECHOCARDIOGRAM  COMPLETE  Result Date: 09/18/2022    ECHOCARDIOGRAM REPORT   Patient Name:   AZAAN LEASK [facility] Date of Exam: 09/18/2022 Medical Rec #:  [id number]      Height:       67.0 in Accession #:    [id number]     Weight:       168.7 lb Date of Birth:  03-24-1930     BSA:          1.881 m Patient Age:    40 years       BP:           155/87 mmHg Patient Gender: M              HR:           102 bpm. Exam Location:  Inpatient Procedure: 2D Echo, Cardiac Doppler and Color Doppler Indications:    Atrial Fibrillation I48.91  History:        Patient has prior history of Echocardiogram examinations, most                 recent 10/10/2022. CAD, Arrythmias:Atrial Fibrillation; Risk                 Factors:Hypertension and Dyslipidemia. Stage 3a chronic kidney                 disease (CKD).  Sonographer:    Ronny Flurry Referring Phys: [place]  1. Compared to previous echo, LVEF and RVEF are unchanged; TR is more severe.  2. Left ventricular ejection fraction, by estimation, is 55 to 60%. The left ventricle has normal function. The left ventricle has no regional wall motion abnormalities. There is mild left ventricular hypertrophy. Left ventricular diastolic parameters are indeterminate.  3. Right ventricular systolic function is low normal. The right ventricular size is mildly enlarged. There is severely elevated pulmonary artery systolic pressure.  4. Left atrial size was moderately dilated.  5. Right atrial size was severely dilated.  6. A small pericardial effusion is present.  7. Mild mitral valve regurgitation.  8. Tricuspid valve regurgitation is severe.  9. The aortic valve is tricuspid. Aortic valve regurgitation is not visualized. Aortic valve sclerosis  is present, with no evidence of aortic valve stenosis. 10. The inferior vena cava is dilated in size with <50% respiratory variability, suggesting right atrial pressure of 15 mmHg. FINDINGS  Left Ventricle: Left ventricular ejection fraction, by  estimation, is 55 to 60%. The left ventricle has normal function. The left ventricle has no regional wall motion abnormalities. The left ventricular internal cavity size was normal in size. There is  mild left ventricular hypertrophy. Left ventricular diastolic parameters are indeterminate. Right Ventricle: The right ventricular size is mildly enlarged. Right vetricular wall thickness was not assessed. Right ventricular systolic function is low normal. There is severely elevated pulmonary artery systolic pressure. The tricuspid regurgitant velocity is 3.67 m/s, and with an assumed right atrial pressure of 15 mmHg, the estimated right ventricular systolic pressure is 09.3 mmHg. Left Atrium: Left atrial size was moderately dilated. Right Atrium: Right atrial size was severely dilated. Pericardium: A small pericardial effusion is present. Mitral Valve: There is moderate thickening of the mitral valve leaflet(s). Mild mitral valve regurgitation. MV peak gradient, 4.5 mmHg. The mean mitral valve gradient is 2.0 mmHg. Tricuspid Valve: The tricuspid valve is normal in structure. Tricuspid valve regurgitation is severe. Aortic Valve: The aortic valve is tricuspid. Aortic valve regurgitation is not visualized. Aortic valve sclerosis is present, with no evidence of aortic valve stenosis. Aortic valve mean gradient measures 3.0 mmHg. Aortic valve peak gradient measures 4.4  mmHg. Aortic valve area, by VTI measures 2.81 cm. Pulmonic Valve: The pulmonic valve was normal in structure. Pulmonic valve regurgitation is mild. Aorta: The aortic root and ascending aorta are structurally normal, with no evidence of dilitation. Venous: The inferior vena cava is dilated in size with less than 50% respiratory variability, suggesting right atrial pressure of 15 mmHg. IAS/Shunts: No atrial level shunt detected by color flow Doppler.  LEFT VENTRICLE PLAX 2D LVIDd:         4.10 cm LVIDs:         3.30 cm LV PW:         1.20 cm LV IVS:         1.10 cm LVOT diam:     2.10 cm LV SV:         51 LV SV Index:   27 LVOT Area:     3.46 cm  RIGHT VENTRICLE            IVC RV S prime:     5.11 cm/s  IVC diam: 2.70 cm TAPSE (M-mode): 1.0 cm LEFT ATRIUM             Index        RIGHT ATRIUM           Index LA diam:        4.60 cm 2.45 cm/m   RA Area:     24.70 cm LA Vol (A2C):   53.3 ml 28.34 ml/m  RA Volume:   75.40 ml  40.09 ml/m LA Vol (A4C):   72.4 ml 38.49 ml/m LA Biplane Vol: 62.2 ml 33.07 ml/m  AORTIC VALVE AV Area (Vmax):    2.41 cm AV Area (Vmean):   2.43 cm AV Area (VTI):     2.81 cm AV Vmax:           105.00 cm/s AV Vmean:          77.300 cm/s AV VTI:            0.182 m AV Peak Grad:  4.4 mmHg AV Mean Grad:      3.0 mmHg LVOT Vmax:         73.20 cm/s LVOT Vmean:        54.200 cm/s LVOT VTI:          0.148 m LVOT/AV VTI ratio: 0.81  AORTA Ao Root diam: 2.80 cm Ao Asc diam:  3.00 cm MITRAL VALVE                TRICUSPID VALVE MV Area VTI:  2.27 cm      TR Peak grad:   53.9 mmHg MV Peak grad: 4.5 mmHg      TR Vmax:        367.00 cm/s MV Mean grad: 2.0 mmHg MV Vmax:      1.06 m/s      SHUNTS MV Vmean:     60.3 cm/s     Systemic VTI:  0.15 m MV E velocity: 105.00 cm/s  Systemic Diam: 2.10 cm Dorris Carnes MD Electronically signed by Dorris Carnes MD Signature Date/Time: 09/18/2022/3:50:31 PM    Final    DG Abd 1 View  Result Date: 09/17/2022 CLINICAL DATA:  Vomiting EXAM: ABDOMEN - 1 VIEW COMPARISON:  None Available. FINDINGS: Nonobstructive bowel-gas pattern. Moderate stool burden. No radio-opaque calculi or other significant radiographic abnormality are seen. IMPRESSION: Nonobstructive bowel-gas pattern. Moderate stool burden. Electronically Signed   By: Yetta Glassman M.D.   On: 09/17/2022 09:21    Cardiac Studies   Echo 09/18/22: 1. Compared to previous echo, LVEF and RVEF are unchanged; TR is more  severe.   2. Left ventricular ejection fraction, by estimation, is 55 to 60%. The  left ventricle has normal function. The left  ventricle has no regional  wall motion abnormalities. There is mild left ventricular hypertrophy.  Left ventricular diastolic parameters  are indeterminate.   3. Right ventricular systolic function is low normal. The right  ventricular size is mildly enlarged. There is severely elevated pulmonary  artery systolic pressure.   4. Left atrial size was moderately dilated.   5. Right atrial size was severely dilated.   6. A small pericardial effusion is present.   7. Mild mitral valve regurgitation.   8. Tricuspid valve regurgitation is severe.   9. The aortic valve is tricuspid. Aortic valve regurgitation is not  visualized. Aortic valve sclerosis is present, with no evidence of aortic  valve stenosis.  10. The inferior vena cava is dilated in size with <50% respiratory  variability, suggesting right atrial pressure of 15 mmHg.   Patient Profile     Mr. Lapage is a 26M with permanent atrial fibrillation, carotid stenosis, subclavian stenosis, hypertension, hyperlipidemia, hyponatremia, HFpEF admitted with pneumonia in the setting of nausea and vomiting.  Cardiology consulted for atrial fibrillation with RVR, elevated troponin and elevated BNP.   Assessment & Plan    # Acute on chronic diastolic heart failure: # Shortess of breath: # Severe pulmonary HTN: # Severe TR:  BNP is elevated to 468.  Echo showed grade II diastolic dysfunction, severe TR, RA pressure 15 mmHg.  Will give two doses of IV lasix.  Suspect this will help his hyponatremia. Continue with pneumonia management per IM.  Add SGLT2 inhibitor prior to discharge.  Unclear why he has severe pulmonary HTN and severe TR.  May all be related to HFpEF and elevated pulmonary pressure.  He denies smoking history.  Rule out chronic thromboembolic disease if pressure remains severely elevated after diuresis.    #  Hypertension:  BP uncontrolled.  Continue clonidine and metoprolol. Resume home ARB   # Permanent atrial  fibrillation: Continue Eliquis and metoprolol.  Rate control improving with pneumonia management.    # CAD:  # Elevated troponin:  # hyperlipidemia: Remote PCI.  Hs-troponin mildly elevated.  He has no chest pain.  Echo pending.  No plan for an ischemic evaluation at this time.  Continue atorvastatin.  He is on aspirin.  Unclear he needs that in addition to Eliquis.      For questions or updates, please contact [place] Please consult [email or web address] for contact info under        Signed, Skeet Latch, MD  09/19/2022, 8:40 AM

## 2022-09-19 NOTE — Progress Notes (Signed)
Physical Therapy Treatment Patient Details Name: Edward [name] MRN: [id number] DOB: [birth year] Today's Date: 09/19/2022   History of Present Illness 88 y.o. male presents to [facility] hospital on 09/17/2022 with vomiting, nausea, and rigors. Pt noted to be in afib, chest x-ray demonstrates bilateral PNA. PMH includes afib, HTN, CKD, HLD.    PT Comments    Pt endorsing feeling more fatigued today given multiple trips to the bathroom (on a diuretic). Pt ambulatory in hallway without AD and no overt unsteadiness, does require cessation of gait given fatigue and min dyspnea. SpO2 WFL on RA when accurate (see below) and HR WFL. Pt tolerated repeated sit<>stands after gait. Will continue to follow.   SpO2 reading initially 86% on RA with DOE 2/4 post-gait, improved to 91% with change in finger    Recommendations for follow up therapy are one component of a multi-disciplinary discharge planning process, led by the attending physician.  Recommendations may be updated based on patient status, additional functional criteria and insurance authorization.  Follow Up Recommendations  No PT follow up     Assistance Recommended at Discharge PRN  Patient can return home with the following Assistance with cooking/housework   Equipment Recommendations  None recommended by PT    Recommendations for Other Services       Precautions / Restrictions Precautions Precautions: Other (comment);Fall Precaution Comments: monitor SpO2 and HR Restrictions Weight Bearing Restrictions: No     Mobility  Bed Mobility Overal bed mobility: Needs Assistance Bed Mobility: Sit to Supine, Supine to Sit     Supine to sit: Supervision, HOB elevated Sit to supine: Supervision, HOB elevated        Transfers Overall transfer level: Modified independent Equipment used: None               General transfer comment: slow to rise initially, improved with repeated sit<>stands    Ambulation/Gait Ambulation/Gait  assistance: Supervision Gait Distance (Feet): 150 Feet Assistive device: None Gait Pattern/deviations: Step-through pattern, Decreased stride length, Drifts right/left Gait velocity: decr     General Gait Details: slowed, but improved speed with further distance. SpO2 reading initially 86% on RA with DOE 2/4 post-gait, improved to 91% with change in finger   Stairs             Wheelchair Mobility    Modified Rankin (Stroke Patients Only)       Balance Overall balance assessment: Mild deficits observed, not formally tested                                          Cognition Arousal/Alertness: Awake/alert Behavior During Therapy: WFL for tasks assessed/performed Overall Cognitive Status: Within Functional Limits for tasks assessed                                          Exercises Other Exercises Other Exercises: sit<>stand x10 EOB, no UE support    General Comments        Pertinent Vitals/Pain Pain Assessment Pain Assessment: Faces Faces Pain Scale: No hurt Pain Intervention(s): Monitored during session    Home Living                          Prior Function  PT Goals (current goals can now be found in the care plan section) Acute Rehab PT Goals Patient Stated Goal: to return home for 89th birthday party PT Goal Formulation: With patient Time For Goal Achievement: 10/01/22 Potential to Achieve Goals: Good Additional Goals Additional Goal #1: Pt will ambulate for >1000', reporting a DOE rating of 1/4 or less, in an effort to demonstrate improved activity tolerance Progress towards PT goals: Progressing toward goals    Frequency    Min 3X/week      PT Plan Current plan remains appropriate    Co-evaluation              AM-PAC PT "6 Clicks" Mobility   Outcome Measure  Help needed turning from your back to your side while in a flat bed without using bedrails?: None Help needed  moving from lying on your back to sitting on the side of a flat bed without using bedrails?: None Help needed moving to and from a bed to a chair (including a wheelchair)?: None Help needed standing up from a chair using your arms (e.g., wheelchair or bedside chair)?: None Help needed to walk in hospital room?: A Little Help needed climbing 3-5 steps with a railing? : A Little 6 Click Score: 22    End of Session   Activity Tolerance: Patient tolerated treatment well Patient left: in bed;with call bell/phone within reach;with family/visitor present Nurse Communication: Mobility status PT Visit Diagnosis: Other abnormalities of gait and mobility (R26.89);Muscle weakness (generalized) (M62.81)     Time: 1610-9604 PT Time Calculation (min) (ACUTE ONLY): 23 min  Charges:  $Gait Training: 8-22 mins                     Stacie Glaze, PT DPT Acute Rehabilitation Services Pager [phone]  Office [phone]    [place] 09/19/2022, 3:50 PM

## 2022-09-19 NOTE — Progress Notes (Signed)
PROGRESS NOTE    Edward [name]  WHQ:[id number] DOB: 1929/12/08 DOA: 09/17/2022 PCP: Mayra Neer, MD   Brief Narrative: 88 year old, history of permanent A-fib on Eliquis, hypertension, CKD stage IIIa baseline creatinine 1.2--1.4, history of hyponatremia, presented the ER with persistent nausea vomiting that started around 4 PM yesterday.  He vomited multiple times, initially food contents subsequently clear liquid.  He does not last bowel movement. Evaluation in the ED consistent with bilateral lower lobe pneumonia, mild leukocytosis.  Elevated BNP.  Admitted for further treatments. Report cough.    Assessment & Plan:   Principal Problem:   Bilateral pneumonia Active Problems:   Rapid atrial fibrillation (HCC)   Essential hypertension   Stage 3a chronic kidney disease (CKD) (HCC) - baseline SCr 6.5-9.9   Acute diastolic congestive heart failure ([place])   Community acquired pneumonia  1-Bilateral pneumonia: Presented with nausea vomiting, cough, chest x-ray showed bibasilar pulmonary infiltrates. Continue with IV Ceftriaxone, and Doxy. Day 3 Incentive spirometry.  WBC normalized.   2-Nausea vomiting: Could be related to gastroenteritis, food poisoning, or in the setting of PNA.   KUB; nonobstructive bowel gas pattern. Moderate stool burden.  Started  Bowel regimen.  Resolved.  Advanced diet to regular.   3-Acute on Chronic Diastolic HF;  Elevated BNP on admission, sodium down to 127. Cardiology consulted for evaluation.  ECHO with severe Tricuspid Regurgitation, Severely elevated Pulmonary pressure.  Cardiology recommend IV lasix.   A fib RVR He was in rapid A-fib with EMS.  Received IV Cardizem.   Continue with  Lopressor will 75 twice daily.  Echo with Severe TR Mild elevation troponin in setting PNA, A fib.   Essential hypertension: Continue with Lopressor, clonidine.  ARB resume.   Stage IIIa CKD creatinine baseline 1.2--1.4. Stable, Will monitor  Mild  Hyponatremia; monitor on lasix.   Hypomagnesemia; Replaced.   Estimated body mass index is 26.45 kg/m as calculated from the following:   Height as of this encounter: '5\' 7"'$  (1.702 m).   Weight as of this encounter: 76.6 kg.   DVT prophylaxis: Eliquis Code Status: Full code Family Communication: Care discussed with family  Disposition Plan:  Status is: Observation The patient remains OBS appropriate and will d/c before 2 midnights.    Consultants:  none  Procedures:  ECHO  Antimicrobials:  Ceftriaxone, doxy   Subjective: He didn't like breakfast, didn't eat breakfast. Report LE edema.  He denies cough.    Objective: Vitals:   09/19/22 0006 09/19/22 0533 09/19/22 0743 09/19/22 1134  BP: (!) 148/85 (!) 156/92 (!) 159/87   Pulse: 90 75  (!) 105  Resp: (!) 24 (!) 23 18   Temp: 98.3 F (36.8 C) 98.3 F (36.8 C) 98.2 F (36.8 C) 97.8 F (36.6 C)  TempSrc: Oral Oral Oral Oral  SpO2: 100% 99% 98% 97%  Weight:  76.6 kg    Height:        Intake/Output Summary (Last 24 hours) at 09/19/2022 1213 Last data filed at 09/18/2022 1300 Gross per 24 hour  Intake 240 ml  Output 300 ml  Net -60 ml    Filed Weights   09/17/22 1630 09/18/22 0456 09/19/22 0533  Weight: 76.5 kg 76.5 kg 76.6 kg    Examination:  General exam: NAD Respiratory system: CTA Cardiovascular system: S 1, S 2 IRR Gastrointestinal system: BS present, soft, nt Central nervous system:Alert, conversant, follows command Extremities: plus 2 edema    Data Reviewed: I have personally reviewed following labs and imaging studies  CBC: Recent Labs  Lab 09/17/22 0227 09/18/22 0037 09/19/22 0702  WBC 11.8* 9.5 6.3  NEUTROABS 10.9* 7.9*  --   HGB 10.8* 9.5* 10.1*  HCT 32.5* 27.9* 29.4*  MCV 92.6 91.8 91.0  PLT 146* 116* 122*    Basic Metabolic Panel: Recent Labs  Lab 09/17/22 0227 09/18/22 0037 09/19/22 0702  NA 132* 127* 128*  K 4.4 3.8 4.1  CL 95* 92* 96*  CO2 '24 24 25  '$ GLUCOSE  127* 101* 116*  BUN 32* 30* 30*  CREATININE 1.44* 1.41* 1.33*  CALCIUM 8.6* 7.8* 8.1*  MG  --  1.4* 1.9    GFR: Estimated Creatinine Clearance: 34.5 mL/min (A) (by C-G formula based on SCr of 1.33 mg/dL (H)). Liver Function Tests: Recent Labs  Lab 09/17/22 0227 09/18/22 0037  AST 38 33  ALT 37 30  ALKPHOS 109 73  BILITOT 0.8 1.1  PROT 6.6 5.6*  ALBUMIN 3.0* 2.5*    Recent Labs  Lab 09/17/22 0410  LIPASE 27    No results for input(s): "AMMONIA" in the last 168 hours. Coagulation Profile: Recent Labs  Lab 09/17/22 0227  INR 1.6*    Cardiac Enzymes: No results for input(s): "CKTOTAL", "CKMB", "CKMBINDEX", "TROPONINI" in the last 168 hours. BNP (last 3 results) No results for input(s): "PROBNP" in the last 8760 hours. HbA1C: No results for input(s): "HGBA1C" in the last 72 hours. CBG: No results for input(s): "GLUCAP" in the last 168 hours. Lipid Profile: No results for input(s): "CHOL", "HDL", "LDLCALC", "TRIG", "CHOLHDL", "LDLDIRECT" in the last 72 hours. Thyroid Function Tests: No results for input(s): "TSH", "T4TOTAL", "FREET4", "T3FREE", "THYROIDAB" in the last 72 hours. Anemia Panel: No results for input(s): "VITAMINB12", "FOLATE", "FERRITIN", "TIBC", "IRON", "RETICCTPCT" in the last 72 hours. Sepsis Labs: No results for input(s): "PROCALCITON", "LATICACIDVEN" in the last 168 hours.  Recent Results (from the past 240 hour(s))  SARS Coronavirus 2 by RT PCR (hospital order, performed in [facility] hospital lab) *cepheid single result test* Anterior Nasal Swab     Status: None   Collection Time: 09/17/22  3:44 AM   Specimen: Anterior Nasal Swab  Result Value Ref Range Status   SARS Coronavirus 2 by RT PCR NEGATIVE NEGATIVE Final    Comment: (NOTE) SARS-CoV-2 target nucleic acids are NOT DETECTED.  The SARS-CoV-2 RNA is generally detectable in upper and lower respiratory specimens during the acute phase of infection. The lowest concentration of  SARS-CoV-2 viral copies this assay can detect is 250 copies / mL. A negative result does not preclude SARS-CoV-2 infection and should not be used as the sole basis for treatment or other patient management decisions.  A negative result may occur with improper specimen collection / handling, submission of specimen other than nasopharyngeal swab, presence of viral mutation(s) within the areas targeted by this assay, and inadequate number of viral copies (<250 copies / mL). A negative result must be combined with clinical observations, patient history, and epidemiological information.  Fact Sheet for Patients:   https://www.patel.info/  Fact Sheet for Healthcare Providers: https://hall.com/  This test is not yet approved or  cleared by the [place] FDA and has been authorized for detection and/or diagnosis of SARS-CoV-2 by FDA under an Emergency Use Authorization (EUA).  This EUA will remain in effect (meaning this test can be used) for the duration of the COVID-19 declaration under Section 564(b)(1) of the Act, 21 U.S.C. section 360bbb-3(b)(1), unless the authorization is terminated or revoked sooner.  Performed at [facility], [place] [address] [place], [place] [address]          Radiology Studies: ECHOCARDIOGRAM COMPLETE  Result Date: 09/18/2022    ECHOCARDIOGRAM REPORT   Patient Name:   MEKAI WILKINSON [facility] Date of Exam: 09/18/2022 Medical Rec #:  [id number]      Height:       67.0 in Accession #:    [id number]     Weight:       168.7 lb Date of Birth:  1930-10-23     BSA:          1.881 m Patient Age:    59 years       BP:           155/87 mmHg Patient Gender: M              HR:           102 bpm. Exam Location:  Inpatient Procedure: 2D Echo, Cardiac Doppler and Color Doppler Indications:    Atrial Fibrillation I48.91  History:        Patient has prior history of Echocardiogram examinations, most                 recent 10/10/2022.  CAD, Arrythmias:Atrial Fibrillation; Risk                 Factors:Hypertension and Dyslipidemia. Stage 3a chronic kidney                 disease (CKD).  Sonographer:    Ronny Flurry Referring Phys: [address]  1. Compared to previous echo, LVEF and RVEF are unchanged; TR is more severe.  2. Left ventricular ejection fraction, by estimation, is 55 to 60%. The left ventricle has normal function. The left ventricle has no regional wall motion abnormalities. There is mild left ventricular hypertrophy. Left ventricular diastolic parameters are indeterminate.  3. Right ventricular systolic function is low normal. The right ventricular size is mildly enlarged. There is severely elevated pulmonary artery systolic pressure.  4. Left atrial size was moderately dilated.  5. Right atrial size was severely dilated.  6. A small pericardial effusion is present.  7. Mild mitral valve regurgitation.  8. Tricuspid valve regurgitation is severe.  9. The aortic valve is tricuspid. Aortic valve regurgitation is not visualized. Aortic valve sclerosis is present, with no evidence of aortic valve stenosis. 10. The inferior vena cava is dilated in size with <50% respiratory variability, suggesting right atrial pressure of 15 mmHg. FINDINGS  Left Ventricle: Left ventricular ejection fraction, by estimation, is 55 to 60%. The left ventricle has normal function. The left ventricle has no regional wall motion abnormalities. The left ventricular internal cavity size was normal in size. There is  mild left ventricular hypertrophy. Left ventricular diastolic parameters are indeterminate. Right Ventricle: The right ventricular size is mildly enlarged. Right vetricular wall thickness was not assessed. Right ventricular systolic function is low normal. There is severely elevated pulmonary artery systolic pressure. The tricuspid regurgitant velocity is 3.67 m/s, and with an assumed right atrial pressure of 15 mmHg, the estimated  right ventricular systolic pressure is 16.9 mmHg. Left Atrium: Left atrial size was moderately dilated. Right Atrium: Right atrial size was severely dilated. Pericardium: A small pericardial effusion is present. Mitral Valve: There is moderate thickening of the mitral valve leaflet(s). Mild mitral valve regurgitation. MV peak gradient, 4.5 mmHg. The mean mitral valve gradient is 2.0 mmHg. Tricuspid Valve: The tricuspid valve is normal in  structure. Tricuspid valve regurgitation is severe. Aortic Valve: The aortic valve is tricuspid. Aortic valve regurgitation is not visualized. Aortic valve sclerosis is present, with no evidence of aortic valve stenosis. Aortic valve mean gradient measures 3.0 mmHg. Aortic valve peak gradient measures 4.4  mmHg. Aortic valve area, by VTI measures 2.81 cm. Pulmonic Valve: The pulmonic valve was normal in structure. Pulmonic valve regurgitation is mild. Aorta: The aortic root and ascending aorta are structurally normal, with no evidence of dilitation. Venous: The inferior vena cava is dilated in size with less than 50% respiratory variability, suggesting right atrial pressure of 15 mmHg. IAS/Shunts: No atrial level shunt detected by color flow Doppler.  LEFT VENTRICLE PLAX 2D LVIDd:         4.10 cm LVIDs:         3.30 cm LV PW:         1.20 cm LV IVS:        1.10 cm LVOT diam:     2.10 cm LV SV:         51 LV SV Index:   27 LVOT Area:     3.46 cm  RIGHT VENTRICLE            IVC RV S prime:     5.11 cm/s  IVC diam: 2.70 cm TAPSE (M-mode): 1.0 cm LEFT ATRIUM             Index        RIGHT ATRIUM           Index LA diam:        4.60 cm 2.45 cm/m   RA Area:     24.70 cm LA Vol (A2C):   53.3 ml 28.34 ml/m  RA Volume:   75.40 ml  40.09 ml/m LA Vol (A4C):   72.4 ml 38.49 ml/m LA Biplane Vol: 62.2 ml 33.07 ml/m  AORTIC VALVE AV Area (Vmax):    2.41 cm AV Area (Vmean):   2.43 cm AV Area (VTI):     2.81 cm AV Vmax:           105.00 cm/s AV Vmean:          77.300 cm/s AV VTI:             0.182 m AV Peak Grad:      4.4 mmHg AV Mean Grad:      3.0 mmHg LVOT Vmax:         73.20 cm/s LVOT Vmean:        54.200 cm/s LVOT VTI:          0.148 m LVOT/AV VTI ratio: 0.81  AORTA Ao Root diam: 2.80 cm Ao Asc diam:  3.00 cm MITRAL VALVE                TRICUSPID VALVE MV Area VTI:  2.27 cm      TR Peak grad:   53.9 mmHg MV Peak grad: 4.5 mmHg      TR Vmax:        367.00 cm/s MV Mean grad: 2.0 mmHg MV Vmax:      1.06 m/s      SHUNTS MV Vmean:     60.3 cm/s     Systemic VTI:  0.15 m MV E velocity: 105.00 cm/s  Systemic Diam: 2.10 cm Dorris Carnes MD Electronically signed by Dorris Carnes MD Signature Date/Time: 09/18/2022/3:50:31 PM    Final         Scheduled Meds: Continuous  Infusions:   LOS: 1 day    Time spent: 35 minutes    Tyreek Clabo A Yanet Balliet, MD [facility]   If 7PM-7AM, please contact night-coverage [email or web address]  09/19/2022, 12:13 PM

## 2022-09-19 NOTE — TOC Benefit Eligibility Note (Signed)
Patient Teacher, English as a foreign language completed.    The patient is currently admitted and upon discharge could be taking Jardiance 10 mg.  The current 30 day co-pay is $158.66.   The patient is currently admitted and upon discharge could be taking Farxiga 10 mg.  The current 30 day co-pay is $151.18   The patient is insured through [employer] [place] Patient Advocate Specialist [place] Patient Advocate Team Direct Number: [phone]  Fax: [phone]

## 2022-09-20 DIAGNOSIS — I2510 Atherosclerotic heart disease of native coronary artery without angina pectoris: Secondary | ICD-10-CM

## 2022-09-20 DIAGNOSIS — I071 Rheumatic tricuspid insufficiency: Secondary | ICD-10-CM

## 2022-09-20 DIAGNOSIS — I5033 Acute on chronic diastolic (congestive) heart failure: Secondary | ICD-10-CM | POA: Diagnosis not present

## 2022-09-20 DIAGNOSIS — I272 Pulmonary hypertension, unspecified: Secondary | ICD-10-CM | POA: Diagnosis not present

## 2022-09-20 DIAGNOSIS — E871 Hypo-osmolality and hyponatremia: Secondary | ICD-10-CM

## 2022-09-20 DIAGNOSIS — I10 Essential (primary) hypertension: Secondary | ICD-10-CM | POA: Diagnosis not present

## 2022-09-20 DIAGNOSIS — I2583 Coronary atherosclerosis due to lipid rich plaque: Secondary | ICD-10-CM

## 2022-09-20 DIAGNOSIS — R7989 Other specified abnormal findings of blood chemistry: Secondary | ICD-10-CM

## 2022-09-20 LAB — BASIC METABOLIC PANEL
Anion gap: 13 (ref 5–15)
BUN: 31 mg/dL — ABNORMAL HIGH (ref 8–23)
CO2: 27 mmol/L (ref 22–32)
Calcium: 8.1 mg/dL — ABNORMAL LOW (ref 8.9–10.3)
Chloride: 89 mmol/L — ABNORMAL LOW (ref 98–111)
Creatinine, Ser: 1.44 mg/dL — ABNORMAL HIGH (ref 0.61–1.24)
GFR, Estimated: 46 mL/min — ABNORMAL LOW (ref >60)
Glucose, Bld: 104 mg/dL — ABNORMAL HIGH (ref 70–99)
Potassium: 4.4 mmol/L (ref 3.5–5.1)
Sodium: 129 mmol/L — ABNORMAL LOW (ref 135–145)

## 2022-09-20 MED ORDER — METOPROLOL TARTRATE 100 MG PO TABS
100.0000 mg | ORAL_TABLET | Freq: Two times a day (BID) | ORAL | Status: DC
  Administered 2022-09-20 – 2022-09-21 (×2): 100 mg via ORAL
  Filled 2022-09-20 (×2): qty 1

## 2022-09-20 MED ORDER — FUROSEMIDE 10 MG/ML IJ SOLN: 40.0000 mg | Freq: Once | INTRAMUSCULAR | Status: DC

## 2022-09-20 MED ORDER — FUROSEMIDE 10 MG/ML IJ SOLN
40.0000 mg | Freq: Two times a day (BID) | INTRAMUSCULAR | Status: AC
  Administered 2022-09-20 (×2): 40 mg via INTRAVENOUS
  Filled 2022-09-20 (×2): qty 4

## 2022-09-20 MED ORDER — ENSURE ENLIVE PO LIQD: 237.0000 mL | Freq: Three times a day (TID) | ORAL | Status: DC

## 2022-09-20 MED ORDER — ENSURE ENLIVE PO LIQD
237.0000 mL | Freq: Three times a day (TID) | ORAL | Status: DC
  Administered 2022-09-20: 237 mL via ORAL

## 2022-09-20 MED ORDER — METOPROLOL TARTRATE 25 MG PO TABS
25.0000 mg | ORAL_TABLET | Freq: Once | ORAL | Status: AC
  Administered 2022-09-20: 25 mg via ORAL
  Filled 2022-09-20: qty 1

## 2022-09-20 NOTE — Progress Notes (Addendum)
Rounding Note    Patient Name: Edward [name] Date of Encounter: 09/20/2022  [place] Cardiologist: Quay Burow, MD   Subjective   Denies any chest pain or shortness of breath  Inpatient Medications    Scheduled Meds:  apixaban  5 mg Oral BID   aspirin EC  81 mg Oral Daily   atorvastatin  40 mg Oral Daily   bisacodyl  10 mg Rectal Once   cloNIDine  0.2 mg Oral Daily   doxycycline  100 mg Oral Q12H   finasteride  5 mg Oral Daily   irbesartan  150 mg Oral Daily   magnesium oxide  200 mg Oral BID   metoprolol tartrate  75 mg Oral BID   pantoprazole  40 mg Oral Daily   senna-docusate  1 tablet Oral BID   tamsulosin  0.4 mg Oral Daily   Continuous Infusions:  cefTRIAXone (ROCEPHIN)  IV 2 g (09/20/22 0929)   PRN Meds: acetaminophen **OR** acetaminophen, ondansetron **OR** ondansetron (ZOFRAN) IV, mouth rinse   Vital Signs    Vitals:   09/19/22 1930 09/19/22 2130 09/19/22 2132 09/20/22 0411  BP: (!) 140/83  (!) 157/91 (!) 167/97  Pulse: 79 93 91 81  Resp: '17  20 16  '$ Temp: 97.8 F (36.6 C)   98.7 F (37.1 C)  TempSrc: Oral   Oral  SpO2: 97% 95% 96% 96%  Weight:    74.1 kg  Height:        Intake/Output Summary (Last 24 hours) at 09/20/2022 1034 Last data filed at 09/20/2022 0418 Gross per 24 hour  Intake --  Output 1325 ml  Net -1325 ml       09/20/2022    4:11 AM 09/19/2022    5:33 AM 09/18/2022    4:56 AM  Last 3 Weights  Weight (lbs) 163 lb 5.8 oz 168 lb 14.4 oz 168 lb 10.4 oz  Weight (kg) 74.1 kg 76.613 kg 76.5 kg      Telemetry    Atrial fibrillation with controlled ventricular response- Personally Reviewed  ECG    N/a - Personally Reviewed  Physical Exam   VS:  BP (!) 167/97 (BP Location: Left Arm)   Pulse 81   Temp 98.7 F (37.1 C) (Oral)   Resp 16   Ht '5\' 7"'$  (1.702 m)   Wt 74.1 kg   SpO2 96%   BMI 25.59 kg/m  , BMI Body mass index is 25.59 kg/m. GEN: Well nourished, well developed in no acute  distress HEENT: Normal NECK: No JVD; No carotid bruits LYMPHATICS: No lymphadenopathy CARDIAC: Irregularly irregular, no murmurs, rubs, gallops RESPIRATORY:  Clear to auscultation without rales, wheezing or rhonchi  ABDOMEN: Soft, non-tender, non-distended MUSCULOSKELETAL:  1+ BLE edema; No deformity  SKIN: Warm and dry NEUROLOGIC:  Alert and oriented x 3 PSYCHIATRIC:  Normal affect   Labs    High Sensitivity Troponin:   Recent Labs  Lab 09/17/22 0227 09/17/22 0410  TROPONINIHS 17 25*      Chemistry Recent Labs  Lab 09/17/22 0227 09/18/22 0037 09/19/22 0702 09/20/22 0054  NA 132* 127* 128* 129*  K 4.4 3.8 4.1 4.4  CL 95* 92* 96* 89*  CO2 '24 24 25 27  '$ GLUCOSE 127* 101* 116* 104*  BUN 32* 30* 30* 31*  CREATININE 1.44* 1.41* 1.33* 1.44*  CALCIUM 8.6* 7.8* 8.1* 8.1*  MG  --  1.4* 1.9  --   PROT 6.6 5.6*  --   --   ALBUMIN  3.0* 2.5*  --   --   AST 38 33  --   --   ALT 37 30  --   --   ALKPHOS 109 73  --   --   BILITOT 0.8 1.1  --   --   GFRNONAA 46* 47* 51* 46*  ANIONGAP '13 11 7 13     '$ Lipids No results for input(s): "CHOL", "TRIG", "HDL", "LABVLDL", "LDLCALC", "CHOLHDL" in the last 168 hours.  Hematology Recent Labs  Lab 09/17/22 0227 09/18/22 0037 09/19/22 0702  WBC 11.8* 9.5 6.3  RBC 3.51* 3.04* 3.23*  HGB 10.8* 9.5* 10.1*  HCT 32.5* 27.9* 29.4*  MCV 92.6 91.8 91.0  MCH 30.8 31.3 31.3  MCHC 33.2 34.1 34.4  RDW 13.6 13.9 13.4  PLT 146* 116* 122*    Thyroid No results for input(s): "TSH", "FREET4" in the last 168 hours.  BNP Recent Labs  Lab 09/17/22 0227 09/18/22 0037  BNP 712.6* 468.8*     DDimer No results for input(s): "DDIMER" in the last 168 hours.   Radiology    ECHOCARDIOGRAM COMPLETE  Result Date: 09/18/2022    ECHOCARDIOGRAM REPORT   Patient Name:   Edward [name] [facility] Date of Exam: 09/18/2022 Medical Rec #:  [id number]      Height:       67.0 in Accession #:    [id number]     Weight:       168.7 lb Date of Birth:  28-Jul-1930     BSA:           1.881 m Patient Age:    88 years       BP:           155/87 mmHg Patient Gender: M              HR:           102 bpm. Exam Location:  Inpatient Procedure: 2D Echo, Cardiac Doppler and Color Doppler Indications:    Atrial Fibrillation I48.91  History:        Patient has prior history of Echocardiogram examinations, most                 recent 10/10/2022. CAD, Arrythmias:Atrial Fibrillation; Risk                 Factors:Hypertension and Dyslipidemia. Stage 3a chronic kidney                 disease (CKD).  Sonographer:    Ronny Flurry Referring Phys: [place]  1. Compared to previous echo, LVEF and RVEF are unchanged; TR is more severe.  2. Left ventricular ejection fraction, by estimation, is 55 to 60%. The left ventricle has normal function. The left ventricle has no regional wall motion abnormalities. There is mild left ventricular hypertrophy. Left ventricular diastolic parameters are indeterminate.  3. Right ventricular systolic function is low normal. The right ventricular size is mildly enlarged. There is severely elevated pulmonary artery systolic pressure.  4. Left atrial size was moderately dilated.  5. Right atrial size was severely dilated.  6. A small pericardial effusion is present.  7. Mild mitral valve regurgitation.  8. Tricuspid valve regurgitation is severe.  9. The aortic valve is tricuspid. Aortic valve regurgitation is not visualized. Aortic valve sclerosis is present, with no evidence of aortic valve stenosis. 10. The inferior vena cava is dilated in size with <50% respiratory variability, suggesting right atrial pressure of 15 mmHg. FINDINGS  Left Ventricle: Left ventricular ejection fraction, by estimation, is 55 to 60%. The left ventricle has normal function. The left ventricle has no regional wall motion abnormalities. The left ventricular internal cavity size was normal in size. There is  mild left ventricular hypertrophy. Left ventricular diastolic parameters  are indeterminate. Right Ventricle: The right ventricular size is mildly enlarged. Right vetricular wall thickness was not assessed. Right ventricular systolic function is low normal. There is severely elevated pulmonary artery systolic pressure. The tricuspid regurgitant velocity is 3.67 m/s, and with an assumed right atrial pressure of 15 mmHg, the estimated right ventricular systolic pressure is 16.6 mmHg. Left Atrium: Left atrial size was moderately dilated. Right Atrium: Right atrial size was severely dilated. Pericardium: A small pericardial effusion is present. Mitral Valve: There is moderate thickening of the mitral valve leaflet(s). Mild mitral valve regurgitation. MV peak gradient, 4.5 mmHg. The mean mitral valve gradient is 2.0 mmHg. Tricuspid Valve: The tricuspid valve is normal in structure. Tricuspid valve regurgitation is severe. Aortic Valve: The aortic valve is tricuspid. Aortic valve regurgitation is not visualized. Aortic valve sclerosis is present, with no evidence of aortic valve stenosis. Aortic valve mean gradient measures 3.0 mmHg. Aortic valve peak gradient measures 4.4  mmHg. Aortic valve area, by VTI measures 2.81 cm. Pulmonic Valve: The pulmonic valve was normal in structure. Pulmonic valve regurgitation is mild. Aorta: The aortic root and ascending aorta are structurally normal, with no evidence of dilitation. Venous: The inferior vena cava is dilated in size with less than 50% respiratory variability, suggesting right atrial pressure of 15 mmHg. IAS/Shunts: No atrial level shunt detected by color flow Doppler.  LEFT VENTRICLE PLAX 2D LVIDd:         4.10 cm LVIDs:         3.30 cm LV PW:         1.20 cm LV IVS:        1.10 cm LVOT diam:     2.10 cm LV SV:         51 LV SV Index:   27 LVOT Area:     3.46 cm  RIGHT VENTRICLE            IVC RV S prime:     5.11 cm/s  IVC diam: 2.70 cm TAPSE (M-mode): 1.0 cm LEFT ATRIUM             Index        RIGHT ATRIUM           Index LA diam:         4.60 cm 2.45 cm/m   RA Area:     24.70 cm LA Vol (A2C):   53.3 ml 28.34 ml/m  RA Volume:   75.40 ml  40.09 ml/m LA Vol (A4C):   72.4 ml 38.49 ml/m LA Biplane Vol: 62.2 ml 33.07 ml/m  AORTIC VALVE AV Area (Vmax):    2.41 cm AV Area (Vmean):   2.43 cm AV Area (VTI):     2.81 cm AV Vmax:           105.00 cm/s AV Vmean:          77.300 cm/s AV VTI:            0.182 m AV Peak Grad:      4.4 mmHg AV Mean Grad:      3.0 mmHg LVOT Vmax:         73.20 cm/s LVOT Vmean:  54.200 cm/s LVOT VTI:          0.148 m LVOT/AV VTI ratio: 0.81  AORTA Ao Root diam: 2.80 cm Ao Asc diam:  3.00 cm MITRAL VALVE                TRICUSPID VALVE MV Area VTI:  2.27 cm      TR Peak grad:   53.9 mmHg MV Peak grad: 4.5 mmHg      TR Vmax:        367.00 cm/s MV Mean grad: 2.0 mmHg MV Vmax:      1.06 m/s      SHUNTS MV Vmean:     60.3 cm/s     Systemic VTI:  0.15 m MV E velocity: 105.00 cm/s  Systemic Diam: 2.10 cm Dorris Carnes MD Electronically signed by Dorris Carnes MD Signature Date/Time: 09/18/2022/3:50:31 PM    Final     Cardiac Studies   Echo 09/18/22: 1. Compared to previous echo, LVEF and RVEF are unchanged; TR is more  severe.   2. Left ventricular ejection fraction, by estimation, is 55 to 60%. The  left ventricle has normal function. The left ventricle has no regional  wall motion abnormalities. There is mild left ventricular hypertrophy.  Left ventricular diastolic parameters  are indeterminate.   3. Right ventricular systolic function is low normal. The right  ventricular size is mildly enlarged. There is severely elevated pulmonary  artery systolic pressure.   4. Left atrial size was moderately dilated.   5. Right atrial size was severely dilated.   6. A small pericardial effusion is present.   7. Mild mitral valve regurgitation.   8. Tricuspid valve regurgitation is severe.   9. The aortic valve is tricuspid. Aortic valve regurgitation is not  visualized. Aortic valve sclerosis is present, with no evidence  of aortic  valve stenosis.  10. The inferior vena cava is dilated in size with <50% respiratory  variability, suggesting right atrial pressure of 15 mmHg.   Patient Profile     Edward [name] is a 42M with permanent atrial fibrillation, carotid stenosis, subclavian stenosis, hypertension, hyperlipidemia, hyponatremia, HFpEF admitted with pneumonia in the setting of nausea and vomiting.  Cardiology consulted for atrial fibrillation with RVR, elevated troponin and elevated BNP.   Assessment & Plan    # Acute on chronic diastolic heart failure: # Shortess of breath: # Severe pulmonary HTN: # Severe TR:  -BNP elevated to 468 this admission.   -Echo showed grade II diastolic dysfunction, severe TR, RA pressure 15 mmHg.   -Treated with IV Lasix x2 doses  -He put out 1.3 L yesterday and is net -217 cc since admission  -Serum sodium slightly improved from 127-->>129 today.  Serum creatinine stable at 1.44 and potassium 4.4  -Continue with pneumonia management per IM.   -Add SGLT2 inhibitor prior to discharge.   -Unclear why he has severe pulmonary HTN and severe TR.  May all be related to HFpEF and elevated pulmonary pressure.   -He denies smoking history.   -Rule out chronic thromboembolic disease if pressure remains severely elevated after diuresis.  -he still has LE edema on exam so will give Lasix '40mg'$  IV today and add TED hose stockings   # Hypertension:  -BP remains elevated at 167/97 mmHg -ARB was restarted yesterday -Continue irbesartan 150 mg daily, clonidine 0.2 mg daily -Increase Lopressor to 100 mg twice daily for better blood pressure control   # Permanent atrial fibrillation: -Heart rate well  controlled  -Creasing Lopressor to 100 mg twice daily for better blood pressure control  -Continue apixaban 5 mg twice daily   # CAD:  # Elevated troponin:  # hyperlipidemia: -Remote PCI.   -Hs-troponin mildly elevated.  He has no chest pain.  No plan for an ischemic evaluation at  this time. -Continue atorvastatin, ASA 81 mg daily and beta-blocker.  Unclear he needs aspirin in addition to Eliquis but this can be revisited outpatient.      For questions or updates, please contact [place] Please consult [email or web address] for contact info under        Signed, Fransico Him, MD  09/20/2022, 10:34 AM

## 2022-09-20 NOTE — Progress Notes (Signed)
PROGRESS NOTE    Edward [name]  EQA:[id number] DOB: Feb 23, 1930 DOA: 09/17/2022 PCP: Mayra Neer, MD   Brief Narrative: 88 year old, history of permanent A-fib on Eliquis, hypertension, CKD stage IIIa baseline creatinine 1.2--1.4, history of hyponatremia, presented the ER with persistent nausea vomiting that started around 4 PM yesterday.  He vomited multiple times, initially food contents subsequently clear liquid.  He does not last bowel movement. Evaluation in the ED consistent with bilateral lower lobe pneumonia, mild leukocytosis.  Elevated BNP.  Admitted for further treatments. Report cough.    Assessment & Plan:   Principal Problem:   Bilateral pneumonia Active Problems:   Rapid atrial fibrillation (HCC)   Essential hypertension   Stage 3a chronic kidney disease (CKD) (HCC) - baseline SCr 1.2-1.4   Coronary artery disease due to lipid rich plaque   Acute diastolic congestive heart failure ([place])   Community acquired pneumonia   Elevated troponin   Pulmonary HTN (HCC)   Severe tricuspid regurgitation   Primary hypertension  1-Bilateral pneumonia: Presented with nausea vomiting, cough, chest x-ray showed bibasilar pulmonary infiltrates. Continue with IV Ceftriaxone, and Doxy. Day 4 Incentive spirometry.  WBC normalized.   2-Nausea vomiting: Could be related to gastroenteritis, food poisoning, or in the setting of PNA.   KUB; nonobstructive bowel gas pattern. Moderate stool burden.  Started  Bowel regimen.  Resolved.  Tolerated regular diet.   3-Acute on Chronic Diastolic HF;  Elevated BNP on admission, sodium down to 127. Cardiology consulted for evaluation.  ECHO with severe Tricuspid Regurgitation, Severely elevated Pulmonary pressure.  Cardiology recommend IV lasix.  Plan for another 40 mg IV lasix   A fib RVR He was in rapid A-fib with EMS.  Received IV Cardizem.   Continue with  Lopressor will 75 twice daily.  Echo with Severe TR Mild elevation troponin  in setting PNA, A fib.   Essential hypertension: Continue with Lopressor, clonidine.  ARB resume.   Stage IIIa CKD creatinine baseline 1.2--1.4. Stable, Will monitor  Mild Hyponatremia; monitor on lasix.   Hypomagnesemia; Replaced.   Estimated body mass index is 25.59 kg/m as calculated from the following:   Height as of this encounter: '5\' 7"'$  (1.702 m).   Weight as of this encounter: 74.1 kg.   DVT prophylaxis: Eliquis Code Status: Full code Family Communication: Care discussed with daughter 11/17 Disposition Plan:  Status is: Observation The patient remains OBS appropriate and will d/c before 2 midnights.    Consultants:  none  Procedures:  ECHO  Antimicrobials:  Ceftriaxone, doxy   Subjective: He felt sick on his stomach after he ate breakfast.  He would like to go home.     Objective: Vitals:   09/19/22 1930 09/19/22 2130 09/19/22 2132 09/20/22 0411  BP: (!) 140/83  (!) 157/91 (!) 167/97  Pulse: 79 93 91 81  Resp: '17  20 16  '$ Temp: 97.8 F (36.6 C)   98.7 F (37.1 C)  TempSrc: Oral   Oral  SpO2: 97% 95% 96% 96%  Weight:    74.1 kg  Height:        Intake/Output Summary (Last 24 hours) at 09/20/2022 1204 Last data filed at 09/20/2022 0418 Gross per 24 hour  Intake --  Output 1325 ml  Net -1325 ml    Filed Weights   09/18/22 0456 09/19/22 0533 09/20/22 0411  Weight: 76.5 kg 76.6 kg 74.1 kg    Examination:  General exam: NAD Respiratory system: CTA Cardiovascular system: S 1, S 2 IRR Gastrointestinal  system: BS present, soft, nt Central nervous system: Alert, follows command Extremities: trace edema   Data Reviewed: I have personally reviewed following labs and imaging studies  CBC: Recent Labs  Lab 09/17/22 0227 09/18/22 0037 09/19/22 0702  WBC 11.8* 9.5 6.3  NEUTROABS 10.9* 7.9*  --   HGB 10.8* 9.5* 10.1*  HCT 32.5* 27.9* 29.4*  MCV 92.6 91.8 91.0  PLT 146* 116* 122*    Basic Metabolic Panel: Recent Labs  Lab  09/17/22 0227 09/18/22 0037 09/19/22 0702 09/20/22 0054  NA 132* 127* 128* 129*  K 4.4 3.8 4.1 4.4  CL 95* 92* 96* 89*  CO2 '24 24 25 27  '$ GLUCOSE 127* 101* 116* 104*  BUN 32* 30* 30* 31*  CREATININE 1.44* 1.41* 1.33* 1.44*  CALCIUM 8.6* 7.8* 8.1* 8.1*  MG  --  1.4* 1.9  --     GFR: Estimated Creatinine Clearance: 31.9 mL/min (A) (by C-G formula based on SCr of 1.44 mg/dL (H)). Liver Function Tests: Recent Labs  Lab 09/17/22 0227 09/18/22 0037  AST 38 33  ALT 37 30  ALKPHOS 109 73  BILITOT 0.8 1.1  PROT 6.6 5.6*  ALBUMIN 3.0* 2.5*    Recent Labs  Lab 09/17/22 0410  LIPASE 27    No results for input(s): "AMMONIA" in the last 168 hours. Coagulation Profile: Recent Labs  Lab 09/17/22 0227  INR 1.6*    Cardiac Enzymes: No results for input(s): "CKTOTAL", "CKMB", "CKMBINDEX", "TROPONINI" in the last 168 hours. BNP (last 3 results) No results for input(s): "PROBNP" in the last 8760 hours. HbA1C: No results for input(s): "HGBA1C" in the last 72 hours. CBG: No results for input(s): "GLUCAP" in the last 168 hours. Lipid Profile: No results for input(s): "CHOL", "HDL", "LDLCALC", "TRIG", "CHOLHDL", "LDLDIRECT" in the last 72 hours. Thyroid Function Tests: No results for input(s): "TSH", "T4TOTAL", "FREET4", "T3FREE", "THYROIDAB" in the last 72 hours. Anemia Panel: No results for input(s): "VITAMINB12", "FOLATE", "FERRITIN", "TIBC", "IRON", "RETICCTPCT" in the last 72 hours. Sepsis Labs: No results for input(s): "PROCALCITON", "LATICACIDVEN" in the last 168 hours.  Recent Results (from the past 240 hour(s))  SARS Coronavirus 2 by RT PCR (hospital order, performed in [facility] hospital lab) *cepheid single result test* Anterior Nasal Swab     Status: None   Collection Time: 09/17/22  3:44 AM   Specimen: Anterior Nasal Swab  Result Value Ref Range Status   SARS Coronavirus 2 by RT PCR NEGATIVE NEGATIVE Final    Comment: (NOTE) SARS-CoV-2 target nucleic acids  are NOT DETECTED.  The SARS-CoV-2 RNA is generally detectable in upper and lower respiratory specimens during the acute phase of infection. The lowest concentration of SARS-CoV-2 viral copies this assay can detect is 250 copies / mL. A negative result does not preclude SARS-CoV-2 infection and should not be used as the sole basis for treatment or other patient management decisions.  A negative result may occur with improper specimen collection / handling, submission of specimen other than nasopharyngeal swab, presence of viral mutation(s) within the areas targeted by this assay, and inadequate number of viral copies (<250 copies / mL). A negative result must be combined with clinical observations, patient history, and epidemiological information.  Fact Sheet for Patients:   https://www.patel.info/  Fact Sheet for Healthcare Providers: https://hall.com/  This test is not yet approved or  cleared by the [place] FDA and has been authorized for detection and/or diagnosis of SARS-CoV-2 by FDA under an Emergency Use Authorization (EUA).  This  EUA will remain in effect (meaning this test can be used) for the duration of the COVID-19 declaration under Section 564(b)(1) of the Act, 21 U.S.C. section 360bbb-3(b)(1), unless the authorization is terminated or revoked sooner.  Performed at [facility], [place] [address], [place], [place] [address]          Radiology Studies: ECHOCARDIOGRAM COMPLETE  Result Date: 09/18/2022    ECHOCARDIOGRAM REPORT   Patient Name:   Edward [name] [facility] Date of Exam: 09/18/2022 Medical Rec #:  [id number]      Height:       67.0 in Accession #:    [id number]     Weight:       168.7 lb Date of Birth:  December 30, 1929     BSA:          1.881 m Patient Age:    79 years       BP:           155/87 mmHg Patient Gender: M              HR:           102 bpm. Exam Location:  Inpatient Procedure: 2D Echo, Cardiac Doppler and  Color Doppler Indications:    Atrial Fibrillation I48.91  History:        Patient has prior history of Echocardiogram examinations, most                 recent 10/10/2022. CAD, Arrythmias:Atrial Fibrillation; Risk                 Factors:Hypertension and Dyslipidemia. Stage 3a chronic kidney                 disease (CKD).  Sonographer:    Ronny Flurry Referring Phys: [place]  1. Compared to previous echo, LVEF and RVEF are unchanged; TR is more severe.  2. Left ventricular ejection fraction, by estimation, is 55 to 60%. The left ventricle has normal function. The left ventricle has no regional wall motion abnormalities. There is mild left ventricular hypertrophy. Left ventricular diastolic parameters are indeterminate.  3. Right ventricular systolic function is low normal. The right ventricular size is mildly enlarged. There is severely elevated pulmonary artery systolic pressure.  4. Left atrial size was moderately dilated.  5. Right atrial size was severely dilated.  6. A small pericardial effusion is present.  7. Mild mitral valve regurgitation.  8. Tricuspid valve regurgitation is severe.  9. The aortic valve is tricuspid. Aortic valve regurgitation is not visualized. Aortic valve sclerosis is present, with no evidence of aortic valve stenosis. 10. The inferior vena cava is dilated in size with <50% respiratory variability, suggesting right atrial pressure of 15 mmHg. FINDINGS  Left Ventricle: Left ventricular ejection fraction, by estimation, is 55 to 60%. The left ventricle has normal function. The left ventricle has no regional wall motion abnormalities. The left ventricular internal cavity size was normal in size. There is  mild left ventricular hypertrophy. Left ventricular diastolic parameters are indeterminate. Right Ventricle: The right ventricular size is mildly enlarged. Right vetricular wall thickness was not assessed. Right ventricular systolic function is low normal. There is  severely elevated pulmonary artery systolic pressure. The tricuspid regurgitant velocity is 3.67 m/s, and with an assumed right atrial pressure of 15 mmHg, the estimated right ventricular systolic pressure is 21.3 mmHg. Left Atrium: Left atrial size was moderately dilated. Right Atrium: Right atrial size was severely dilated. Pericardium: A  small pericardial effusion is present. Mitral Valve: There is moderate thickening of the mitral valve leaflet(s). Mild mitral valve regurgitation. MV peak gradient, 4.5 mmHg. The mean mitral valve gradient is 2.0 mmHg. Tricuspid Valve: The tricuspid valve is normal in structure. Tricuspid valve regurgitation is severe. Aortic Valve: The aortic valve is tricuspid. Aortic valve regurgitation is not visualized. Aortic valve sclerosis is present, with no evidence of aortic valve stenosis. Aortic valve mean gradient measures 3.0 mmHg. Aortic valve peak gradient measures 4.4  mmHg. Aortic valve area, by VTI measures 2.81 cm. Pulmonic Valve: The pulmonic valve was normal in structure. Pulmonic valve regurgitation is mild. Aorta: The aortic root and ascending aorta are structurally normal, with no evidence of dilitation. Venous: The inferior vena cava is dilated in size with less than 50% respiratory variability, suggesting right atrial pressure of 15 mmHg. IAS/Shunts: No atrial level shunt detected by color flow Doppler.  LEFT VENTRICLE PLAX 2D LVIDd:         4.10 cm LVIDs:         3.30 cm LV PW:         1.20 cm LV IVS:        1.10 cm LVOT diam:     2.10 cm LV SV:         51 LV SV Index:   27 LVOT Area:     3.46 cm  RIGHT VENTRICLE            IVC RV S prime:     5.11 cm/s  IVC diam: 2.70 cm TAPSE (M-mode): 1.0 cm LEFT ATRIUM             Index        RIGHT ATRIUM           Index LA diam:        4.60 cm 2.45 cm/m   RA Area:     24.70 cm LA Vol (A2C):   53.3 ml 28.34 ml/m  RA Volume:   75.40 ml  40.09 ml/m LA Vol (A4C):   72.4 ml 38.49 ml/m LA Biplane Vol: 62.2 ml 33.07 ml/m   AORTIC VALVE AV Area (Vmax):    2.41 cm AV Area (Vmean):   2.43 cm AV Area (VTI):     2.81 cm AV Vmax:           105.00 cm/s AV Vmean:          77.300 cm/s AV VTI:            0.182 m AV Peak Grad:      4.4 mmHg AV Mean Grad:      3.0 mmHg LVOT Vmax:         73.20 cm/s LVOT Vmean:        54.200 cm/s LVOT VTI:          0.148 m LVOT/AV VTI ratio: 0.81  AORTA Ao Root diam: 2.80 cm Ao Asc diam:  3.00 cm MITRAL VALVE                TRICUSPID VALVE MV Area VTI:  2.27 cm      TR Peak grad:   53.9 mmHg MV Peak grad: 4.5 mmHg      TR Vmax:        367.00 cm/s MV Mean grad: 2.0 mmHg MV Vmax:      1.06 m/s      SHUNTS MV Vmean:     60.3 cm/s     Systemic VTI:  0.15 m MV E velocity: 105.00 cm/s  Systemic Diam: 2.10 cm Dorris Carnes MD Electronically signed by Dorris Carnes MD Signature Date/Time: 09/18/2022/3:50:31 PM    Final         Scheduled Meds: Continuous Infusions:   LOS: 2 days    Time spent: 35 minutes    Catlynn Grondahl A Iesha Summerhill, MD [facility]   If 7PM-7AM, please contact night-coverage [email or web address]  09/20/2022, 12:04 PM

## 2022-09-20 NOTE — Progress Notes (Signed)
   09/20/22 1000  Mobility  Activity Ambulated with assistance in hallway  Level of Assistance Contact guard assist, steadying assist  Assistive Device None  Distance Ambulated (ft) 300 ft  Activity Response Tolerated well  Mobility Referral Yes  $Mobility charge 1 Mobility   Mobility Specialist Progress Note  Received pt EOB having no complaints and agreeable to mobility. Pt was asymptomatic throughout ambulation and returned to room w/o fault. Left EOB w/ call bell in reach and all needs met.   Edward [name] Mobility Specialist

## 2022-09-21 ENCOUNTER — Other Ambulatory Visit: Payer: Self-pay | Admitting: Student

## 2022-09-21 DIAGNOSIS — I5031 Acute diastolic (congestive) heart failure: Secondary | ICD-10-CM

## 2022-09-21 DIAGNOSIS — J189 Pneumonia, unspecified organism: Secondary | ICD-10-CM | POA: Diagnosis not present

## 2022-09-21 DIAGNOSIS — N1831 Chronic kidney disease, stage 3a: Secondary | ICD-10-CM

## 2022-09-21 LAB — BASIC METABOLIC PANEL
Anion gap: 14 (ref 5–15)
BUN: 30 mg/dL — ABNORMAL HIGH (ref 8–23)
CO2: 28 mmol/L (ref 22–32)
Calcium: 8.4 mg/dL — ABNORMAL LOW (ref 8.9–10.3)
Chloride: 85 mmol/L — ABNORMAL LOW (ref 98–111)
Creatinine, Ser: 1.47 mg/dL — ABNORMAL HIGH (ref 0.61–1.24)
GFR, Estimated: 45 mL/min — ABNORMAL LOW (ref >60)
Glucose, Bld: 127 mg/dL — ABNORMAL HIGH (ref 70–99)
Potassium: 3.4 mmol/L — ABNORMAL LOW (ref 3.5–5.1)
Sodium: 127 mmol/L — ABNORMAL LOW (ref 135–145)

## 2022-09-21 MED ORDER — PANTOPRAZOLE SODIUM 40 MG PO TBEC: 40.0000 mg | DELAYED_RELEASE_TABLET | Freq: Two times a day (BID) | ORAL | 0 refills | Status: AC

## 2022-09-21 MED ORDER — PANTOPRAZOLE SODIUM 40 MG PO TBEC: 40.0000 mg | DELAYED_RELEASE_TABLET | Freq: Two times a day (BID) | ORAL | Status: DC

## 2022-09-21 MED ORDER — CEFDINIR 300 MG PO CAPS: 300.0000 mg | ORAL_CAPSULE | Freq: Two times a day (BID) | ORAL | 0 refills | Status: AC

## 2022-09-21 MED ORDER — DOXYCYCLINE HYCLATE 100 MG PO TABS: 100.0000 mg | ORAL_TABLET | Freq: Two times a day (BID) | ORAL | 0 refills | Status: AC

## 2022-09-21 MED ORDER — FUROSEMIDE 20 MG PO TABS: 20.0000 mg | ORAL_TABLET | Freq: Every day | ORAL | 1 refills | Status: DC

## 2022-09-21 MED ORDER — SALINE SPRAY 0.65 % NA SOLN
1.0000 | NASAL | Status: DC | PRN
  Administered 2022-09-21: 1 via NASAL
  Filled 2022-09-21: qty 44

## 2022-09-21 MED ORDER — POTASSIUM CHLORIDE CRYS ER 20 MEQ PO TBCR: 20.0000 meq | EXTENDED_RELEASE_TABLET | Freq: Every day | ORAL | 0 refills | Status: DC

## 2022-09-21 MED ORDER — METOPROLOL TARTRATE 100 MG PO TABS: 100.0000 mg | ORAL_TABLET | Freq: Two times a day (BID) | ORAL | 3 refills | Status: DC

## 2022-09-21 MED ORDER — POTASSIUM CHLORIDE CRYS ER 20 MEQ PO TBCR
40.0000 meq | EXTENDED_RELEASE_TABLET | Freq: Once | ORAL | Status: AC
  Administered 2022-09-21: 40 meq via ORAL

## 2022-09-21 MED ORDER — IPRATROPIUM BROMIDE 0.06 % NA SOLN: 2.0000 | Freq: Four times a day (QID) | NASAL | Status: DC | PRN

## 2022-09-21 NOTE — Progress Notes (Signed)
Ordered repeat BMET for 09/24/2022 at the request of Dr. Radford Pax. Please see her rounding note from today for more information.  Darreld Mclean, PA-C 09/21/2022 10:26 AM

## 2022-09-21 NOTE — Progress Notes (Signed)
   09/21/22 1200  Mobility  Activity Ambulated with assistance in hallway  Level of Assistance Contact guard assist, steadying assist  Assistive Device None  Distance Ambulated (ft) 180 ft  Activity Response Tolerated well  Mobility Referral Yes  $Mobility charge 1 Mobility   Mobility Specialist Progress Note  Pre-Mobility: 120/78 BP  Pt was in bed and agreeable. Had no c/o pain throughout ambulation. Returned to bed w/ all needs and met and call bell in reach.   Lucious Groves Mobility Specialist

## 2022-09-21 NOTE — Evaluation (Signed)
Clinical/Bedside Swallow Evaluation Patient Details  Name: Edward [name] MRN: [id number] Date of Birth: Aug 12, 1930  Today's Date: 09/21/2022 Time: SLP Start Time (ACUTE ONLY): 1200 SLP Stop Time (ACUTE ONLY): 1232 SLP Time Calculation (min) (ACUTE ONLY): 32 min  Past Medical History:  Past Medical History:  Diagnosis Date   Atherosclerosis of abdominal aorta ([place])    CT/abd and pelvis   Carotid artery occlusion    bilateral carotid bruit, right greater then left -bilateral 40-50% stenosis, 12/26/09- no change   Chronic anxiety    Coronary artery disease    s/p BM stent, prox and mid RCA, 2001, cutting ballon RCA stenosis, 2002   Dysphagia    from esophageal dysmotility-tx with careful eating.    History of echocardiogram    2/11 echo EF 70%, mild MR, mildly elevated pulmonary pressures 42 mmHg   History of renal angiogram    9/10, showed patent renal stents, 30-40% instent restenosis ws the most severe lesion   Hyperlipidemia    Left kidney mass    lower pole, observing by urology- Dr. Reece Agar   Peripheral neuropathy    in both feet from nerve compression    Renovascular hypertension    s./p. bilateral RA stent implant   Subclavian artery stenosis [facility])    Past Surgical History:  Past Surgical History:  Procedure Laterality Date   CARDIAC CATHETERIZATION  2001   BM stent, prox and mid RCA   cataract surgery Bilateral 04/14   COLONOSCOPY     every 10 years   left foot surgery     RENAL ARTERY STENT  08/07   HPI:  Pt is a 88 yo male presenting 11/15 with persistent N/V. Pt noted to be in afib, CXR demonstrates bilateral PNA.PMH includes esophageal dysmotility (per esophagram 2014), afib, HTN, CKD, HLD.    Assessment / Plan / Recommendation  Clinical Impression  Pt has a h/o esophageal dysphagia that is chronic in nature, and he can verbalize the precautions he needs to take. He does however say that he has been lying down after meals because of the swelling in his  ankles, but we discussed ways to problem solve still keeping him more upright. Pt says that prior to the initiation of his vomiting, he had gone out for his birthday and had felt like he had over indulged and over filled more than what he could handle, but that this is a rare occurance and he has never had PNA before. Pt ate his whole meal tray with no overt signs to suggest an oropharyngeal dysphagia. At the very end, he started to drink some more thin liquids, at which time he had an episode of coughing that was more concerning for possible decreased airway protection, but he identified this in the moment and coughed to try to clear it. He consumed more thin liquids via cup and straw with no further coughing that could be elicited. Pt reports that this occurance is also very rare. Given all of the above, suspect that his risk for aspiration might be highest post-prandially in the estting of esophageal dysmotility (and recent vomiting). SLP did reinforce aspiration and esophageal precautions and educated him about signs for which to monitor at home. Would leave on regular solids and thin liquids for now with use of these precautions. SLP Visit Diagnosis: Dysphagia, unspecified (R13.10)    Aspiration Risk  Mild aspiration risk    Diet Recommendation Regular;Thin liquid   Liquid Administration via: Cup;Straw Medication Administration: Whole meds with puree (  crush larger pills) Supervision: Patient able to self feed;Intermittent supervision to cue for compensatory strategies Compensations: Slow rate;Small sips/bites;Follow solids with liquid Postural Changes: Seated upright at 90 degrees;Remain upright for at least 30 minutes after po intake    Other  Recommendations Oral Care Recommendations: Oral care BID    Recommendations for follow up therapy are one component of a multi-disciplinary discharge planning process, led by the attending physician.  Recommendations may be updated based on patient  status, additional functional criteria and insurance authorization.  Follow up Recommendations No SLP follow up      Assistance Recommended at Discharge    Functional Status Assessment Patient has had a recent decline in their functional status and demonstrates the ability to make significant improvements in function in a reasonable and predictable amount of time.  Frequency and Duration min 1 x/week  1 week       Prognosis Prognosis for Safe Diet Advancement: Good Barriers to Reach Goals: Time post onset      Swallow Study   General HPI: Pt is a 88 yo male presenting 11/15 with persistent N/V. Pt noted to be in afib, CXR demonstrates bilateral PNA.PMH includes esophageal dysmotility (per esophagram 2014), afib, HTN, CKD, HLD. Type of Study: Bedside Swallow Evaluation Previous Swallow Assessment: none in chart from SLP - see HPI re: esophagram Diet Prior to this Study: Regular;Thin liquids Temperature Spikes Noted: No Respiratory Status: Room air History of Recent Intubation: No Behavior/Cognition: Alert;Cooperative;Pleasant mood Oral Cavity Assessment: Within Functional Limits Oral Care Completed by SLP: No Oral Cavity - Dentition: Adequate natural dentition Vision: Functional for self-feeding Self-Feeding Abilities: Able to feed self Patient Positioning: Other (comment) (EOB) Baseline Vocal Quality: Normal Volitional Cough: Strong Volitional Swallow: Able to elicit    Oral/Motor/Sensory Function Overall Oral Motor/Sensory Function: Within functional limits   Ice Chips Ice chips: Not tested   Thin Liquid Thin Liquid: Impaired Presentation: Cup;Self Fed;Straw;Spoon Pharyngeal  Phase Impairments: Cough - Immediate (x1)    Nectar Thick Nectar Thick Liquid: Not tested   Honey Thick Honey Thick Liquid: Not tested   Puree Puree: Not tested   Solid     Solid: Within functional limits Presentation: Self Fed      Osie Bond., M.A. [place] Office [phone]  Secure chat preferred  09/21/2022,1:05 PM

## 2022-09-21 NOTE — Progress Notes (Signed)
Pt discharged home with family.  And instructions and medications reviewed, all questions answered.  Follow up appts in place

## 2022-09-21 NOTE — Discharge Summary (Signed)
Physician Discharge Summary   Patient: Edward [name] MRN: [id number] DOB: 02-25-1930  Admit date:     09/17/2022  Discharge date: 09/21/22  Discharge Physician: Elmarie Shiley   PCP: Mayra Neer, MD   Recommendations at discharge:   Needs B-met on 11/22 to follow Sodium level.  Needs to follow up with Cardiology for further evaluation of Tricuspid Valve regurgitation and Pulmonary HTN. Will need repeat ECHO.  Consider referral to GI for esophageal dysmotility Disorder.   Discharge Diagnoses: Principal Problem:   Bilateral pneumonia Active Problems:   Rapid atrial fibrillation (HCC)   Essential hypertension   Stage 3a chronic kidney disease (CKD) (HCC) - baseline SCr 1.2-1.4   Coronary artery disease due to lipid rich plaque   Acute diastolic congestive heart failure ([place])   Community acquired pneumonia   Elevated troponin   Pulmonary HTN (HCC)   Severe tricuspid regurgitation   Primary hypertension  Resolved Problems:   * No resolved hospital problems. [employer] [facility] Course: 88 year old, history of permanent A-fib on Eliquis, hypertension, CKD stage IIIa baseline creatinine 1.2--1.4, history of hyponatremia, presented the ER with persistent nausea vomiting that started around 4 PM yesterday.  He vomited multiple times, initially food contents subsequently clear liquid.  He does not last bowel movement. Evaluation in the ED consistent with bilateral lower lobe pneumonia, mild leukocytosis.  Elevated BNP.  Admitted for further treatments. Report cough.   Assessment and Plan: 1-Bilateral pneumonia: Presented with nausea vomiting, cough, chest x-ray showed bibasilar pulmonary infiltrates. Continue with IV Ceftriaxone, and Doxy. Day 5. He will be discharge on one more day of Doxy and Cefdinir for 2 days.  Incentive spirometry.  WBC normalized.    2-Nausea vomiting: Could be related to gastroenteritis, food poisoning, or in the setting of PNA.   KUB; nonobstructive bowel  gas pattern. Moderate stool burden.  Started  Bowel regimen.  Tolerated regular diet.  Resolved.  He report episode of congestion upper airway while eating. Evaluated by speech no sign of oropharyngeal aspiration. He should follow reflux precaution, and esophageal dysmotility disorder precaution. Do not lying down after eating, avoid eating 2 hours before going to bed time. I have change PPI to BID. Needs follow up with GI.   3-Acute on Chronic Diastolic HF;  Elevated BNP on admission, sodium down to 127. Cardiology consulted for evaluation.  ECHO with severe Tricuspid Regurgitation, Severely elevated Pulmonary pressure.  Cardiology recommend IV lasix.  Treated with IV lasix 40 mg IV BID.  Plan to discharge on 20 mg daily lasix and potassium supplement. Needs Bmet on 11/22 to follow sodium level.     A fib RVR He was in rapid A-fib with EMS.  Received IV Cardizem.   Continue with  Lopressor, which was increase to 100 mg BID. Echo with Severe TR Mild elevation troponin in setting PNA, A fib.    Essential hypertension: Continue with Lopressor, clonidine.  ARB resume.    Stage IIIa CKD creatinine baseline 1.2--1.4. Relatively stable.    Hyponatremia; Initially thought to be related to hypovolemia due to vomiting episodes at home. Sodium improved with oral intake.  There was also some component of hyponatremia related to HF.  Sodium down to 127 after two doses of lasix on 11/18. Plan to discharge on 20 mg lasix, to start 11/20. -Close B-met follow up on 11/22 with cardiology.  -of note last Sodium level on 11/2021 was at 129.    Hypomagnesemia; Replaced.  Hypokalemia; Replete orally.   Estimated body  mass index is 25.59 kg/m as calculated from the following:   Height as of this encounter: _0  (1.702 m).   Weight as of this encounter: 74.1 kg.           Consultants: Cardiology  Procedures performed: ECHO Disposition: Home Diet recommendation:  Discharge Diet Orders (From  admission, onward)     Start     Ordered   09/21/22 0000  Diet general        09/21/22 1239           Regular diet DISCHARGE MEDICATION: Allergies as of 09/21/2022       Reactions   Amlodipine Other (See Comments)   Tolerates 2.5 mg, higher doses cause LEE with blistering   Erythromycin    Penicillins    Prednisone         Medication List     STOP taking these medications    omeprazole 20 MG capsule Commonly known as: PRILOSEC Replaced by: pantoprazole 40 MG tablet       TAKE these medications    apixaban 5 MG Tabs tablet Commonly known as: ELIQUIS Take 1 tablet (5 mg total) by mouth 2 (two) times daily.   aspirin EC 81 MG tablet Take 1 tablet (81 mg total) by mouth daily.   atorvastatin 40 MG tablet Commonly known as: LIPITOR Take 1 tablet (40 mg total) by mouth daily.   cefdinir 300 MG capsule Commonly known as: OMNICEF Take 1 capsule (300 mg total) by mouth 2 (two) times daily for 2 days.   CITRACAL PETITES/VITAMIN D PO Take 1 tablet by mouth every evening.   cloNIDine 0.1 MG tablet Commonly known as: CATAPRES Take 2 tablets (0.2 mg total) by mouth daily.   Co Q-10 100 MG Caps Take 100 mg by mouth daily.   doxycycline 100 MG tablet Commonly known as: VIBRA-TABS Take 1 tablet (100 mg total) by mouth every 12 (twelve) hours for 2 doses.   finasteride 5 MG tablet Commonly known as: PROSCAR Take 5 mg by mouth daily.   Fish Oil 1000 MG Caps Take 1,200 mg by mouth in the morning and at bedtime.   folic acid 631 MCG tablet Commonly known as: FOLVITE Take 400 mcg by mouth daily.   furosemide 20 MG tablet Commonly known as: Lasix Take 1 tablet (20 mg total) by mouth daily.   LORazepam 1 MG tablet Commonly known as: ATIVAN Take 1 mg by mouth in the morning and at bedtime.   metoprolol tartrate 100 MG tablet Commonly known as: LOPRESSOR Take 1 tablet (100 mg total) by mouth 2 (two) times daily. What changed:  medication strength how  much to take   pantoprazole 40 MG tablet Commonly known as: PROTONIX Take 1 tablet (40 mg total) by mouth 2 (two) times daily. Replaces: omeprazole 20 MG capsule   Polyethyl Glycol-Propyl Glycol 0.4-0.3 % Soln Apply 1 drop to eye at bedtime as needed (dryness).   potassium chloride SA 20 MEQ tablet Commonly known as: KLOR-CON M Take 1 tablet (20 mEq total) by mouth daily.   PreserVision AREDS 2 Caps Take by mouth.   tamsulosin 0.4 MG Caps capsule Commonly known as: FLOMAX Take 0.4 mg by mouth daily.   valsartan 320 MG tablet Commonly known as: DIOVAN TAKE 1/2 TABLET BY MOUTH 2 TIMES DAILY. What changed:  how much to take how to take this when to take this additional instructions        Follow-up Information  Lenna Sciara, NP Follow up.   Specialties: Nurse Practitioner, Family Medicine Why: Hospital follow-up with Cardiology scheduled for 10/01/2022 at 10:55am. Please arrive 15 minutes early for check-in. If this date/time does not work for you, please call our office to reschedule. Contact information: [address] [place]  [place] [address] [phone]         [facility] [address] [facility] [place] Follow up.   Specialty: Cardiology Why: Please come by our office on Wednesday 09/24/2022 for repeat lab work (BMET). You can come by anytime from 8am to 4:30pm (lab is usually closed around 1-2pm for lunch). You do not need an appointment and you do not need to be fasting for these labs. Contact information: [place] [id number] [place] [address] [phone]               Discharge Exam: Danley Danker Weights   09/19/22 0533 09/20/22 0411 09/21/22 0005  Weight: 76.6 kg 74.1 kg 72.4 kg   General; NAD  Condition at discharge: stable  The results of significant diagnostics from this hospitalization (including imaging, microbiology, ancillary and laboratory) are listed below  for reference.   Imaging Studies: ECHOCARDIOGRAM COMPLETE  Result Date: 09/18/2022    ECHOCARDIOGRAM REPORT   Patient Name:   LORIS WINROW [facility] Date of Exam: 09/18/2022 Medical Rec #:  [id number]      Height:       67.0 in Accession #:    [id number]     Weight:       168.7 lb Date of Birth:  1930-02-26     BSA:          1.881 m Patient Age:    43 years       BP:           155/87 mmHg Patient Gender: M              HR:           102 bpm. Exam Location:  Inpatient Procedure: 2D Echo, Cardiac Doppler and Color Doppler Indications:    Atrial Fibrillation I48.91  History:        Patient has prior history of Echocardiogram examinations, most                 recent 10/10/2022. CAD, Arrythmias:Atrial Fibrillation; Risk                 Factors:Hypertension and Dyslipidemia. Stage 3a chronic kidney                 disease (CKD).  Sonographer:    Ronny Flurry Referring Phys: [place]  1. Compared to previous echo, LVEF and RVEF are unchanged; TR is more severe.  2. Left ventricular ejection fraction, by estimation, is 55 to 60%. The left ventricle has normal function. The left ventricle has no regional wall motion abnormalities. There is mild left ventricular hypertrophy. Left ventricular diastolic parameters are indeterminate.  3. Right ventricular systolic function is low normal. The right ventricular size is mildly enlarged. There is severely elevated pulmonary artery systolic pressure.  4. Left atrial size was moderately dilated.  5. Right atrial size was severely dilated.  6. A small pericardial effusion is present.  7. Mild mitral valve regurgitation.  8. Tricuspid valve regurgitation is severe.  9. The aortic valve is tricuspid. Aortic valve regurgitation is not visualized. Aortic valve sclerosis is present, with no evidence of aortic valve  stenosis. 10. The inferior vena cava is dilated in size with <50% respiratory variability, suggesting right atrial pressure of 15 mmHg. FINDINGS  Left  Ventricle: Left ventricular ejection fraction, by estimation, is 55 to 60%. The left ventricle has normal function. The left ventricle has no regional wall motion abnormalities. The left ventricular internal cavity size was normal in size. There is  mild left ventricular hypertrophy. Left ventricular diastolic parameters are indeterminate. Right Ventricle: The right ventricular size is mildly enlarged. Right vetricular wall thickness was not assessed. Right ventricular systolic function is low normal. There is severely elevated pulmonary artery systolic pressure. The tricuspid regurgitant velocity is 3.67 m/s, and with an assumed right atrial pressure of 15 mmHg, the estimated right ventricular systolic pressure is 74.1 mmHg. Left Atrium: Left atrial size was moderately dilated. Right Atrium: Right atrial size was severely dilated. Pericardium: A small pericardial effusion is present. Mitral Valve: There is moderate thickening of the mitral valve leaflet(s). Mild mitral valve regurgitation. MV peak gradient, 4.5 mmHg. The mean mitral valve gradient is 2.0 mmHg. Tricuspid Valve: The tricuspid valve is normal in structure. Tricuspid valve regurgitation is severe. Aortic Valve: The aortic valve is tricuspid. Aortic valve regurgitation is not visualized. Aortic valve sclerosis is present, with no evidence of aortic valve stenosis. Aortic valve mean gradient measures 3.0 mmHg. Aortic valve peak gradient measures 4.4  mmHg. Aortic valve area, by VTI measures 2.81 cm. Pulmonic Valve: The pulmonic valve was normal in structure. Pulmonic valve regurgitation is mild. Aorta: The aortic root and ascending aorta are structurally normal, with no evidence of dilitation. Venous: The inferior vena cava is dilated in size with less than 50% respiratory variability, suggesting right atrial pressure of 15 mmHg. IAS/Shunts: No atrial level shunt detected by color flow Doppler.  LEFT VENTRICLE PLAX 2D LVIDd:         4.10 cm LVIDs:          3.30 cm LV PW:         1.20 cm LV IVS:        1.10 cm LVOT diam:     2.10 cm LV SV:         51 LV SV Index:   27 LVOT Area:     3.46 cm  RIGHT VENTRICLE            IVC RV S prime:     5.11 cm/s  IVC diam: 2.70 cm TAPSE (M-mode): 1.0 cm LEFT ATRIUM             Index        RIGHT ATRIUM           Index LA diam:        4.60 cm 2.45 cm/m   RA Area:     24.70 cm LA Vol (A2C):   53.3 ml 28.34 ml/m  RA Volume:   75.40 ml  40.09 ml/m LA Vol (A4C):   72.4 ml 38.49 ml/m LA Biplane Vol: 62.2 ml 33.07 ml/m  AORTIC VALVE AV Area (Vmax):    2.41 cm AV Area (Vmean):   2.43 cm AV Area (VTI):     2.81 cm AV Vmax:           105.00 cm/s AV Vmean:          77.300 cm/s AV VTI:            0.182 m AV Peak Grad:      4.4 mmHg AV Mean Grad:  3.0 mmHg LVOT Vmax:         73.20 cm/s LVOT Vmean:        54.200 cm/s LVOT VTI:          0.148 m LVOT/AV VTI ratio: 0.81  AORTA Ao Root diam: 2.80 cm Ao Asc diam:  3.00 cm MITRAL VALVE                TRICUSPID VALVE MV Area VTI:  2.27 cm      TR Peak grad:   53.9 mmHg MV Peak grad: 4.5 mmHg      TR Vmax:        367.00 cm/s MV Mean grad: 2.0 mmHg MV Vmax:      1.06 m/s      SHUNTS MV Vmean:     60.3 cm/s     Systemic VTI:  0.15 m MV E velocity: 105.00 cm/s  Systemic Diam: 2.10 cm Dorris Carnes MD Electronically signed by Dorris Carnes MD Signature Date/Time: 09/18/2022/3:50:31 PM    Final    DG Abd 1 View  Result Date: 09/17/2022 CLINICAL DATA:  Vomiting EXAM: ABDOMEN - 1 VIEW COMPARISON:  None Available. FINDINGS: Nonobstructive bowel-gas pattern. Moderate stool burden. No radio-opaque calculi or other significant radiographic abnormality are seen. IMPRESSION: Nonobstructive bowel-gas pattern. Moderate stool burden. Electronically Signed   By: Yetta Glassman M.D.   On: 09/17/2022 09:21   DG Chest Portable 1 View  Result Date: 09/17/2022 CLINICAL DATA:  Dyspnea EXAM: PORTABLE CHEST 1 VIEW COMPARISON:  10/12/2021 FINDINGS: Bibasilar pulmonary infiltrates are present, likely  infectious or inflammatory in the acute setting. Pulmonary insufflation is normal and symmetric. No pneumothorax or pleural effusion. Cardiac size is within normal limits. Pulmonary vascularity is normal. No acute bone abnormality. Vascular calcifications are seen within the right upper extremity arterial vasculature IMPRESSION: 1. Bibasilar pulmonary infiltrates, likely infectious or inflammatory. Electronically Signed   By: Fidela Salisbury M.D.   On: 09/17/2022 02:49    Microbiology: Results for orders placed or performed during the hospital encounter of 09/17/22  SARS Coronavirus 2 by RT PCR (hospital order, performed in [facility] hospital lab) *cepheid single result test* Anterior Nasal Swab     Status: None   Collection Time: 09/17/22  3:44 AM   Specimen: Anterior Nasal Swab  Result Value Ref Range Status   SARS Coronavirus 2 by RT PCR NEGATIVE NEGATIVE Final    Comment: (NOTE) SARS-CoV-2 target nucleic acids are NOT DETECTED.  The SARS-CoV-2 RNA is generally detectable in upper and lower respiratory specimens during the acute phase of infection. The lowest concentration of SARS-CoV-2 viral copies this assay can detect is 250 copies / mL. A negative result does not preclude SARS-CoV-2 infection and should not be used as the sole basis for treatment or other patient management decisions.  A negative result may occur with improper specimen collection / handling, submission of specimen other than nasopharyngeal swab, presence of viral mutation(s) within the areas targeted by this assay, and inadequate number of viral copies (<250 copies / mL). A negative result must be combined with clinical observations, patient history, and epidemiological information.  Fact Sheet for Patients:   https://www.patel.info/  Fact Sheet for Healthcare Providers: https://hall.com/  This test is not yet approved or  cleared by the [place] FDA and has been  authorized for detection and/or diagnosis of SARS-CoV-2 by FDA under an Emergency Use Authorization (EUA).  This EUA will remain in effect (meaning this test can be used) for the  duration of the COVID-19 declaration under Section 564(b)(1) of the Act, 21 U.S.C. section 360bbb-3(b)(1), unless the authorization is terminated or revoked sooner.  Performed at [facility], [place] [address], [place], [place] [address]     Labs: CBC: Recent Labs  Lab 09/17/22 0227 09/18/22 0037 09/19/22 0702  WBC 11.8* 9.5 6.3  NEUTROABS 10.9* 7.9*  --   HGB 10.8* 9.5* 10.1*  HCT 32.5* 27.9* 29.4*  MCV 92.6 91.8 91.0  PLT 146* 116* 893*   Basic Metabolic Panel: Recent Labs  Lab 09/17/22 0227 09/18/22 0037 09/19/22 0702 09/20/22 0054 09/21/22 0818  NA 132* 127* 128* 129* 127*  K 4.4 3.8 4.1 4.4 3.4*  CL 95* 92* 96* 89* 85*  CO2 _0 GLUCOSE 127* 101* 116* 104* 127*  BUN 32* 30* 30* 31* 30*  CREATININE 1.44* 1.41* 1.33* 1.44* 1.47*  CALCIUM 8.6* 7.8* 8.1* 8.1* 8.4*  MG  --  1.4* 1.9  --   --    Liver Function Tests: Recent Labs  Lab 09/17/22 0227 09/18/22 0037  AST 38 33  ALT 37 30  ALKPHOS 109 73  BILITOT 0.8 1.1  PROT 6.6 5.6*  ALBUMIN 3.0* 2.5*   CBG: No results for input(s): "GLUCAP" in the last 168 hours.  Discharge time spent: greater than 30 minutes.  Signed: Elmarie Shiley, MD [facility] 09/21/2022

## 2022-09-21 NOTE — Progress Notes (Signed)
Rounding Note    Patient Name: Edward [name] Date of Encounter: 09/21/2022  [place] Cardiologist: Quay Burow, MD   Subjective   No CP or SOB.  Wants to go home.  Given an addition does of IV Lasix yesterday.  Na now down from 129>>127  Inpatient Medications    Scheduled Meds:  apixaban  5 mg Oral BID   aspirin EC  81 mg Oral Daily   atorvastatin  40 mg Oral Daily   bisacodyl  10 mg Rectal Once   cloNIDine  0.2 mg Oral Daily   doxycycline  100 mg Oral Q12H   feeding supplement  237 mL Oral TID BM   finasteride  5 mg Oral Daily   irbesartan  150 mg Oral Daily   magnesium oxide  200 mg Oral BID   metoprolol tartrate  100 mg Oral BID   pantoprazole  40 mg Oral Daily   potassium chloride  40 mEq Oral Once   tamsulosin  0.4 mg Oral Daily   Continuous Infusions:   PRN Meds: acetaminophen **OR** acetaminophen, ondansetron **OR** ondansetron (ZOFRAN) IV, mouth rinse, sodium chloride   Vital Signs    Vitals:   09/20/22 1956 09/20/22 2032 09/21/22 0005 09/21/22 0407  BP: 133/78   134/78  Pulse:    93  Resp: '18 16  18  '$ Temp: 98 F (36.7 C)   98.7 F (37.1 C)  TempSrc: Oral   Oral  SpO2: 98%   95%  Weight:   72.4 kg   Height:        Intake/Output Summary (Last 24 hours) at 09/21/2022 0955 Last data filed at 09/21/2022 0800 Gross per 24 hour  Intake 690 ml  Output 2170 ml  Net -1480 ml       09/21/2022   12:05 AM 09/20/2022    4:11 AM 09/19/2022    5:33 AM  Last 3 Weights  Weight (lbs) 159 lb 11.2 oz 163 lb 5.8 oz 168 lb 14.4 oz  Weight (kg) 72.439 kg 74.1 kg 76.613 kg      Telemetry    Afib with CVR- Personally Reviewed  ECG    N/a - Personally Reviewed  Physical Exam   VS:  BP 134/78 (BP Location: Left Arm)   Pulse 93   Temp 98.7 F (37.1 C) (Oral)   Resp 18   Ht '5\' 7"'$  (1.702 m)   Wt 72.4 kg   SpO2 95%   BMI 25.01 kg/m  , BMI Body mass index is 25.01 kg/m. GEN: Well nourished, well developed in no acute  distress HEENT: Normal NECK: No JVD; No carotid bruits LYMPHATICS: No lymphadenopathy CARDIAC:irregularly irregular, no murmurs, rubs, gallops RESPIRATORY:  Clear to auscultation without rales, wheezing or rhonchi  ABDOMEN: Soft, non-tender, non-distended MUSCULOSKELETAL:  No edema; No deformity  SKIN: Warm and dry NEUROLOGIC:  Alert and oriented x 3 PSYCHIATRIC:  Normal affect  Labs    High Sensitivity Troponin:   Recent Labs  Lab 09/17/22 0227 09/17/22 0410  TROPONINIHS 17 25*      Chemistry Recent Labs  Lab 09/17/22 0227 09/18/22 0037 09/19/22 0702 09/20/22 0054 09/21/22 0818  NA 132* 127* 128* 129* 127*  K 4.4 3.8 4.1 4.4 3.4*  CL 95* 92* 96* 89* 85*  CO2 '24 24 25 27 28  '$ GLUCOSE 127* 101* 116* 104* 127*  BUN 32* 30* 30* 31* 30*  CREATININE 1.44* 1.41* 1.33* 1.44* 1.47*  CALCIUM 8.6* 7.8* 8.1* 8.1* 8.4*  MG  --  1.4* 1.9  --   --   PROT 6.6 5.6*  --   --   --   ALBUMIN 3.0* 2.5*  --   --   --   AST 38 33  --   --   --   ALT 37 30  --   --   --   ALKPHOS 109 73  --   --   --   BILITOT 0.8 1.1  --   --   --   GFRNONAA 46* 47* 51* 46* 45*  ANIONGAP '13 11 7 13 14     '$ Lipids No results for input(s): "CHOL", "TRIG", "HDL", "LABVLDL", "LDLCALC", "CHOLHDL" in the last 168 hours.  Hematology Recent Labs  Lab 09/17/22 0227 09/18/22 0037 09/19/22 0702  WBC 11.8* 9.5 6.3  RBC 3.51* 3.04* 3.23*  HGB 10.8* 9.5* 10.1*  HCT 32.5* 27.9* 29.4*  MCV 92.6 91.8 91.0  MCH 30.8 31.3 31.3  MCHC 33.2 34.1 34.4  RDW 13.6 13.9 13.4  PLT 146* 116* 122*    Thyroid No results for input(s): "TSH", "FREET4" in the last 168 hours.  BNP Recent Labs  Lab 09/17/22 0227 09/18/22 0037  BNP 712.6* 468.8*     DDimer No results for input(s): "DDIMER" in the last 168 hours.   Radiology    No results found.  Cardiac Studies   Echo 09/18/22: 1. Compared to previous echo, LVEF and RVEF are unchanged; TR is more  severe.   2. Left ventricular ejection fraction, by  estimation, is 55 to 60%. The  left ventricle has normal function. The left ventricle has no regional  wall motion abnormalities. There is mild left ventricular hypertrophy.  Left ventricular diastolic parameters  are indeterminate.   3. Right ventricular systolic function is low normal. The right  ventricular size is mildly enlarged. There is severely elevated pulmonary  artery systolic pressure.   4. Left atrial size was moderately dilated.   5. Right atrial size was severely dilated.   6. A small pericardial effusion is present.   7. Mild mitral valve regurgitation.   8. Tricuspid valve regurgitation is severe.   9. The aortic valve is tricuspid. Aortic valve regurgitation is not  visualized. Aortic valve sclerosis is present, with no evidence of aortic  valve stenosis.  10. The inferior vena cava is dilated in size with <50% respiratory  variability, suggesting right atrial pressure of 15 mmHg.   Patient Profile     Mr. Birdsell is a 89M with permanent atrial fibrillation, carotid stenosis, subclavian stenosis, hypertension, hyperlipidemia, hyponatremia, HFpEF admitted with pneumonia in the setting of nausea and vomiting.  Cardiology consulted for atrial fibrillation with RVR, elevated troponin and elevated BNP.   Assessment & Plan    # Acute on chronic diastolic heart failure: # Shortess of breath: # Severe pulmonary HTN: # Severe TR:  -BNP elevated to 712>>468 this admission.   -Echo showed normal LVF with grade II diastolic dysfunction, severe TR, RA pressure 15 mmHg.   -Treated with IV Lasix x 3 doses  -He put out 2.37 L yesterday and is net 1.89 L since admission  -Serum sodium slightly improved from 127-->>129 yesterday but now back to 127 today >>in review of chart serum Na has trended around mid to low 120's over the past year so current Na level is good for him -Serum creatinine slightly bumped from 1.44>>1.47 today -Continue with pneumonia management per IM.   -Add  SGLT2 inhibitor prior to discharge.   -  Unclear why he has severe pulmonary HTN and severe TR.  May all be related to HFpEF and elevated pulmonary pressure.  This needs to be reassess once he recovers from PNA and CHF exacerbation -He denies smoking history.   -Rule out chronic thromboembolic disease if pressure remains severely elevated after diuresis (would reassess with echo outpt) -continue TED hose stockings for LE edema -he is not on lasix at home>>recommend sending home on Lasix '20mg'$  daily -he needs a BMET repeated on Wed this week   # Hypertension:  -BP much improved today at 134/79mHg -Continue irbesartan 150 mg daily, clonidine 0.2 mg daily and Lopressor '100mg'$  BID   # Permanent atrial fibrillation: -HR remains controlled -continue Lopressor '100mg'$  BID and Apixaban '5mg'$  BID -need to follow renal function closely>>if SCr trends upwards may need to cut back dose of apixaban  # CAD:  # Elevated troponin:  # hyperlipidemia: -Remote PCI.   -Hs-troponin mildly elevated.  He has no chest pain.  No plan for an ischemic evaluation at this time.  -Continue atorvastatin, ASA 81 mg daily and beta-blocker.  - Unclear he needs aspirin in addition to Eliquis but this can be revisited outpatient.  [facility] will sign off.   Medication Recommendations:  Apixaban '5mg'$  BID, ASA '81mg'$  daily, Atorvastatin '40mg'$  daily, Clonidine 0.'2mg'$  daily, Valsartan '160mg'$  daily, Lopressor '100mg'$  BID, Kdur 186m daily, lasix '20mg'$  daily Other recommendations (labs, testing, etc):  BMET Wednesday [facility] an [facility]    I have spent a total of 35 minutes with patient reviewing 2D echo , telemetry, EKGs, labs and examining patient as well as establishing an assessment and plan that was discussed with the patient.  > 50% of time was spent in direct patient care.    For questions or updates, please contact [facility] consult [email or web address] for contact info  under        Signed, TrFransico HimMD  09/21/2022, 9:55 AM

## 2022-09-21 NOTE — Discharge Instructions (Addendum)
You have new medication : Lasix, take it in the morning. It will help with fluid retention and Lower extremity edema.  Your metoprolol dose was increase to 100 mg Twice a day.  You have have 2 days of Cefdinir an antibiotics for Pneumonia and one more day of Doxycicline for PNA.   You need to follow up with Dr Gwenlyn Found office for lab work on Wednesday, to check your Sodium level. Please call office to arrange time.   To prevent Reflux:  Eat small, frequent meals instead of large meals. Avoid drinking large amounts of liquid with your meals. Avoid eating meals during the 2-3 hours before bedtime. Avoid lying down right after you eat.  Ask your PCP to refer you to Gastroenterologist

## 2022-09-21 NOTE — Progress Notes (Signed)
Discussed with patient - Atrovent is on formulary. Patient would still like to use home supply - aware of home medication policy.    Medication verified at bedside by Pharmacist. Correct medication and expiration. Will be stored at bedside.    Patient is aware that medication must be administered in the presence of nursing.    Gena Fray, PharmD PGY1 Pharmacy Resident   09/21/2022 9:57 AM

## 2022-09-24 ENCOUNTER — Other Ambulatory Visit: Payer: Self-pay | Admitting: Nurse Practitioner

## 2022-09-24 DIAGNOSIS — N1831 Chronic kidney disease, stage 3a: Secondary | ICD-10-CM | POA: Diagnosis not present

## 2022-09-25 LAB — BASIC METABOLIC PANEL
BUN/Creatinine Ratio: 23 (ref 10–24)
BUN: 40 mg/dL — ABNORMAL HIGH (ref 10–36)
CO2: 22 mmol/L (ref 20–29)
Calcium: 9.4 mg/dL (ref 8.6–10.2)
Chloride: 91 mmol/L — ABNORMAL LOW (ref 96–106)
Creatinine, Ser: 1.76 mg/dL — ABNORMAL HIGH (ref 0.76–1.27)
Glucose: 84 mg/dL (ref 70–99)
Potassium: 4.5 mmol/L (ref 3.5–5.2)
Sodium: 131 mmol/L — ABNORMAL LOW (ref 134–144)
eGFR: 36 mL/min/{1.73_m2} — ABNORMAL LOW (ref >59)

## 2022-09-29 ENCOUNTER — Encounter: Payer: Self-pay | Admitting: Student

## 2022-09-29 ENCOUNTER — Ambulatory Visit: Payer: Medicare Other | Attending: Nurse Practitioner | Admitting: Student

## 2022-09-29 VITALS — BP 124/76 | HR 60 | Ht 69.0 in | Wt 166.2 lb

## 2022-09-29 DIAGNOSIS — I2510 Atherosclerotic heart disease of native coronary artery without angina pectoris: Secondary | ICD-10-CM

## 2022-09-29 DIAGNOSIS — I701 Atherosclerosis of renal artery: Secondary | ICD-10-CM

## 2022-09-29 DIAGNOSIS — I272 Pulmonary hypertension, unspecified: Secondary | ICD-10-CM | POA: Diagnosis not present

## 2022-09-29 DIAGNOSIS — I4821 Permanent atrial fibrillation: Secondary | ICD-10-CM | POA: Diagnosis not present

## 2022-09-29 DIAGNOSIS — I5032 Chronic diastolic (congestive) heart failure: Secondary | ICD-10-CM | POA: Diagnosis not present

## 2022-09-29 DIAGNOSIS — I6523 Occlusion and stenosis of bilateral carotid arteries: Secondary | ICD-10-CM

## 2022-09-29 DIAGNOSIS — I071 Rheumatic tricuspid insufficiency: Secondary | ICD-10-CM | POA: Diagnosis not present

## 2022-09-29 DIAGNOSIS — I771 Stricture of artery: Secondary | ICD-10-CM

## 2022-09-29 DIAGNOSIS — E785 Hyperlipidemia, unspecified: Secondary | ICD-10-CM

## 2022-09-29 DIAGNOSIS — I10 Essential (primary) hypertension: Secondary | ICD-10-CM | POA: Diagnosis not present

## 2022-09-29 DIAGNOSIS — N1832 Chronic kidney disease, stage 3b: Secondary | ICD-10-CM

## 2022-09-29 DIAGNOSIS — E871 Hypo-osmolality and hyponatremia: Secondary | ICD-10-CM | POA: Diagnosis not present

## 2022-09-29 NOTE — Progress Notes (Signed)
Cardiology Office Note:    Date:  09/29/2022   ID:  Edward [name], DOB 1929/12/07, MRN [id number]  PCP:  Mayra Neer, MD  Cardiologist:  Quay Burow, MD  Electrophysiologist:  Constance Haw, MD   Referring MD: Mayra Neer, MD   Chief Complaint: hospital follow-up of CHF  History of Present Illness:    Edward [name] is a 88 y.o. male with a history of CAD s/p remote PCI with BMS to proximal to mid RCA in 2001 and PTCA of RCA in 2002, chronic HFpEF, permanent atrial fibrillation on Eliquis, SVT, bilateral carotid/ subclavian artery disease, renal artery stenosis s/p bilateral stenting in 2007, pulmonary hypertension, hypertension, hyperlipidemia, dysphagia from esophageal dysmotility, and peripheral neuropathy who is followed by Dr. Gwenlyn Found and Dr. Curt Bears and presents today for hospital follow-up of CHF.  Patient has a history of CAD with remote PCI to RCA. Last ischemic evaluation was a Myoview in 11/2015 which showed a small defect in the basal inferior and mid inferior locations felt to be mostly likely diaphragmatic attenuation but no ischemia. Monitor in 02/2021 showed underlying sinus rhythm with one 4 beat run of NSVT and 380 rubs of SVT with the longest run lasting 12 minutes as well as rare PACs and frequent PVCs (7.8% burden). He was diagnosed with atrial fibrillation during an admission in 10/2021 for profound hyponatremia. There was initially discussion about attempting DCCV following this hospitalization but patient decided to focus on rate control. He also has known PAD involving the carotid, subclavian, and renal arteries with prior stenting to renal arteries. Last renal artery ultrasounds in 02/2021 showed widely patent stents. Last carotid dopplers in 11/2021 showed 40-59% stenosis bilaterally, retrograde flow of the right vertebral artery, and stenotic subclavian arteries bilaterally with disturbed arterial flow on the right.  Patient was recently admitted from  09/17/2022 to 09/21/2022 for bibasilar pneumonia after presenting with nausea/vomiting and cough. He was treated with antibiotics. BNP was elevated at 712 >> 468 and he was also felt to have a component of diastolic CHF.  Echo showed LVEF of 55-60% with normal wall motion, normal RV, biatrial enlargement (right > left), mild MR, severe TR, and severe pulmonary hypertension with RVSP of 68.9 mmHg as well as a small pericardial effusion.  He was diuresed with IV Lasix and was transitioned to PO Lasix 20 mg daily at discharge.  It is unclear why he now had severe pulmonary hypertension and severe TR.  Possibly due to acute CHF.  Plan was to reassess this after he has recovered from pneumonia and CHF exacerbation.  If pulmonary artery pressures remain severely elevated, will need to rule out chronic thromboembolic disease.  He was initially in A-fib with RVR on admission but rates improved with management of his CHF and pneumonia.  High-sensitivity troponin was minimally elevated and flat at 17 >> 25 which is felt to be due to demand ischemia from underlying illness.  Hospitalization was also complicated by hyponatremia which was initially felt to be due to hypovolemic in setting of vomiting. However, there was also concern that his CHF may be playing a role but sodium did not improve with IV diuresis. He does seemed to have some chronic hyponatremia.   Patient here today with daughter [name]. Overall, patient is doing well since recent discharge. He reports some dyspnea on exertion if he is walking quickly but this is not new and is stable. No shortness of breath at rest, orthopnea, PND, or lower extremity edema. No  chest pain, palpitations, lightheadedness, dizziness, or syncope. He has been on Aspirin on Eliquis for a while and denies any abnormal bleeding on this. He does report some constipation since discharge but this improved after taking a stool softener. He has an external hemorrhoid which has been a little  painful with his constipation but it is not bleeding.   Past Medical History:  Diagnosis Date   Atherosclerosis of abdominal aorta (HCC)    CT/abd and pelvis   Carotid artery occlusion    bilateral carotid bruit, right greater then left -bilateral 40-50% stenosis, 12/26/09- no change   Chronic anxiety    Coronary artery disease    s/p BM stent, prox and mid RCA, 2001, cutting ballon RCA stenosis, 2002   Dysphagia    from esophageal dysmotility-tx with careful eating.    History of echocardiogram    2/11 echo EF 70%, mild MR, mildly elevated pulmonary pressures 42 mmHg   History of renal angiogram    9/10, showed patent renal stents, 30-40% instent restenosis ws the most severe lesion   Hyperlipidemia    Left kidney mass    lower pole, observing by urology- Dr. Reece Agar   Peripheral neuropathy    in both feet from nerve compression    Renovascular hypertension    s./p. bilateral RA stent implant   Subclavian artery stenosis [facility])     Past Surgical History:  Procedure Laterality Date   CARDIAC CATHETERIZATION  2001   BM stent, prox and mid RCA   cataract surgery Bilateral 04/14   COLONOSCOPY     every 10 years   left foot surgery     RENAL ARTERY STENT  08/07    Current Medications: Current Meds  Medication Sig   apixaban (ELIQUIS) 5 MG TABS tablet Take 1 tablet (5 mg total) by mouth 2 (two) times daily.   aspirin EC 81 MG tablet Take 1 tablet (81 mg total) by mouth daily.   atorvastatin (LIPITOR) 40 MG tablet Take 1 tablet (40 mg total) by mouth daily.   Calcium Citrate-Vitamin D (CITRACAL PETITES/VITAMIN D PO) Take 1 tablet by mouth every evening.   cloNIDine (CATAPRES) 0.1 MG tablet Take 2 tablets (0.2 mg total) by mouth daily.   Coenzyme Q10 (CO Q-10) 100 MG CAPS Take 100 mg by mouth daily.   finasteride (PROSCAR) 5 MG tablet Take 5 mg by mouth daily.   folic acid (FOLVITE) 623 MCG tablet Take 400 mcg by mouth daily.   furosemide (LASIX) 20 MG tablet Take 1 tablet  (20 mg total) by mouth daily.   LORazepam (ATIVAN) 1 MG tablet Take 1 mg by mouth in the morning and at bedtime.   metoprolol tartrate (LOPRESSOR) 100 MG tablet Take 1 tablet (100 mg total) by mouth 2 (two) times daily.   Multiple Vitamins-Minerals (PRESERVISION AREDS 2) CAPS Take by mouth.   Omega-3 Fatty Acids (FISH OIL) 1000 MG CAPS Take 1,200 mg by mouth in the morning and at bedtime.   pantoprazole (PROTONIX) 40 MG tablet Take 1 tablet (40 mg total) by mouth 2 (two) times daily.   Polyethyl Glycol-Propyl Glycol 0.4-0.3 % SOLN Apply 1 drop to eye at bedtime as needed (dryness).   potassium chloride SA (KLOR-CON M) 20 MEQ tablet Take 1 tablet (20 mEq total) by mouth daily.   tamsulosin (FLOMAX) 0.4 MG CAPS capsule Take 0.4 mg by mouth daily.   valsartan (DIOVAN) 320 MG tablet TAKE 1/2 TABLET BY MOUTH 2 TIMES DAILY. (Patient taking differently: Take  160 mg by mouth in the morning and at bedtime.)     Allergies:   Amlodipine, Erythromycin, Penicillins, and Prednisone   Social History   Socioeconomic History   Marital status: Married    Spouse name: Not on file   Number of children: Not on file   Years of education: Not on file   Highest education level: Not on file  Occupational History   Not on file  Tobacco Use   Smoking status: Never   Smokeless tobacco: Never  Vaping Use   Vaping Use: Never used  Substance and Sexual Activity   Alcohol use: No   Drug use: No   Sexual activity: Not on file  Other Topics Concern   Not on file  Social History Narrative   Not on file   Social Determinants of Health   Financial Resource Strain: Not on file  Food Insecurity: No Food Insecurity (09/17/2022)   Hunger Vital Sign    Worried About Running Out of Food in the Last Year: Never true    Ran Out of Food in the Last Year: Never true  Transportation Needs: No Transportation Needs (09/17/2022)   PRAPARE - Hydrologist (Medical): No    Lack of  Transportation (Non-Medical): No  Physical Activity: Not on file  Stress: Not on file  Social Connections: Not on file     Family History: The patient's family history includes Hypertension in his father and mother.  ROS:   Please see the history of present illness.     EKGs/Labs/Other Studies Reviewed:    The following studies were reviewed:  Echocardiogram 09/18/2022: Impressions: 1. Compared to previous echo, LVEF and RVEF are unchanged; TR is more  severe.   2. Left ventricular ejection fraction, by estimation, is 55 to 60%. The  left ventricle has normal function. The left ventricle has no regional  wall motion abnormalities. There is mild left ventricular hypertrophy.  Left ventricular diastolic parameters  are indeterminate.   3. Right ventricular systolic function is low normal. The right  ventricular size is mildly enlarged. There is severely elevated pulmonary  artery systolic pressure.   4. Left atrial size was moderately dilated.   5. Right atrial size was severely dilated.   6. A small pericardial effusion is present.   7. Mild mitral valve regurgitation.   8. Tricuspid valve regurgitation is severe.   9. The aortic valve is tricuspid. Aortic valve regurgitation is not  visualized. Aortic valve sclerosis is present, with no evidence of aortic  valve stenosis.  10. The inferior vena cava is dilated in size with <50% respiratory  variability, suggesting right atrial pressure of 15 mmHg.    EKG:  EKG not ordered today.   Recent Labs: 10/11/2021: TSH 1.471 09/18/2022: ALT 30; B Natriuretic Peptide 468.8 09/19/2022: Hemoglobin 10.1; Magnesium 1.9; Platelets 122 09/24/2022: BUN 40; Creatinine, Ser 1.76; Potassium 4.5; Sodium 131  Recent Lipid Panel    Component Value Date/Time   CHOL 110 03/13/2014 0824   TRIG 91.0 03/13/2014 0824   HDL 40.50 03/13/2014 0824   CHOLHDL 3 03/13/2014 0824   VLDL 18.2 03/13/2014 0824   LDLCALC 51 03/13/2014 0824    Physical  Exam:    Vital Signs: BP 124/76   Pulse 60   Ht '5\' 9"'$  (1.753 m)   Wt 166 lb 3.2 oz (75.4 kg)   SpO2 98%   BMI 24.54 kg/m     Wt Readings from Last 3 Encounters:  09/29/22 166 lb 3.2 oz (75.4 kg)  09/21/22 159 lb 11.2 oz (72.4 kg)  06/27/22 170 lb 12.8 oz (77.5 kg)     General: 88 y.o. thin Caucasian male in no acute distress. HEENT: Normocephalic and atraumatic. Sclera clear.  Neck: Supple. No JVD. Heart: Irregularly irregular rhythm with normal rate. Distinct S1 and S2. No murmurs, gallops, or rubs.  Lungs: No increased work of breathing. Clear to ausculation bilaterally. No wheezes, rhonchi, or rales.  Abdomen: Soft, non-distended, and non-tender to palpation. Bowel sounds present in all 4 quadrants.  Extremities: No lower extremity edema. Wearing compression stockings. Skin: Warm and dry. Neuro: Alert and oriented x3. No focal deficits. Psych: Normal affect. Responds appropriately.   Assessment:    1. Chronic diastolic CHF (congestive heart failure) ([place])   2. Coronary artery disease involving native coronary artery of native heart without angina pectoris   3. Permanent atrial fibrillation ([place])   4. Severe pulmonary hypertension ([place])   5. Severe tricuspid regurgitation   6. Essential hypertension   7. Hyperlipidemia, unspecified hyperlipidemia type   8. Bilateral carotid artery stenosis   9. Subclavian artery stenosis (HCC)   10. Renal artery stenosis ([place])   11. Stage 3b chronic kidney disease ([place])   12. Hyponatremia     Plan:    Chronic HFpEF Patient recently admitted with bilateral pneumonia and acute diastolic CHF. Echo during admission showed LVEF of 55-60% with normal wall motion, normal RV, biatrial enlargement (right > left), mild MR, severe TR, and severe pulmonary hypertension with RVSP of 68.9 mmHg as well as a small pericardial effusion. - Patient reports some dyspnea when walking too quickly but this is not new. Appears euvolemic on exam.  - Will  recheck BMET and BNP. - Continue Lasix '20mg'$  daily for now.  - Discussed importance of daily weighs and asked patient to notify us if he gains 3lbs in 1 day or 5lbs in 1 week.  - Of note, creatinine had bumped from 1.4 to 1.76 on repeat labs last week. May need to consider dropping Lasix to PRN depending on where renal function is today on repeat labs. Could also consider adding SGLT2 inhibitor.   Of note, daughter [name] would like to be the one called with results as patient is hard of hearing and wife as dementia. Patient is okay with this. Her numbner is [phone].   CAD S/p remote PCI to RCA. - No chest pain.  - Continue aspirin, beta-blocker, and statin.  Of note, patient has been on both Aspirin and Eliquis for a while now and is tolerating it well with no abnormal bleeding. He has significant vascular disease so OK to continue both for now but would have a low threshold for stopping Aspirin if he has any abnormal bleeding.   Permanent Atrial Fibrillation Rate controlled. - Continue Lopressor '100mg'$  twice daily.  - Continue Eliquis '5mg'$  twice daily.  Severe Pulmonary Hypertension Severe Tricuspid Regurgitation Echo during admission showed new severe pulmonary hypertension with RVSP of 68.9 mmHg and severe TR (increased from mild pulmonary hypertension with RVSP of 45 mmHg and moderate TR on prior Echo in 10/2021). Unclear unclear why he now had severe pulmonary hypertension and severe TR.  Possibly due to acute CHF. - Will repeat Echo in the next 1-2 months to reassess this now that he has recovered from pneumonia and CHF exacerbation.   Hypertension BP well controlled.  - Continue current medications: Valsartan '160mg'$  twice daily, Lopressor '100mg'$  twice daily, and Clonidine 0.'2mg'$   daily.  Hyperlipidemia - Continue Lipitor '40mg'$  daily.   Bilateral Carotid Stenosis  Bilateral Subclavian Stenosis Most recent carotid ultrasounds in 11/2021 showed 0-59% stenosis bilaterally, retrograde  flow of the right vertebral artery, and stenotic subclavian arteries bilaterally with disturbed arterial flow on the right. - Continue aspirin and statin. - Plan is for repeat imaging in 11/2022. Can order at next visit.  Renal Artery Stenosis S/p bilateral renal artery stenosis in 2007. - Continue aspirin and statin.  CKD Stage III Baseline creatinine around 1.4. Creatinine 1.76 on most recent labs on 09/24/2022. - Will repeat BMET today.  Hyponatremia He has some chronic hyponatremia. Sodium dropped as low as 127 during recent admission but had improved to 131 on most recent labs on 09/24/2022.  - Will repeat BMET today.  Disposition: Follow up in 2-3 months after Echo.   Medication Adjustments/Labs and Tests Ordered: Current medicines are reviewed at length with the patient today.  Concerns regarding medicines are outlined above.  Orders Placed This Encounter  Procedures   Brain natriuretic peptide   Basic metabolic panel   ECHOCARDIOGRAM COMPLETE   No orders of the defined types were placed in this encounter.   Patient Instructions  Medication Instructions:  No Changes *If you need a refill on your cardiac medications before your next appointment, please call your pharmacy*   Lab Work: BMET, BNP Today If you have labs (blood work) drawn today and your tests are completely normal, you will receive your results only by: [place] (if you have MyChart) OR A paper copy in the mail If you have any lab test that is abnormal or we need to change your treatment, we will call you to review the results.   Testing/Procedures: [address]. Your physician has requested that you have an echocardiogram. Echocardiography is a painless test that uses sound waves to create images of your heart. It provides your doctor with information about the size and shape of your heart and how well your heart's chambers and valves are working. This procedure takes  approximately one hour. There are no restrictions for this procedure. Please do NOT wear cologne, perfume, aftershave, or lotions (deodorant is allowed). Please arrive 15 minutes prior to your appointment time.    Follow-Up: At [facility], you and your health needs are our priority.  As part of our continuing mission to provide you with exceptional heart care, we have created designated Provider Care Teams.  These Care Teams include your primary Cardiologist (physician) and Advanced Practice Providers (APPs -  Physician Assistants and Nurse Practitioners) who all work together to provide you with the care you need, when you need it.  We recommend signing up for the patient portal called "MyChart".  Sign up information is provided on this After Visit Summary.  MyChart is used to connect with patients for Virtual Visits (Telemedicine).  Patients are able to view lab/test results, encounter notes, upcoming appointments, etc.  Non-urgent messages can be sent to your provider as well.   To learn more about what you can do with MyChart, go to [email or web address].    Your next appointment:   2-3 month(s)  The format for your next appointment:   In Person  Provider:   Sande Rives, PA-C   or, Quay Burow, MD    Signed, Darreld Mclean, PA-C  09/29/2022 10:11 PM    [place]

## 2022-09-29 NOTE — Patient Instructions (Signed)
Medication Instructions:  No Changes *If you need a refill on your cardiac medications before your next appointment, please call your pharmacy*   Lab Work: BMET, BNP Today If you have labs (blood work) drawn today and your tests are completely normal, you will receive your results only by: [place] (if you have MyChart) OR A paper copy in the mail If you have any lab test that is abnormal or we need to change your treatment, we will call you to review the results.   Testing/Procedures: [address], [address]. Your physician has requested that you have an echocardiogram. Echocardiography is a painless test that uses sound waves to create images of your heart. It provides your doctor with information about the size and shape of your heart and how well your heart's chambers and valves are working. This procedure takes approximately one hour. There are no restrictions for this procedure. Please do NOT wear cologne, perfume, aftershave, or lotions (deodorant is allowed). Please arrive 15 minutes prior to your appointment time.    Follow-Up: At [facility], you and your health needs are our priority.  As part of our continuing mission to provide you with exceptional heart care, we have created designated Provider Care Teams.  These Care Teams include your primary Cardiologist (physician) and Advanced Practice Providers (APPs -  Physician Assistants and Nurse Practitioners) who all work together to provide you with the care you need, when you need it.  We recommend signing up for the patient portal called "MyChart".  Sign up information is provided on this After Visit Summary.  MyChart is used to connect with patients for Virtual Visits (Telemedicine).  Patients are able to view lab/test results, encounter notes, upcoming appointments, etc.  Non-urgent messages can be sent to your provider as well.   To learn more about what you can do with MyChart, go to  [email or web address].    Your next appointment:   2-3 month(s)  The format for your next appointment:   In Person  Provider:   Sande Rives, PA-C   or, Quay Burow, MD

## 2022-09-30 ENCOUNTER — Telehealth: Payer: Self-pay | Admitting: Cardiovascular Disease

## 2022-09-30 ENCOUNTER — Other Ambulatory Visit: Payer: Self-pay

## 2022-09-30 DIAGNOSIS — E875 Hyperkalemia: Secondary | ICD-10-CM

## 2022-09-30 DIAGNOSIS — I5031 Acute diastolic (congestive) heart failure: Secondary | ICD-10-CM

## 2022-09-30 DIAGNOSIS — N1831 Chronic kidney disease, stage 3a: Secondary | ICD-10-CM

## 2022-09-30 LAB — POTASSIUM: Potassium: 5.3 mmol/L — ABNORMAL HIGH (ref 3.5–5.2)

## 2022-09-30 NOTE — Telephone Encounter (Signed)
Pt's daughter is returning call. Transferred to [place], [place].

## 2022-09-30 NOTE — Addendum Note (Signed)
Addended by: Waylan Rocher on: 09/30/2022 04:25 PM   Modules accepted: Orders

## 2022-09-30 NOTE — Telephone Encounter (Signed)
See result note.  

## 2022-10-01 ENCOUNTER — Ambulatory Visit: Payer: Medicare Other | Admitting: Nurse Practitioner

## 2022-10-01 LAB — BASIC METABOLIC PANEL
BUN/Creatinine Ratio: 20 (ref 10–24)
BUN: 38 mg/dL — ABNORMAL HIGH (ref 10–36)
CO2: 25 mmol/L (ref 20–29)
Calcium: 9.8 mg/dL (ref 8.6–10.2)
Chloride: 93 mmol/L — ABNORMAL LOW (ref 96–106)
Creatinine, Ser: 1.92 mg/dL — ABNORMAL HIGH (ref 0.76–1.27)
Glucose: 86 mg/dL (ref 70–99)
Potassium: 5.8 mmol/L (ref 3.5–5.2)
Sodium: 131 mmol/L — ABNORMAL LOW (ref 134–144)
eGFR: 33 mL/min/{1.73_m2} — ABNORMAL LOW (ref >59)

## 2022-10-01 LAB — BRAIN NATRIURETIC PEPTIDE: BNP: 401.6 pg/mL — ABNORMAL HIGH (ref 0.0–100.0)

## 2022-10-06 ENCOUNTER — Other Ambulatory Visit: Payer: Self-pay

## 2022-10-06 DIAGNOSIS — E875 Hyperkalemia: Secondary | ICD-10-CM

## 2022-10-07 LAB — BASIC METABOLIC PANEL
BUN/Creatinine Ratio: 28 — ABNORMAL HIGH (ref 10–24)
BUN: 43 mg/dL — ABNORMAL HIGH (ref 10–36)
CO2: 25 mmol/L (ref 20–29)
Calcium: 9.2 mg/dL (ref 8.6–10.2)
Chloride: 96 mmol/L (ref 96–106)
Creatinine, Ser: 1.53 mg/dL — ABNORMAL HIGH (ref 0.76–1.27)
Glucose: 84 mg/dL (ref 70–99)
Potassium: 5.3 mmol/L — ABNORMAL HIGH (ref 3.5–5.2)
Sodium: 132 mmol/L — ABNORMAL LOW (ref 134–144)
eGFR: 43 mL/min/{1.73_m2} — ABNORMAL LOW (ref >59)

## 2022-10-08 ENCOUNTER — Telehealth: Payer: Self-pay | Admitting: Cardiovascular Disease

## 2022-10-08 DIAGNOSIS — I5032 Chronic diastolic (congestive) heart failure: Secondary | ICD-10-CM

## 2022-10-08 DIAGNOSIS — E875 Hyperkalemia: Secondary | ICD-10-CM

## 2022-10-08 NOTE — Telephone Encounter (Signed)
Called patient daughter, advised I would send a message over to [place], [place] to review the recent blood work. I can send it to you as it doesn't come in the labs tab.   Thanks!

## 2022-10-08 NOTE — Telephone Encounter (Signed)
Patient's daughter is calling requesting the results and recommendations for the patient's labs from 10/06/22.

## 2022-10-09 MED ORDER — FUROSEMIDE 20 MG PO TABS: ORAL_TABLET | ORAL | 3 refills | Status: DC

## 2022-10-09 NOTE — Telephone Encounter (Signed)
Called and spoke with daughter [name] yesterday 10/08/2022 with BMET results and recommendations. Patient's kidney function is improving. Potassium still slightly elevated. I would really like to avoid giving him Lokelma if possible. Will restart Lasix '20mg'$  daily for 3 day  see if this help get his potassium back within normal limits. Can then reduce to PRN for weight gain (3lb in 1 day or 5 lbs in 1 week). Continue to remain off Valsartan and potassium supplement. Will repeat BMET in 1 week.  Also asked daughter to send [place] a log of his BP/HR is 2 weeks (I want to make sure his BP does okay off his Valsartan). Daughter voiced understanding and agreed and thanked me for calling.   I will order BMET for next week and update medication list.  Darreld Mclean, PA-C 10/09/2022 5:14 AM

## 2022-10-13 ENCOUNTER — Telehealth: Payer: Self-pay | Admitting: Cardiovascular Disease

## 2022-10-13 NOTE — Telephone Encounter (Signed)
Patient called back need refill to be sent today. Please see below.

## 2022-10-13 NOTE — Telephone Encounter (Signed)
*  STAT* If patient is at the pharmacy, call can be transferred to refill team.   1. Which medications need to be refilled? (please list name of each medication and dose if known)   LORazepam (ATIVAN) 1 MG tablet    2. Which pharmacy/location (including street and city if local pharmacy) is medication to be sent to?  [id number]- [place], [place] - [address]   3. Do they need a 30 day or 90 day supply?  90 day

## 2022-10-14 MED ORDER — LORAZEPAM 1 MG PO TABS: 1.0000 mg | ORAL_TABLET | Freq: Two times a day (BID) | ORAL | 3 refills | Status: DC

## 2022-10-14 MED ORDER — LORAZEPAM 1 MG PO TABS: 1.0000 mg | ORAL_TABLET | Freq: Two times a day (BID) | ORAL | 3 refills | Status: AC

## 2022-10-14 NOTE — Addendum Note (Signed)
Addended by: Lubertha Sayres on: 10/14/2022 11:51 AM   Modules accepted: Orders

## 2022-10-14 NOTE — Addendum Note (Signed)
Addended by: Lubertha Sayres on: 10/14/2022 11:53 AM   Modules accepted: Orders

## 2022-10-15 ENCOUNTER — Other Ambulatory Visit: Payer: Self-pay

## 2022-10-15 DIAGNOSIS — E875 Hyperkalemia: Secondary | ICD-10-CM

## 2022-10-16 LAB — BASIC METABOLIC PANEL
BUN/Creatinine Ratio: 23 (ref 10–24)
BUN: 43 mg/dL — ABNORMAL HIGH (ref 10–36)
CO2: 23 mmol/L (ref 20–29)
Calcium: 8.9 mg/dL (ref 8.6–10.2)
Chloride: 98 mmol/L (ref 96–106)
Creatinine, Ser: 1.87 mg/dL — ABNORMAL HIGH (ref 0.76–1.27)
Glucose: 91 mg/dL (ref 70–99)
Potassium: 5 mmol/L (ref 3.5–5.2)
Sodium: 136 mmol/L (ref 134–144)
eGFR: 34 mL/min/{1.73_m2} — ABNORMAL LOW (ref >59)

## 2022-10-17 ENCOUNTER — Other Ambulatory Visit: Payer: Self-pay

## 2022-10-17 DIAGNOSIS — E875 Hyperkalemia: Secondary | ICD-10-CM

## 2022-11-04 MED ORDER — AMLODIPINE BESYLATE 2.5 MG PO TABS: ORAL_TABLET | ORAL | 3 refills | Status: DC

## 2022-11-10 ENCOUNTER — Other Ambulatory Visit: Payer: Self-pay

## 2022-11-10 DIAGNOSIS — E875 Hyperkalemia: Secondary | ICD-10-CM | POA: Diagnosis not present

## 2022-11-10 LAB — BASIC METABOLIC PANEL
BUN/Creatinine Ratio: 23 (ref 10–24)
BUN: 38 mg/dL — ABNORMAL HIGH (ref 10–36)
CO2: 24 mmol/L (ref 20–29)
Calcium: 9.3 mg/dL (ref 8.6–10.2)
Chloride: 103 mmol/L (ref 96–106)
Creatinine, Ser: 1.68 mg/dL — ABNORMAL HIGH (ref 0.76–1.27)
Glucose: 102 mg/dL — ABNORMAL HIGH (ref 70–99)
Potassium: 4.5 mmol/L (ref 3.5–5.2)
Sodium: 140 mmol/L (ref 134–144)
eGFR: 38 mL/min/{1.73_m2} — ABNORMAL LOW (ref >59)

## 2022-11-14 ENCOUNTER — Ambulatory Visit (HOSPITAL_COMMUNITY)
Admission: RE | Admit: 2022-11-14 | Discharge: 2022-11-14 | Disposition: A | Discharge status: Home / Self Care | Payer: Medicare Other | Source: Ambulatory Visit | Attending: Cardiovascular Disease | Admitting: Cardiovascular Disease

## 2022-11-14 DIAGNOSIS — I6523 Occlusion and stenosis of bilateral carotid arteries: Secondary | ICD-10-CM | POA: Diagnosis not present

## 2022-11-14 DIAGNOSIS — G458 Other transient cerebral ischemic attacks and related syndromes: Secondary | ICD-10-CM | POA: Insufficient documentation

## 2022-11-17 ENCOUNTER — Other Ambulatory Visit (HOSPITAL_COMMUNITY): Payer: Medicare Other

## 2022-11-19 ENCOUNTER — Ambulatory Visit (HOSPITAL_COMMUNITY): Payer: Medicare Other | Attending: Cardiology

## 2022-11-19 DIAGNOSIS — I5032 Chronic diastolic (congestive) heart failure: Secondary | ICD-10-CM | POA: Diagnosis not present

## 2022-11-19 DIAGNOSIS — I272 Pulmonary hypertension, unspecified: Secondary | ICD-10-CM

## 2022-11-19 LAB — ECHOCARDIOGRAM COMPLETE: S' Lateral: 2.6 cm

## 2022-12-02 ENCOUNTER — Ambulatory Visit: Payer: Medicare Other | Admitting: Student

## 2022-12-04 ENCOUNTER — Other Ambulatory Visit: Payer: Self-pay | Admitting: Student

## 2022-12-04 DIAGNOSIS — I5032 Chronic diastolic (congestive) heart failure: Secondary | ICD-10-CM

## 2022-12-04 MED ORDER — FUROSEMIDE 20 MG PO TABS: ORAL_TABLET | ORAL | 3 refills | Status: DC

## 2022-12-11 ENCOUNTER — Encounter (HOSPITAL_COMMUNITY): Payer: Self-pay | Admitting: *Deleted

## 2022-12-24 ENCOUNTER — Encounter: Payer: Self-pay | Admitting: Cardiovascular Disease

## 2022-12-24 ENCOUNTER — Ambulatory Visit: Payer: Medicare Other | Attending: Cardiovascular Disease | Admitting: Cardiovascular Disease

## 2022-12-24 VITALS — BP 124/62 | HR 81 | Ht 69.0 in | Wt 171.2 lb

## 2022-12-24 DIAGNOSIS — I272 Pulmonary hypertension, unspecified: Secondary | ICD-10-CM

## 2022-12-24 DIAGNOSIS — E782 Mixed hyperlipidemia: Secondary | ICD-10-CM | POA: Diagnosis not present

## 2022-12-24 DIAGNOSIS — I701 Atherosclerosis of renal artery: Secondary | ICD-10-CM | POA: Diagnosis not present

## 2022-12-24 DIAGNOSIS — I10 Essential (primary) hypertension: Secondary | ICD-10-CM

## 2022-12-24 DIAGNOSIS — I2510 Atherosclerotic heart disease of native coronary artery without angina pectoris: Secondary | ICD-10-CM

## 2022-12-24 DIAGNOSIS — I2583 Coronary atherosclerosis due to lipid rich plaque: Secondary | ICD-10-CM

## 2022-12-24 DIAGNOSIS — I6521 Occlusion and stenosis of right carotid artery: Secondary | ICD-10-CM

## 2022-12-24 DIAGNOSIS — I4821 Permanent atrial fibrillation: Secondary | ICD-10-CM

## 2022-12-24 MED ORDER — METOPROLOL TARTRATE 100 MG PO TABS: 100.0000 mg | ORAL_TABLET | Freq: Two times a day (BID) | ORAL | 4 refills | Status: DC

## 2022-12-24 NOTE — Assessment & Plan Note (Signed)
2D echocardiogram performed 11/19/2022 revealed normal LV systolic function with mild to moderate pulm hypertension and severe TR.  He is relatively asymptomatic.  At this point, I do not feel inclined to continue to follow him noninvasively.

## 2022-12-24 NOTE — Assessment & Plan Note (Signed)
History of moderate ICA stenosis by duplex ultrasound performed 11/14/2022 with right subclavian artery disease as well.  We have discussed stopping duplex surveillance given his age.

## 2022-12-24 NOTE — Assessment & Plan Note (Signed)
Persistent A-fib rate controlled on Eliquis oral anticoagulation.

## 2022-12-24 NOTE — Assessment & Plan Note (Signed)
History of essential hypertension blood pressure measured today at 124/62.  He is on amlodipine, clonidine, and metoprolol.

## 2022-12-24 NOTE — Assessment & Plan Note (Signed)
History of hyperlipidemia on statin therapy with lipid profile performed 08/29/2022 revealing total cholesterol 105, LDL 43 and HDL 40.

## 2022-12-24 NOTE — Patient Instructions (Signed)
Medication Instructions:  Your physician recommends that you continue on your current medications as directed. Please refer to the Current Medication list given to you today.  *If you need a refill on your cardiac medications before your next appointment, please call your pharmacy*   Follow-Up: At [facility], you and your health needs are our priority.  As part of our continuing mission to provide you with exceptional heart care, we have created designated Provider Care Teams.  These Care Teams include your primary Cardiologist (physician) and Advanced Practice Providers (APPs -  Physician Assistants and Nurse Practitioners) who all work together to provide you with the care you need, when you need it.  We recommend signing up for the patient portal called "MyChart".  Sign up information is provided on this After Visit Summary.  MyChart is used to connect with patients for Virtual Visits (Telemedicine).  Patients are able to view lab/test results, encounter notes, upcoming appointments, etc.  Non-urgent messages can be sent to your provider as well.   To learn more about what you can do with MyChart, go to [email or web address].    Your next appointment:   6 month(s)  Provider:   Sande Rives, PA-C      Then, Quay Burow, MD will plan to see you again in 12 month(s).

## 2022-12-24 NOTE — Progress Notes (Signed)
12/24/2022 Edward [name]   02-01-1930  OT:[id number]  Primary Physician Mayra Neer, MD Primary Cardiologist: Lorretta Harp MD FACP, [place], [place], [place]  HPI:  Edward [name] is a 89 y.o.   married Caucasian male father of 2 children, grandfather of 5 grandchildren who worked at [employer]  in the past. I last saw him in the office 01/15/2019.  His wife Edward [name] is also a patient of mine who unfortunately has recent been diagnosed with Alzheimer's.  She accompanies him today, as does one of his daughters Edward [name].  his primary care physician is Dr. Serita Grammes. He was referred by Dr. Casandra Doffing for peripheral vascular evaluation but has expressed a desire to follow up with me for his cardiology care as well in the future. He has a history of treated hypertension and hyperlipidemia. He has never smoked. Her father did have A. Myocardial infarction in his 58s. He had an myocardial function in October of 2001 and had 2 NIR bare metal stents placed in his RCA by Dr. Quay Burow 08/20/00. He has had renal artery stenting bilaterally performed by Dr. Rolm Baptise 06/18/06. He had carotid Dopplers performed recently that showed moderate bilateral internal carotid artery stenosis with incidentally noted bilateral subclavian artery disease. His vertebral antegrade on the left and apparent on the right. There was a 40-50 mm pressure differential with the right arm being less than the left.  He does measure his blood pressure twice a day and has noticed evening hypertension.    He has lost hearing in his left ear on 05/08/2018 for unclear reasons.  Otherwise, he denies chest pain or shortness of breath.  He has had a UTI as well treated with ciprofloxacin seen in the ER because of this.  His major complaints are palpitations however.     He has had a witnessed syncopal episode for unclear reasons.  A 2D echo was unrevealing with normal LV systolic function and grade 2 diastolic dysfunction.  He had moderate  pulmonary hypertension.  There is no evidence of valvular heart disease.  A 2-week Zio patch was ordered as well and was mailed in earlier this week.  He did have carotid Dopplers performed in January that showed moderate bilateral ICA stenosis with bilateral subclavian artery stenosis.  There was retrograde right vertebral filling with a 70 mm upper extremity blood pressure differential right lower than left.  Since I saw him in the office a year ago he continues to do well.  He lives with his wife Edward [name].  He does drive.  His children are involved in their lives.  He said no further dizzy or syncopal episodes.  He denies chest pain but does get some dyspnea.  He remains in A-fib rate controlled on Eliquis oral anticoagulation.   Current Meds  Medication Sig   amLODipine (NORVASC) 2.5 MG tablet Take one tablet (2.60m) by mouth each day.   apixaban (ELIQUIS) 5 MG TABS tablet Take 1 tablet (5 mg total) by mouth 2 (two) times daily.   aspirin EC 81 MG tablet Take 1 tablet (81 mg total) by mouth daily.   atorvastatin (LIPITOR) 40 MG tablet Take 1 tablet (40 mg total) by mouth daily.   Azelastine HCl 137 MCG/SPRAY SOLN Place into both nostrils.   Calcium Citrate-Vitamin D (CITRACAL PETITES/VITAMIN D PO) Take 1 tablet by mouth every evening.   cloNIDine (CATAPRES) 0.1 MG tablet Take 2 tablets (0.2 mg total) by mouth daily.   Coenzyme Q10 (  CO Q-10) 100 MG CAPS Take 100 mg by mouth daily.   finasteride (PROSCAR) 5 MG tablet Take 5 mg by mouth daily.   folic acid (FOLVITE) A999333 MCG tablet Take 400 mcg by mouth daily.   furosemide (LASIX) 20 MG tablet Take 1 tablet (84m) once daily. Can take and extra 217mas needed for weight gain (3lbs in 1 day or 1lbs in 1 week) or worsening lower extremity edema.   LORazepam (ATIVAN) 1 MG tablet Take 1 tablet (1 mg total) by mouth in the morning and at bedtime.   Multiple Vitamins-Minerals (PRESERVISION AREDS 2) CAPS Take by mouth.   Omega-3 Fatty Acids (FISH OIL) 1000  MG CAPS Take 1,200 mg by mouth in the morning and at bedtime.   pantoprazole (PROTONIX) 40 MG tablet Take 1 tablet (40 mg total) by mouth 2 (two) times daily.   Polyethyl Glycol-Propyl Glycol 0.4-0.3 % SOLN Apply 1 drop to eye at bedtime as needed (dryness).   tamsulosin (FLOMAX) 0.4 MG CAPS capsule Take 0.4 mg by mouth daily.   [DISCONTINUED] metoprolol tartrate (LOPRESSOR) 100 MG tablet Take 1 tablet (100 mg total) by mouth 2 (two) times daily.     Allergies  Allergen Reactions   Amlodipine Other (See Comments)    Tolerates 2.5 mg, higher doses cause LEE with blistering   Erythromycin    Penicillins    Prednisone     Social History   Socioeconomic History   Marital status: Married    Spouse name: Not on file   Number of children: Not on file   Years of education: Not on file   Highest education level: Not on file  Occupational History   Not on file  Tobacco Use   Smoking status: Never   Smokeless tobacco: Never  Vaping Use   Vaping Use: Never used  Substance and Sexual Activity   Alcohol use: No   Drug use: No   Sexual activity: Not on file  Other Topics Concern   Not on file  Social History Narrative   Not on file   Social Determinants of Health   Financial Resource Strain: Not on file  Food Insecurity: No Food Insecurity (09/17/2022)   Hunger Vital Sign    Worried About Running Out of Food in the Last Year: Never true    Ran Out of Food in the Last Year: Never true  Transportation Needs: No Transportation Needs (09/17/2022)   PRAPARE - TrHydrologistMedical): No    Lack of Transportation (Non-Medical): No  Physical Activity: Not on file  Stress: Not on file  Social Connections: Not on file  Intimate Partner Violence: Not At Risk (09/17/2022)   Humiliation, Afraid, Rape, and Kick questionnaire    Fear of Current or Ex-Partner: No    Emotionally Abused: No    Physically Abused: No    Sexually Abused: No     Review of  Systems: General: negative for chills, fever, night sweats or weight changes.  Cardiovascular: negative for chest pain, dyspnea on exertion, edema, orthopnea, palpitations, paroxysmal nocturnal dyspnea or shortness of breath Dermatological: negative for rash Respiratory: negative for cough or wheezing Urologic: negative for hematuria Abdominal: negative for nausea, vomiting, diarrhea, bright red blood per rectum, melena, or hematemesis Neurologic: negative for visual changes, syncope, or dizziness All other systems reviewed and are otherwise negative except as noted above.    Blood pressure 124/62, pulse 81, height 5' 9"$  (1.753 m), weight 171 lb 3.2 oz (  77.7 kg), SpO2 99 %.  General appearance: alert Neck: no adenopathy, no JVD, supple, symmetrical, trachea midline, thyroid not enlarged, symmetric, no tenderness/mass/nodules, and bilateral carotid bruits Lungs: clear to auscultation bilaterally Heart: irregularly irregular rhythm Extremities: extremities normal, atraumatic, no cyanosis or edema Pulses: 2+ and symmetric Skin: Skin color, texture, turgor normal. No rashes or lesions Neurologic: Grossly normal  EKG atrial fibrillation with a ventricular sponsor of 81 and right bundle branch block.  I personally reviewed this EKG.  ASSESSMENT AND PLAN:   Coronary artery disease due to lipid rich plaque History of CAD with history of myocardial infarction October 2001.  He had RCA stenting by Dr. Quay Burow 08/20/2000 with 2 NIR bare-metal stents.  He has done well since without reintervention.  He denies chest pain.  Mixed hyperlipidemia History of hyperlipidemia on statin therapy with lipid profile performed 08/29/2022 revealing total cholesterol 105, LDL 43 and HDL 40.  Carotid artery stenosis History of moderate ICA stenosis by duplex ultrasound performed 11/14/2022 with right subclavian artery disease as well.  We have discussed stopping duplex surveillance given his  age.  Essential hypertension History of essential hypertension blood pressure measured today at 124/62.  He is on amlodipine, clonidine, and metoprolol.  Renal artery stenosis (HCC) History of bilateral renal artery stenosis status post stenting by Dr. Rolm Baptise 06/18/2006.  His most recent carotid Dopplers performed several years ago revealed these to be widely patent.  Permanent atrial fibrillation (HCC) Persistent A-fib rate controlled on Eliquis oral anticoagulation.  Pulmonary HTN ([place]) 2D echocardiogram performed 11/19/2022 revealed normal LV systolic function with mild to moderate pulm hypertension and severe TR.  He is relatively asymptomatic.  At this point, I do not feel inclined to continue to follow him noninvasively.     Lorretta Harp MD FACP,FACC,FAHA, [facility] 12/24/2022 2:36 PM

## 2022-12-24 NOTE — Assessment & Plan Note (Signed)
History of CAD with history of myocardial infarction October 2001.  He had RCA stenting by Dr. Quay Burow 08/20/2000 with 2 NIR bare-metal stents.  He has done well since without reintervention.  He denies chest pain.

## 2022-12-24 NOTE — Assessment & Plan Note (Signed)
History of bilateral renal artery stenosis status post stenting by Dr. Rolm Baptise 06/18/2006.  His most recent carotid Dopplers performed several years ago revealed these to be widely patent.

## 2022-12-31 NOTE — Addendum Note (Signed)
Addended by: Brantley Persons A on: 12/31/2022 03:38 PM   Modules accepted: Orders

## 2023-01-15 ENCOUNTER — Other Ambulatory Visit: Payer: Self-pay | Admitting: Student

## 2023-01-15 DIAGNOSIS — I5032 Chronic diastolic (congestive) heart failure: Secondary | ICD-10-CM

## 2023-01-19 ENCOUNTER — Telehealth: Payer: Self-pay | Admitting: Cardiovascular Disease

## 2023-01-19 NOTE — Telephone Encounter (Signed)
*  STAT* If patient is at the pharmacy, call can be transferred to refill team.   1. Which medications need to be refilled? (please list name of each medication and dose if known) metoprolol tartrate (LOPRESSOR) 100 MG tablet   2. Which pharmacy/location (including street and city if local pharmacy) is medication to be sent to? [id number] - [place], [place] - [place].   3. Do they need a 30 day or 90 day supply? [place]

## 2023-01-20 MED ORDER — METOPROLOL TARTRATE 100 MG PO TABS: 100.0000 mg | ORAL_TABLET | Freq: Two times a day (BID) | ORAL | 3 refills | Status: DC

## 2023-02-12 DIAGNOSIS — R194 Change in bowel habit: Secondary | ICD-10-CM | POA: Diagnosis not present

## 2023-03-02 DIAGNOSIS — I25119 Atherosclerotic heart disease of native coronary artery with unspecified angina pectoris: Secondary | ICD-10-CM | POA: Diagnosis not present

## 2023-03-02 DIAGNOSIS — D6869 Other thrombophilia: Secondary | ICD-10-CM | POA: Diagnosis not present

## 2023-03-02 DIAGNOSIS — I700 Atherosclerosis of aorta: Secondary | ICD-10-CM | POA: Diagnosis not present

## 2023-03-02 DIAGNOSIS — I150 Renovascular hypertension: Secondary | ICD-10-CM | POA: Diagnosis not present

## 2023-03-02 DIAGNOSIS — N183 Chronic kidney disease, stage 3 unspecified: Secondary | ICD-10-CM | POA: Diagnosis not present

## 2023-03-02 DIAGNOSIS — I4891 Unspecified atrial fibrillation: Secondary | ICD-10-CM | POA: Diagnosis not present

## 2023-03-02 DIAGNOSIS — E782 Mixed hyperlipidemia: Secondary | ICD-10-CM | POA: Diagnosis not present

## 2023-03-02 LAB — LAB REPORT - SCANNED
Creatinine, POC: 40 mg/dL
EGFR: 37
Microalb Creat Ratio: 84
Microalbumin, Urine: 3.36

## 2023-04-16 ENCOUNTER — Other Ambulatory Visit: Payer: Self-pay | Admitting: Student

## 2023-04-16 DIAGNOSIS — I5032 Chronic diastolic (congestive) heart failure: Secondary | ICD-10-CM

## 2023-05-18 ENCOUNTER — Telehealth: Payer: Self-pay | Admitting: Cardiovascular Disease

## 2023-05-18 ENCOUNTER — Other Ambulatory Visit: Payer: Self-pay | Admitting: *Deleted

## 2023-05-18 MED ORDER — CLONIDINE HCL 0.1 MG PO TABS: 0.2000 mg | ORAL_TABLET | Freq: Every day | ORAL | 0 refills | Status: DC

## 2023-05-18 NOTE — Telephone Encounter (Signed)
*  STAT* If patient is at the pharmacy, call can be transferred to refill team.   1. Which medications need to be refilled? (please list name of each medication and dose if known) cloNIDine (CATAPRES) 0.1 MG tablet   2. Which pharmacy/location (including street and city if local pharmacy) is medication to be sent to?  [id number] - [place], [place] - [address]    3. Do they need a 30 day or 90 day supply? 90

## 2023-07-15 DIAGNOSIS — I4891 Unspecified atrial fibrillation: Secondary | ICD-10-CM | POA: Diagnosis not present

## 2023-07-15 DIAGNOSIS — J3489 Other specified disorders of nose and nasal sinuses: Secondary | ICD-10-CM | POA: Diagnosis not present

## 2023-07-15 DIAGNOSIS — R0609 Other forms of dyspnea: Secondary | ICD-10-CM | POA: Diagnosis not present

## 2023-07-15 DIAGNOSIS — I25119 Atherosclerotic heart disease of native coronary artery with unspecified angina pectoris: Secondary | ICD-10-CM | POA: Diagnosis not present

## 2023-07-16 ENCOUNTER — Ambulatory Visit
Admission: RE | Admit: 2023-07-16 | Discharge: 2023-07-16 | Disposition: A | Discharge status: Home / Self Care | Payer: Medicare Other | Source: Ambulatory Visit | Attending: Family Medicine | Admitting: Family Medicine

## 2023-07-16 ENCOUNTER — Other Ambulatory Visit: Payer: Self-pay | Admitting: Family Medicine

## 2023-07-16 DIAGNOSIS — R0609 Other forms of dyspnea: Secondary | ICD-10-CM

## 2023-07-27 ENCOUNTER — Encounter: Payer: Self-pay | Admitting: Physician Assistant

## 2023-07-27 ENCOUNTER — Ambulatory Visit: Payer: Medicare Other | Attending: Physician Assistant | Admitting: Physician Assistant

## 2023-07-27 VITALS — BP 150/78 | HR 77 | Ht 69.0 in | Wt 174.0 lb

## 2023-07-27 DIAGNOSIS — I2583 Coronary atherosclerosis due to lipid rich plaque: Secondary | ICD-10-CM | POA: Diagnosis not present

## 2023-07-27 DIAGNOSIS — I10 Essential (primary) hypertension: Secondary | ICD-10-CM

## 2023-07-27 DIAGNOSIS — N1831 Chronic kidney disease, stage 3a: Secondary | ICD-10-CM | POA: Diagnosis not present

## 2023-07-27 DIAGNOSIS — R7989 Other specified abnormal findings of blood chemistry: Secondary | ICD-10-CM | POA: Diagnosis not present

## 2023-07-27 DIAGNOSIS — I4821 Permanent atrial fibrillation: Secondary | ICD-10-CM | POA: Diagnosis not present

## 2023-07-27 DIAGNOSIS — E782 Mixed hyperlipidemia: Secondary | ICD-10-CM

## 2023-07-27 DIAGNOSIS — I2510 Atherosclerotic heart disease of native coronary artery without angina pectoris: Secondary | ICD-10-CM | POA: Diagnosis not present

## 2023-07-27 NOTE — Patient Instructions (Signed)
Medication Instructions:  NO CHANGES *If you need a refill on your cardiac medications before your next appointment, please call your pharmacy*   Lab Work: NO LABS If you have labs (blood work) drawn today and your tests are completely normal, you will receive your results only by: MyChart Message (if you have MyChart) OR A paper copy in the mail If you have any lab test that is abnormal or we need to change your treatment, we will call you to review the results.   Testing/Procedures: NO TESTING   Follow-Up: At [facility], you and your health needs are our priority.  As part of our continuing mission to provide you with exceptional heart care, we have created designated Provider Care Teams.  These Care Teams include your primary Cardiologist (physician) and Advanced Practice Providers (APPs -  Physician Assistants and Nurse Practitioners) who all work together to provide you with the care you need, when you need it.    Your next appointment:   3 month(s)  Provider:   Nanetta Batty, MD

## 2023-07-27 NOTE — Progress Notes (Unsigned)
Cardiology Office Note:  .   Date:  07/28/2023  ID:  Edward [name], DOB 1930/02/01, MRN [id number] PCP: Lupita Raider, MD  [facility] Providers Cardiologist:  Nanetta Batty, MD Electrophysiologist:  Regan Lemming, MD     History of Present Illness: .   Edward [name] is a 89 y.o. male with PMH of HTN, HLD, permanent atrial fibrillation, CKD, PAD and CAD.  Patient had MI in October 2001 and had 2 bare-metal stent placed in his RCA.  He had renal artery stenting performed bilaterally by Dr. Charlett Nose on 06/18/2006.  Carotid Doppler showed moderate bilateral internal carotid artery stenosis with bilateral subclavian artery disease.  Historically, he has right arm pressure is 40 to 50 mmHg lower than the left side.  He had a witnessed syncopal episode for unclear reason.  Myoview obtained on 11/06/2015 showed EF 64%, small defect of moderate severity present in the basal inferior and mid inferior location this is likely due to diaphragmatic attenuation artifact.  Heart monitor obtained in October 2020 showed sinus rhythm with short run of PSVT and PACs.  Echocardiogram at the time was unrevealing.  Last carotid ultrasound obtained on 11/14/2022 demonstrated 40 to 59% right ICA stenosis, 1 to 39% left ICA stenosis, left vertebral artery demonstrated antegrade flow, right vertebral artery demonstrated retrograde flow.  Repeat study was recommended in 6 months.  Echocardiogram obtained on 11/19/2022 showed EF 55 to 60%, no regional wall motion abnormality, RVSP 47.7 mmHg, moderate LVE, severe right atrial enlargement, severe TR.  She was last seen by Dr. Allyson Sabal in February 2024 at which time he was doing well.  He was in rate controlled atrial fibrillation.  Patient presents today along with his older daughter.  Recent blood work obtained [facility] showed BNP 470.  When he was admitted at the hospital with pneumonia and acute heart failure in November 2023, his BNP was 700 on  arrival, improved to 470 prior to discharge.  It was 401 near the end of November 2023.  In the past 6 months, he denies any chest pain but does notice increasing dyspnea.  He is not taking Lasix daily, instead he is takes Lasix as needed.  When he has ankle edema, he usually take the Lasix for 3 days straight then stop after that.  He appears to be euvolemic on exam.  Given his age and poor renal function, I did not push for daily dosing of Lasix.  I compared the recent BMP with his previous BMP in 2023, there was no significant difference.  He has a history of pulmonary hypertension and tricuspid valve leakage, this likely contributed to the elevated BNP.  Given lack of chest pain, I encouraged him to increase exercise level.  We will reassess in 3 months.  I discussed with DOD Dr. Peter [place], we did not think repeating echocardiogram at this time offer him much benefit.  If symptoms continue to worsen in the next 3 months, may consider repeat echocardiogram when he returns.  ROS:   Patient complains of increasing dyspnea with activity but no chest pain, no significant lower extremity edema, orthopnea or PND  Studies Reviewed: Marland Kitchen   EKG Interpretation Date/Time:  Monday July 27 2023 14:22:14 EDT Ventricular Rate:  77 PR Interval:    QRS Duration:  136 QT Interval:  402 QTC Calculation: 454 R Axis:   -22  Text Interpretation: Atrial fibrillation Right bundle branch block When compared with ECG of 17-Sep-2022 02:28, PREVIOUS ECG  Cardiology Office Note:  .   Date:  07/28/2023  ID:  Edward [name], DOB 1930/02/01, MRN [id number] PCP: Lupita Raider, MD  [facility] Providers Cardiologist:  Nanetta Batty, MD Electrophysiologist:  Regan Lemming, MD     History of Present Illness: .   Edward [name] is a 89 y.o. male with PMH of HTN, HLD, permanent atrial fibrillation, CKD, PAD and CAD.  Patient had MI in October 2001 and had 2 bare-metal stent placed in his RCA.  He had renal artery stenting performed bilaterally by Dr. Charlett Nose on 06/18/2006.  Carotid Doppler showed moderate bilateral internal carotid artery stenosis with bilateral subclavian artery disease.  Historically, he has right arm pressure is 40 to 50 mmHg lower than the left side.  He had a witnessed syncopal episode for unclear reason.  Myoview obtained on 11/06/2015 showed EF 64%, small defect of moderate severity present in the basal inferior and mid inferior location this is likely due to diaphragmatic attenuation artifact.  Heart monitor obtained in October 2020 showed sinus rhythm with short run of PSVT and PACs.  Echocardiogram at the time was unrevealing.  Last carotid ultrasound obtained on 11/14/2022 demonstrated 40 to 59% right ICA stenosis, 1 to 39% left ICA stenosis, left vertebral artery demonstrated antegrade flow, right vertebral artery demonstrated retrograde flow.  Repeat study was recommended in 6 months.  Echocardiogram obtained on 11/19/2022 showed EF 55 to 60%, no regional wall motion abnormality, RVSP 47.7 mmHg, moderate LVE, severe right atrial enlargement, severe TR.  She was last seen by Dr. Allyson Sabal in February 2024 at which time he was doing well.  He was in rate controlled atrial fibrillation.  Patient presents today along with his older daughter.  Recent blood work obtained [facility] showed BNP 470.  When he was admitted at the hospital with pneumonia and acute heart failure in November 2023, his BNP was 700 on  arrival, improved to 470 prior to discharge.  It was 401 near the end of November 2023.  In the past 6 months, he denies any chest pain but does notice increasing dyspnea.  He is not taking Lasix daily, instead he is takes Lasix as needed.  When he has ankle edema, he usually take the Lasix for 3 days straight then stop after that.  He appears to be euvolemic on exam.  Given his age and poor renal function, I did not push for daily dosing of Lasix.  I compared the recent BMP with his previous BMP in 2023, there was no significant difference.  He has a history of pulmonary hypertension and tricuspid valve leakage, this likely contributed to the elevated BNP.  Given lack of chest pain, I encouraged him to increase exercise level.  We will reassess in 3 months.  I discussed with DOD Dr. Peter [place], we did not think repeating echocardiogram at this time offer him much benefit.  If symptoms continue to worsen in the next 3 months, may consider repeat echocardiogram when he returns.  ROS:   Patient complains of increasing dyspnea with activity but no chest pain, no significant lower extremity edema, orthopnea or PND  Studies Reviewed: Marland Kitchen   EKG Interpretation Date/Time:  Monday July 27 2023 14:22:14 EDT Ventricular Rate:  77 PR Interval:    QRS Duration:  136 QT Interval:  402 QTC Calculation: 454 R Axis:   -22  Text Interpretation: Atrial fibrillation Right bundle branch block When compared with ECG of 17-Sep-2022 02:28, PREVIOUS ECG  IS PRESENT Confirmed by Azalee Course [phone]) on 07/28/2023 6:19:12 PM    Cardiac Studies & Procedures     STRESS TESTS  MYOCARDIAL PERFUSION IMAGING 11/06/2015  Narrative  The left ventricular ejection fraction is normal (55-65%).  Nuclear stress EF: 64%.  Defect 1: There is a small defect of moderate severity present in the basal inferior and mid inferior location. This is likely due to diaphragmatic attenuation. This is less likely to represent prior  infarct.  The study is normal.  This is a low risk study.   ECHOCARDIOGRAM  ECHOCARDIOGRAM COMPLETE 11/19/2022  Narrative ECHOCARDIOGRAM REPORT    Patient Name:   Edward [name] [facility] Date of Exam: 11/19/2022 Medical Rec #:  [id number]      Height:       69.0 in Accession #:    [id number]     Weight:       166.2 lb Date of Birth:  30-Dec-1929     BSA:          1.910 m Patient Age:    89 years       BP:           124/76 mmHg Patient Gender: M              HR:           90 bpm. Exam Location:  [address]  Procedure: 2D Echo, Cardiac Doppler and Color Doppler  Indications:    I27.20 Pulmonary Hypertension  History:        Patient has prior history of Echocardiogram examinations, most recent 09/18/2022. CHF, CAD, Arrythmias:Atrial Fibrillation; Risk Factors:Hypertension and Dyslipidemia. Bilateral carotid/subclavian artery disease. SVT. Renal artery stenosis. Chronic kidney disease.  Sonographer:    Cathie Beams RCS Referring Phys: [id number] CALLIE E GOODRICH  IMPRESSIONS   1. Left ventricular ejection fraction, by estimation, is 55 to 60%. The left ventricle has normal function. The left ventricle has no regional wall motion abnormalities. There is mild concentric left ventricular hypertrophy. Left ventricular diastolic function could not be evaluated. 2. Right ventricular systolic function is moderately reduced. The right ventricular size is mildly enlarged. There is moderately elevated pulmonary artery systolic pressure. The estimated right ventricular systolic pressure is 47.7 mmHg. 3. Left atrial size was moderately dilated. 4. Right atrial size was severely dilated. 5. The mitral valve is grossly normal. Trivial mitral valve regurgitation. No evidence of mitral stenosis. 6. Tricuspid valve regurgitation is severe. 7. The aortic valve is tricuspid. There is mild calcification of the aortic valve. There is mild thickening of the aortic valve. Aortic valve regurgitation is not  visualized. Aortic valve sclerosis is present, with no evidence of aortic valve stenosis. 8. The inferior vena cava is dilated in size with <50% respiratory variability, suggesting right atrial pressure of 15 mmHg.  Comparison(s): Changes from prior study are noted. RVSP on current study 47.7 mmHg, prior 68.9 mmHg.  Conclusion(s)/Recommendation(s): Severe TR with elevated right sided pressures.  FINDINGS Left Ventricle: Left ventricular ejection fraction, by estimation, is 55 to 60%. The left ventricle has normal function. The left ventricle has no regional wall motion abnormalities. The left ventricular internal cavity size was normal in size. There is mild concentric left ventricular hypertrophy. Left ventricular diastolic function could not be evaluated due to atrial fibrillation. Left ventricular diastolic function could not be evaluated.  Right Ventricle: The right ventricular size is mildly enlarged. Right vetricular wall thickness was not well visualized. Right ventricular systolic function is moderately reduced. There is moderately  Cardiology Office Note:  .   Date:  07/28/2023  ID:  Edward [name], DOB 1930/02/01, MRN [id number] PCP: Lupita Raider, MD  [facility] Providers Cardiologist:  Nanetta Batty, MD Electrophysiologist:  Regan Lemming, MD     History of Present Illness: .   Edward [name] is a 89 y.o. male with PMH of HTN, HLD, permanent atrial fibrillation, CKD, PAD and CAD.  Patient had MI in October 2001 and had 2 bare-metal stent placed in his RCA.  He had renal artery stenting performed bilaterally by Dr. Charlett Nose on 06/18/2006.  Carotid Doppler showed moderate bilateral internal carotid artery stenosis with bilateral subclavian artery disease.  Historically, he has right arm pressure is 40 to 50 mmHg lower than the left side.  He had a witnessed syncopal episode for unclear reason.  Myoview obtained on 11/06/2015 showed EF 64%, small defect of moderate severity present in the basal inferior and mid inferior location this is likely due to diaphragmatic attenuation artifact.  Heart monitor obtained in October 2020 showed sinus rhythm with short run of PSVT and PACs.  Echocardiogram at the time was unrevealing.  Last carotid ultrasound obtained on 11/14/2022 demonstrated 40 to 59% right ICA stenosis, 1 to 39% left ICA stenosis, left vertebral artery demonstrated antegrade flow, right vertebral artery demonstrated retrograde flow.  Repeat study was recommended in 6 months.  Echocardiogram obtained on 11/19/2022 showed EF 55 to 60%, no regional wall motion abnormality, RVSP 47.7 mmHg, moderate LVE, severe right atrial enlargement, severe TR.  She was last seen by Dr. Allyson Sabal in February 2024 at which time he was doing well.  He was in rate controlled atrial fibrillation.  Patient presents today along with his older daughter.  Recent blood work obtained [facility] showed BNP 470.  When he was admitted at the hospital with pneumonia and acute heart failure in November 2023, his BNP was 700 on  arrival, improved to 470 prior to discharge.  It was 401 near the end of November 2023.  In the past 6 months, he denies any chest pain but does notice increasing dyspnea.  He is not taking Lasix daily, instead he is takes Lasix as needed.  When he has ankle edema, he usually take the Lasix for 3 days straight then stop after that.  He appears to be euvolemic on exam.  Given his age and poor renal function, I did not push for daily dosing of Lasix.  I compared the recent BMP with his previous BMP in 2023, there was no significant difference.  He has a history of pulmonary hypertension and tricuspid valve leakage, this likely contributed to the elevated BNP.  Given lack of chest pain, I encouraged him to increase exercise level.  We will reassess in 3 months.  I discussed with DOD Dr. Peter [place], we did not think repeating echocardiogram at this time offer him much benefit.  If symptoms continue to worsen in the next 3 months, may consider repeat echocardiogram when he returns.  ROS:   Patient complains of increasing dyspnea with activity but no chest pain, no significant lower extremity edema, orthopnea or PND  Studies Reviewed: Marland Kitchen   EKG Interpretation Date/Time:  Monday July 27 2023 14:22:14 EDT Ventricular Rate:  77 PR Interval:    QRS Duration:  136 QT Interval:  402 QTC Calculation: 454 R Axis:   -22  Text Interpretation: Atrial fibrillation Right bundle branch block When compared with ECG of 17-Sep-2022 02:28, PREVIOUS ECG

## 2023-07-31 ENCOUNTER — Other Ambulatory Visit: Payer: Self-pay | Admitting: Adult Health

## 2023-09-07 DIAGNOSIS — Z0000 Encounter for general adult medical examination without abnormal findings: Secondary | ICD-10-CM | POA: Diagnosis not present

## 2023-09-07 DIAGNOSIS — E782 Mixed hyperlipidemia: Secondary | ICD-10-CM | POA: Diagnosis not present

## 2023-09-07 DIAGNOSIS — Z23 Encounter for immunization: Secondary | ICD-10-CM | POA: Diagnosis not present

## 2023-09-07 DIAGNOSIS — I779 Disorder of arteries and arterioles, unspecified: Secondary | ICD-10-CM | POA: Diagnosis not present

## 2023-09-07 DIAGNOSIS — I4891 Unspecified atrial fibrillation: Secondary | ICD-10-CM | POA: Diagnosis not present

## 2023-09-07 DIAGNOSIS — I272 Pulmonary hypertension, unspecified: Secondary | ICD-10-CM | POA: Diagnosis not present

## 2023-09-07 DIAGNOSIS — I701 Atherosclerosis of renal artery: Secondary | ICD-10-CM | POA: Diagnosis not present

## 2023-09-07 DIAGNOSIS — I150 Renovascular hypertension: Secondary | ICD-10-CM | POA: Diagnosis not present

## 2023-09-07 DIAGNOSIS — I25119 Atherosclerotic heart disease of native coronary artery with unspecified angina pectoris: Secondary | ICD-10-CM | POA: Diagnosis not present

## 2023-09-07 DIAGNOSIS — N183 Chronic kidney disease, stage 3 unspecified: Secondary | ICD-10-CM | POA: Diagnosis not present

## 2023-09-07 DIAGNOSIS — D649 Anemia, unspecified: Secondary | ICD-10-CM | POA: Diagnosis not present

## 2023-09-07 DIAGNOSIS — D6869 Other thrombophilia: Secondary | ICD-10-CM | POA: Diagnosis not present

## 2023-09-07 LAB — LAB REPORT - SCANNED: EGFR: 38

## 2023-10-31 ENCOUNTER — Other Ambulatory Visit: Payer: Self-pay | Admitting: Student

## 2023-11-10 ENCOUNTER — Ambulatory Visit: Payer: Medicare Other | Admitting: Cardiovascular Disease

## 2023-11-11 ENCOUNTER — Other Ambulatory Visit: Payer: Self-pay | Admitting: Cardiovascular Disease

## 2023-11-11 ENCOUNTER — Other Ambulatory Visit: Payer: Self-pay

## 2023-11-11 MED ORDER — CLONIDINE HCL 0.1 MG PO TABS: 0.2000 mg | ORAL_TABLET | Freq: Every day | ORAL | 2 refills | Status: DC

## 2024-01-07 ENCOUNTER — Other Ambulatory Visit: Payer: Self-pay | Admitting: Cardiovascular Disease

## 2024-01-07 DIAGNOSIS — I5032 Chronic diastolic (congestive) heart failure: Secondary | ICD-10-CM

## 2024-03-03 DIAGNOSIS — N183 Chronic kidney disease, stage 3 unspecified: Secondary | ICD-10-CM | POA: Diagnosis not present

## 2024-03-03 DIAGNOSIS — I4891 Unspecified atrial fibrillation: Secondary | ICD-10-CM | POA: Diagnosis not present

## 2024-03-03 DIAGNOSIS — E782 Mixed hyperlipidemia: Secondary | ICD-10-CM | POA: Diagnosis not present

## 2024-03-03 DIAGNOSIS — Z23 Encounter for immunization: Secondary | ICD-10-CM | POA: Diagnosis not present

## 2024-03-03 DIAGNOSIS — R600 Localized edema: Secondary | ICD-10-CM | POA: Diagnosis not present

## 2024-03-03 DIAGNOSIS — D649 Anemia, unspecified: Secondary | ICD-10-CM | POA: Diagnosis not present

## 2024-03-03 DIAGNOSIS — I150 Renovascular hypertension: Secondary | ICD-10-CM | POA: Diagnosis not present

## 2024-03-03 DIAGNOSIS — I25119 Atherosclerotic heart disease of native coronary artery with unspecified angina pectoris: Secondary | ICD-10-CM | POA: Diagnosis not present

## 2024-04-13 ENCOUNTER — Other Ambulatory Visit: Payer: Self-pay | Admitting: Cardiovascular Disease

## 2024-07-18 DIAGNOSIS — Z789 Other specified health status: Secondary | ICD-10-CM | POA: Diagnosis not present

## 2024-07-18 DIAGNOSIS — L820 Inflamed seborrheic keratosis: Secondary | ICD-10-CM | POA: Diagnosis not present

## 2024-07-18 DIAGNOSIS — L570 Actinic keratosis: Secondary | ICD-10-CM | POA: Diagnosis not present

## 2024-07-18 DIAGNOSIS — L988 Other specified disorders of the skin and subcutaneous tissue: Secondary | ICD-10-CM | POA: Diagnosis not present

## 2024-07-18 DIAGNOSIS — L538 Other specified erythematous conditions: Secondary | ICD-10-CM | POA: Diagnosis not present

## 2024-08-10 ENCOUNTER — Telehealth: Payer: Self-pay | Admitting: Cardiovascular Disease

## 2024-08-10 NOTE — Telephone Encounter (Signed)
*  STAT* If patient is at the pharmacy, call can be transferred to refill team.   1. Which medications need to be refilled? (please list name of each medication and dose if known)   cloNIDine  (CATAPRES ) 0.1 MG tablet    2. Which pharmacy/location (including street and city if local pharmacy) is medication to be sent to? [id number] - [place], [place] - [address] Phone: [phone]  Fax: [phone]      3. Do they need a 30 day or 90 day supply? 90  Pt has 3 days left

## 2024-08-12 ENCOUNTER — Other Ambulatory Visit: Payer: Self-pay | Admitting: Cardiovascular Disease

## 2024-08-15 ENCOUNTER — Ambulatory Visit: Attending: Cardiovascular Disease | Admitting: Cardiovascular Disease

## 2024-08-15 ENCOUNTER — Encounter: Payer: Self-pay | Admitting: Cardiovascular Disease

## 2024-08-15 VITALS — BP 128/68 | HR 88 | Ht 69.0 in | Wt 172.8 lb

## 2024-08-15 DIAGNOSIS — I2583 Coronary atherosclerosis due to lipid rich plaque: Secondary | ICD-10-CM

## 2024-08-15 DIAGNOSIS — E782 Mixed hyperlipidemia: Secondary | ICD-10-CM | POA: Diagnosis not present

## 2024-08-15 DIAGNOSIS — I4821 Permanent atrial fibrillation: Secondary | ICD-10-CM | POA: Diagnosis not present

## 2024-08-15 DIAGNOSIS — I701 Atherosclerosis of renal artery: Secondary | ICD-10-CM | POA: Diagnosis not present

## 2024-08-15 DIAGNOSIS — I7025 Atherosclerosis of native arteries of other extremities with ulceration: Secondary | ICD-10-CM | POA: Diagnosis not present

## 2024-08-15 DIAGNOSIS — I272 Pulmonary hypertension, unspecified: Secondary | ICD-10-CM

## 2024-08-15 DIAGNOSIS — I6521 Occlusion and stenosis of right carotid artery: Secondary | ICD-10-CM | POA: Diagnosis not present

## 2024-08-15 DIAGNOSIS — I10 Essential (primary) hypertension: Secondary | ICD-10-CM | POA: Diagnosis not present

## 2024-08-15 DIAGNOSIS — I2510 Atherosclerotic heart disease of native coronary artery without angina pectoris: Secondary | ICD-10-CM

## 2024-08-15 MED ORDER — CLONIDINE HCL 0.1 MG PO TABS: 0.2000 mg | ORAL_TABLET | Freq: Every day | ORAL | 3 refills | Status: AC

## 2024-08-15 MED ORDER — METOPROLOL TARTRATE 100 MG PO TABS: 100.0000 mg | ORAL_TABLET | Freq: Two times a day (BID) | ORAL | 3 refills | Status: AC

## 2024-08-15 NOTE — Patient Instructions (Signed)
 Medication Instructions:  Your physician recommends that you continue on your current medications as directed. Please refer to the Current Medication list given to you today.  *If you need a refill on your cardiac medications before your next appointment, please call your pharmacy*   Follow-Up: At [facility], you and your health needs are our priority.  As part of our continuing mission to provide you with exceptional heart care, our providers are all part of one team.  This team includes your primary Cardiologist (physician) and Advanced Practice Providers or APPs (Physician Assistants and Nurse Practitioners) who all work together to provide you with the care you need, when you need it.  Your next appointment:   6 month(s)  Provider:   Damien Braver, NP         Then, Dorn Lesches, MD will plan to see you again in 12 month(s).

## 2024-08-15 NOTE — Assessment & Plan Note (Signed)
 History of moderate right ICA stenosis by duplex ultrasound 11/14/2022 with retrograde right vertebral filling.  He apparently has had a significant upper extremity blood pressure differential suggesting significant subclavian stenosis although he is asymptomatic from this.  No further testing required

## 2024-08-15 NOTE — Progress Notes (Signed)
 08/15/2024 [name]   [birth year]  [id number]  Primary Physician Edward Kins, MD Primary Cardiologist: Edward JINNY Lesches MD FACP, [place], [place], [place]  HPI:  Edward [name] is a 89 y.o.  married Caucasian male father of 2 children, grandfather of 5 grandchildren who worked at [employer]  in the past. I last saw him in the office 12/24/2022.  His wife Edward [name] is also a patient of mine who unfortunately has recent been diagnosed with Alzheimer's.  He lives with his wife and is independent.  They have full-time help.  He does continue to drive.  He is accompanied today by one of his daughters Edward [name].  His primary care physician is Dr. Suzen Edward. He was referred by Dr. Gordy [name] for peripheral vascular evaluation but has expressed a desire to follow up with me for his cardiology care as well in the future. He has a history of treated hypertension and hyperlipidemia. He has never smoked. Her father did have A. Myocardial infarction in his 70s. He had an myocardial function in October of 2001 and had 2 NIR bare metal stents placed in his RCA by Dr. Norleen [name] 08/20/00. He has had renal artery stenting bilaterally performed by Dr. Franky [name] 06/18/06. He had carotid Dopplers performed recently that showed moderate bilateral internal carotid artery stenosis with incidentally noted bilateral subclavian artery disease. His vertebral antegrade on the left and apparent on the right. There was a 40-50 mm pressure differential with the right arm being less than the left.  He does measure his blood pressure twice a day and has noticed evening hypertension.    He has lost hearing in his left ear on 05/08/2018 for unclear reasons.  Otherwise, he denies chest pain or shortness of breath.  He has had a UTI as well treated with ciprofloxacin  seen in the ER because of this.  His major complaints are palpitations however.     He has had a witnessed syncopal episode for unclear reasons.  A 2D echo was unrevealing  with normal LV systolic function and grade 2 diastolic dysfunction.  He had moderate pulmonary hypertension.  There is no evidence of valvular heart disease.  A 2-week Zio patch was ordered as well and was mailed in earlier this week.  He did have carotid Dopplers performed in January that showed moderate bilateral ICA stenosis with bilateral subclavian artery stenosis.  There was retrograde right vertebral filling with a 70 mm upper extremity blood pressure differential right lower than left.  Since I saw him in the office a year and a half ago he continues to do well.  He denies chest pain but has had some progressive dyspnea on exertion.  He really does not get out of the house much and is fairly sedentary.  He also denies dizziness.     Current Meds  Medication Sig   amLODipine  (NORVASC ) 2.5 MG tablet TAKE ONE TABLET (2.5MG ) BY MOUTH EACH DAY.   apixaban  (ELIQUIS ) 5 MG TABS tablet Take 1 tablet (5 mg total) by mouth 2 (two) times daily.   aspirin  EC 81 MG tablet Take 1 tablet (81 mg total) by mouth daily.   atorvastatin  (LIPITOR) 40 MG tablet TAKE 1 TABLET BY MOUTH EVERY DAY   Calcium  Citrate-Vitamin D (CITRACAL PETITES/VITAMIN D PO) Take 1 tablet by mouth every evening.   cloNIDine  (CATAPRES ) 0.1 MG tablet Take 2 tablets (0.2 mg total) by mouth daily.   Coenzyme Q10 (CO Q-10) 100 MG CAPS Take 100  mg by mouth 2 (two) times daily.   finasteride  (PROSCAR ) 5 MG tablet Take 5 mg by mouth daily.   folic acid  (FOLVITE ) 400 MCG tablet Take 400 mcg by mouth daily.   furosemide  (LASIX ) 20 MG tablet TAKE 1 TABLET (20MG ) ONCE DAILY. CAN TAKE AN EXTRA 20MG  AS NEEDED FOR WEIGHT GAIN (3LBS IN 1 DAY OR 1LBS IN 1 WEEK) OR WORSENING LOWER EXTREMITY EDEMA.   hydrocortisone (ANUSOL-HC) 2.5 % rectal cream Apply 1 Application topically 2 (two) times daily.   ipratropium (ATROVENT ) 0.06 % nasal spray Place 1 spray into both nostrils as needed.   Loratadine (CLARITIN PO) Take 1 capsule by mouth as needed.    LORazepam  (ATIVAN ) 1 MG tablet Take 1 tablet (1 mg total) by mouth in the morning and at bedtime.   metoprolol  tartrate (LOPRESSOR ) 100 MG tablet TAKE 1 TABLET BY MOUTH TWICE A DAY   Multiple Vitamins-Minerals (PRESERVISION AREDS 2) CAPS Take 1 capsule by mouth 2 (two) times daily.   Omega-3 Fatty Acids (FISH OIL) 1000 MG CAPS Take 1,000 mg by mouth in the morning and at bedtime.   Polyethyl Glycol-Propyl Glycol 0.4-0.3 % SOLN Apply 1 drop to eye at bedtime as needed (dryness).   tamsulosin  (FLOMAX ) 0.4 MG CAPS capsule Take 0.4 mg by mouth daily.     Allergies  Allergen Reactions   Amlodipine  Other (See Comments)    Tolerates 2.5 mg, higher doses cause LEE with blistering   Erythromycin    Penicillins    Prednisone     Social History   Socioeconomic History   Marital status: Married    Spouse name: Not on file   Number of children: Not on file   Years of education: Not on file   Highest education level: Not on file  Occupational History   Not on file  Tobacco Use   Smoking status: Never   Smokeless tobacco: Never  Vaping Use   Vaping status: Never Used  Substance and Sexual Activity   Alcohol use: No   Drug use: No   Sexual activity: Not on file  Other Topics Concern   Not on file  Social History Narrative   Not on file   Social Drivers of Health   Financial Resource Strain: Not on file  Food Insecurity: No Food Insecurity (09/17/2022)   Hunger Vital Sign    Worried About Running Out of Food in the Last Year: Never true    Ran Out of Food in the Last Year: Never true  Transportation Needs: No Transportation Needs (09/17/2022)   PRAPARE - Administrator, Civil Service (Medical): No    Lack of Transportation (Non-Medical): No  Physical Activity: Not on file  Stress: Not on file  Social Connections: Not on file  Intimate Partner Violence: Not At Risk (09/17/2022)   Humiliation, Afraid, Rape, and Kick questionnaire    Fear of Current or Ex-Partner: No     Emotionally Abused: No    Physically Abused: No    Sexually Abused: No     Review of Systems: General: negative for chills, fever, night sweats or weight changes.  Cardiovascular: negative for chest pain, dyspnea on exertion, edema, orthopnea, palpitations, paroxysmal nocturnal dyspnea or shortness of breath Dermatological: negative for rash Respiratory: negative for cough or wheezing Urologic: negative for hematuria Abdominal: negative for nausea, vomiting, diarrhea, bright red blood per rectum, melena, or hematemesis Neurologic: negative for visual changes, syncope, or dizziness All other systems reviewed and are otherwise negative  except as noted above.    Blood pressure 128/68, pulse 88, height 5' 9 (1.753 m), weight 172 lb 12.8 oz (78.4 kg), SpO2 97%.  General appearance: alert and no distress Neck: no adenopathy, no JVD, supple, symmetrical, trachea midline, thyroid not enlarged, symmetric, no tenderness/mass/nodules, and right carotid bruit Lungs: clear to auscultation bilaterally Heart: irregularly irregular rhythm Extremities: extremities normal, atraumatic, no cyanosis or edema Pulses: 2+ and symmetric Skin: Skin color, texture, turgor normal. No rashes or lesions Neurologic: Grossly normal  EKG EKG Interpretation Date/Time:  Monday August 15 2024 16:07:02 EDT Ventricular Rate:  88 PR Interval:    QRS Duration:  132 QT Interval:  386 QTC Calculation: 467 R Axis:   -17  Text Interpretation: Atrial fibrillation Right bundle branch block When compared with ECG of 27-Jul-2023 14:22, Previous ECG has undetermined rhythm, needs review Confirmed by Court Carrier [id number]) on 08/15/2024 4:17:58 PM    ASSESSMENT AND PLAN:   Coronary artery disease due to lipid rich plaque History of CAD status post myocardial infarction in his 58s.  He had a heart attack October 2001 and had 2 NIR  bare-metal stents placed in his RCA by Dr. Norleen [name] (08/20/2000).  He currently  denies chest pain.  Mixed hyperlipidemia History of hyperlipidemia on statin therapy with lipid profile performed 11//24 revealing total cholesterol 117, LDL of 60 and HDL of 38.  Carotid artery stenosis History of moderate right ICA stenosis by duplex ultrasound 11/14/2022 with retrograde right vertebral filling.  He apparently has had a significant upper extremity blood pressure differential suggesting significant subclavian stenosis although he is asymptomatic from this.  No further testing required  Essential hypertension History of essential hypertension with blood pressure measured today 128/68.  He is on low-dose amlodipine  and metoprolol .  Renal artery stenosis History of renal artery stenosis status post stenting by Dr. Franky Freed bilaterally 06/18/2006.SABRA  Permanent atrial fibrillation (HCC) Permanent A-fib rate controlled on Eliquis  oral anticoagulation.  Pulmonary HTN (HCC) 2D echocardiogram performed 11/19/2022 revealed normal LV systolic function, moderate pulmonary hypertension and severe TR.     Carrier DOROTHA Court MD FACP,FACC,FAHA, [facility] 08/15/2024 4:35 PM

## 2024-08-15 NOTE — Assessment & Plan Note (Signed)
 2D echocardiogram performed 11/19/2022 revealed normal LV systolic function, moderate pulmonary hypertension and severe TR.

## 2024-08-15 NOTE — Assessment & Plan Note (Signed)
 History of CAD status post myocardial infarction in his 68s.  He had a heart attack October 2001 and had 2 NIR  bare-metal stents placed in his RCA by Dr. Norleen Norlander (08/20/2000).  He currently denies chest pain.

## 2024-08-15 NOTE — Assessment & Plan Note (Signed)
 Permanent A-fib rate controlled on Eliquis  oral anticoagulation.

## 2024-08-15 NOTE — Assessment & Plan Note (Signed)
 History of essential hypertension with blood pressure measured today 128/68.  He is on low-dose amlodipine  and metoprolol .

## 2024-08-15 NOTE — Assessment & Plan Note (Signed)
 History of renal artery stenosis status post stenting by Dr. Franky Freed bilaterally 06/18/2006.SABRA

## 2024-08-15 NOTE — Assessment & Plan Note (Signed)
 History of hyperlipidemia on statin therapy with lipid profile performed 11//24 revealing total cholesterol 117, LDL of 60 and HDL of 38.

## 2024-09-08 LAB — LAB REPORT - SCANNED: EGFR: 38

## 2024-09-12 ENCOUNTER — Ambulatory Visit: Payer: Self-pay | Admitting: Cardiovascular Disease
